# Patient Record
Sex: Female | Born: 1950
Health system: Southern US, Community
[De-identification: ages and names within clinical notes are randomized; demographics above are authoritative.]

## PROBLEM LIST (undated history)

## (undated) DIAGNOSIS — R7989 Other specified abnormal findings of blood chemistry: Secondary | ICD-10-CM

## (undated) DIAGNOSIS — J45909 Unspecified asthma, uncomplicated: Secondary | ICD-10-CM

## (undated) DIAGNOSIS — M545 Low back pain, unspecified: Secondary | ICD-10-CM

## (undated) DIAGNOSIS — E785 Hyperlipidemia, unspecified: Secondary | ICD-10-CM

## (undated) DIAGNOSIS — R945 Abnormal results of liver function studies: Secondary | ICD-10-CM

## (undated) DIAGNOSIS — M25561 Pain in right knee: Secondary | ICD-10-CM

## (undated) DIAGNOSIS — K219 Gastro-esophageal reflux disease without esophagitis: Secondary | ICD-10-CM

## (undated) DIAGNOSIS — N924 Excessive bleeding in the premenopausal period: Secondary | ICD-10-CM

## (undated) DIAGNOSIS — D509 Iron deficiency anemia, unspecified: Secondary | ICD-10-CM

## (undated) DIAGNOSIS — L83 Acanthosis nigricans: Secondary | ICD-10-CM

## (undated) DIAGNOSIS — Z8742 Personal history of other diseases of the female genital tract: Secondary | ICD-10-CM

## (undated) DIAGNOSIS — R7303 Prediabetes: Secondary | ICD-10-CM

## (undated) DIAGNOSIS — K259 Gastric ulcer, unspecified as acute or chronic, without hemorrhage or perforation: Secondary | ICD-10-CM

## (undated) DIAGNOSIS — I10 Essential (primary) hypertension: Secondary | ICD-10-CM

## (undated) DIAGNOSIS — K254 Chronic or unspecified gastric ulcer with hemorrhage: Secondary | ICD-10-CM

## (undated) DIAGNOSIS — E669 Obesity, unspecified: Secondary | ICD-10-CM

## (undated) DIAGNOSIS — N840 Polyp of corpus uteri: Secondary | ICD-10-CM

## (undated) DIAGNOSIS — K635 Polyp of colon: Secondary | ICD-10-CM

## (undated) HISTORY — DX: Unspecified asthma, uncomplicated: J45.909

## (undated) HISTORY — DX: Hyperlipidemia, unspecified: E78.5

## (undated) HISTORY — DX: Excessive bleeding in the premenopausal period: N92.4

## (undated) HISTORY — DX: Acanthosis nigricans: L83

## (undated) HISTORY — DX: Iron deficiency anemia, unspecified: D50.9

## (undated) HISTORY — DX: Low back pain, unspecified: M54.50

## (undated) HISTORY — DX: Personal history of other diseases of the female genital tract: Z87.42

## (undated) HISTORY — DX: Polyp of colon: K63.5

## (undated) HISTORY — DX: Essential (primary) hypertension: I10

## (undated) HISTORY — DX: Low back pain: M54.5

## (undated) HISTORY — DX: Pain in right knee: M25.561

## (undated) HISTORY — DX: Chronic or unspecified gastric ulcer with hemorrhage: K25.4

## (undated) HISTORY — DX: Other specified abnormal findings of blood chemistry: R79.89

## (undated) HISTORY — DX: Polyp of corpus uteri: N84.0

## (undated) HISTORY — DX: Obesity, unspecified: E66.9

## (undated) HISTORY — DX: Gastric ulcer, unspecified as acute or chronic, without hemorrhage or perforation: K25.9

## (undated) HISTORY — DX: Abnormal results of liver function studies: R94.5

## (undated) HISTORY — PX: COLONOSCOPY: SHX174

---

## 1973-06-03 HISTORY — PX: THERAPEUTIC ABORTION: SHX798

## 1999-02-06 ENCOUNTER — Inpatient Hospital Stay (HOSPITAL_COMMUNITY): Admission: AD | Admit: 1999-02-06 | Discharge: 1999-02-06 | Payer: Self-pay | Admitting: *Deleted

## 1999-03-01 ENCOUNTER — Other Ambulatory Visit: Admission: RE | Admit: 1999-03-01 | Discharge: 1999-03-01 | Payer: Self-pay | Admitting: *Deleted

## 1999-03-01 ENCOUNTER — Encounter: Admission: RE | Admit: 1999-03-01 | Discharge: 1999-03-01 | Payer: Self-pay | Admitting: Obstetrics

## 1999-03-13 ENCOUNTER — Encounter: Admission: RE | Admit: 1999-03-13 | Discharge: 1999-03-13 | Payer: Self-pay | Admitting: Obstetrics & Gynecology

## 2000-12-12 ENCOUNTER — Encounter: Payer: Self-pay | Admitting: Emergency Medicine

## 2000-12-12 ENCOUNTER — Emergency Department (HOSPITAL_COMMUNITY): Admission: EM | Admit: 2000-12-12 | Discharge: 2000-12-12 | Payer: Self-pay | Admitting: Emergency Medicine

## 2001-05-28 ENCOUNTER — Encounter: Admission: RE | Admit: 2001-05-28 | Discharge: 2001-05-28 | Payer: Self-pay

## 2001-06-23 ENCOUNTER — Encounter: Admission: RE | Admit: 2001-06-23 | Discharge: 2001-06-23 | Payer: Self-pay | Admitting: Obstetrics & Gynecology

## 2001-06-24 ENCOUNTER — Other Ambulatory Visit: Admission: RE | Admit: 2001-06-24 | Discharge: 2001-06-24 | Payer: Self-pay | Admitting: Obstetrics

## 2001-06-25 ENCOUNTER — Encounter: Admission: RE | Admit: 2001-06-25 | Discharge: 2001-06-25 | Payer: Self-pay | Admitting: Internal Medicine

## 2002-05-10 ENCOUNTER — Encounter: Admission: RE | Admit: 2002-05-10 | Discharge: 2002-05-10 | Payer: Self-pay | Admitting: Internal Medicine

## 2003-01-26 ENCOUNTER — Encounter: Admission: RE | Admit: 2003-01-26 | Discharge: 2003-01-26 | Payer: Self-pay | Admitting: Internal Medicine

## 2003-02-01 ENCOUNTER — Encounter: Admission: RE | Admit: 2003-02-01 | Discharge: 2003-02-01 | Payer: Self-pay | Admitting: Internal Medicine

## 2004-03-29 ENCOUNTER — Ambulatory Visit: Payer: Self-pay | Admitting: Internal Medicine

## 2004-04-16 ENCOUNTER — Ambulatory Visit: Payer: Self-pay | Admitting: Internal Medicine

## 2004-04-23 ENCOUNTER — Ambulatory Visit: Payer: Self-pay | Admitting: Internal Medicine

## 2004-05-15 ENCOUNTER — Ambulatory Visit (HOSPITAL_COMMUNITY): Admission: RE | Admit: 2004-05-15 | Discharge: 2004-05-15 | Payer: Self-pay | Admitting: Internal Medicine

## 2004-06-18 ENCOUNTER — Ambulatory Visit: Payer: Self-pay | Admitting: Internal Medicine

## 2004-11-05 ENCOUNTER — Ambulatory Visit: Payer: Self-pay | Admitting: Internal Medicine

## 2005-04-05 ENCOUNTER — Encounter (INDEPENDENT_AMBULATORY_CARE_PROVIDER_SITE_OTHER): Payer: Self-pay | Admitting: Ophthalmology

## 2005-04-05 ENCOUNTER — Ambulatory Visit: Payer: Self-pay | Admitting: Internal Medicine

## 2005-04-05 LAB — CONVERTED CEMR LAB: Pap Smear: NORMAL

## 2005-04-09 ENCOUNTER — Ambulatory Visit: Payer: Self-pay | Admitting: Internal Medicine

## 2005-04-11 ENCOUNTER — Ambulatory Visit: Payer: Self-pay | Admitting: Internal Medicine

## 2005-05-07 ENCOUNTER — Ambulatory Visit: Payer: Self-pay | Admitting: *Deleted

## 2005-05-07 ENCOUNTER — Other Ambulatory Visit: Admission: RE | Admit: 2005-05-07 | Discharge: 2005-05-07 | Payer: Self-pay | Admitting: Obstetrics and Gynecology

## 2005-05-07 ENCOUNTER — Encounter (INDEPENDENT_AMBULATORY_CARE_PROVIDER_SITE_OTHER): Payer: Self-pay | Admitting: Specialist

## 2005-05-10 ENCOUNTER — Encounter (INDEPENDENT_AMBULATORY_CARE_PROVIDER_SITE_OTHER): Payer: Self-pay | Admitting: *Deleted

## 2005-05-10 ENCOUNTER — Ambulatory Visit: Payer: Self-pay | Admitting: Internal Medicine

## 2005-05-10 LAB — HM COLONOSCOPY: HM Colonoscopy: ABNORMAL

## 2005-05-20 ENCOUNTER — Ambulatory Visit: Payer: Self-pay | Admitting: Internal Medicine

## 2005-07-16 ENCOUNTER — Ambulatory Visit (HOSPITAL_COMMUNITY): Admission: RE | Admit: 2005-07-16 | Discharge: 2005-07-16 | Payer: Self-pay | Admitting: Internal Medicine

## 2005-10-22 ENCOUNTER — Ambulatory Visit: Payer: Self-pay | Admitting: Internal Medicine

## 2006-03-11 ENCOUNTER — Ambulatory Visit: Payer: Self-pay | Admitting: Internal Medicine

## 2006-03-25 ENCOUNTER — Ambulatory Visit: Payer: Self-pay | Admitting: Internal Medicine

## 2006-03-25 ENCOUNTER — Encounter (INDEPENDENT_AMBULATORY_CARE_PROVIDER_SITE_OTHER): Payer: Self-pay | Admitting: Ophthalmology

## 2006-03-25 LAB — CONVERTED CEMR LAB
BUN: 13 mg/dL (ref 6–23)
CO2: 29 meq/L (ref 19–32)
Calcium: 9.3 mg/dL (ref 8.4–10.5)
Chloride: 104 meq/L (ref 96–112)
Cholesterol: 197 mg/dL (ref 0–200)
Creatinine, Ser: 0.96 mg/dL (ref 0.40–1.20)
Ferritin: 43 ng/mL (ref 10–291)
Glucose, Bld: 93 mg/dL (ref 70–99)
HCT: 38.9 % (ref 36.0–46.0)
HDL: 55 mg/dL (ref >39)
Hemoglobin: 12.5 g/dL (ref 12.0–15.0)
LDL Cholesterol: 115 mg/dL — ABNORMAL HIGH (ref 0–99)
Leukocyte count, blood: 5.8 10*9/L (ref 4.0–10.5)
MCHC: 32.1 g/dL (ref 30.0–36.0)
MCV: 88.8 fL (ref 78.0–100.0)
Platelets: 306 10*3/uL (ref 150–400)
Potassium: 4.2 meq/L (ref 3.5–5.3)
RBC: 4.38 M/uL (ref 3.87–5.11)
RDW: 14.4 % — ABNORMAL HIGH (ref 11.5–14.0)
Sodium: 141 meq/L (ref 135–145)
Total CHOL/HDL Ratio: 3.6
Triglycerides: 135 mg/dL (ref <150)
VLDL: 27 mg/dL (ref 0–40)

## 2006-05-22 ENCOUNTER — Encounter (INDEPENDENT_AMBULATORY_CARE_PROVIDER_SITE_OTHER): Payer: Self-pay | Admitting: Ophthalmology

## 2006-05-22 DIAGNOSIS — E785 Hyperlipidemia, unspecified: Secondary | ICD-10-CM | POA: Insufficient documentation

## 2006-05-22 DIAGNOSIS — Z862 Personal history of diseases of the blood and blood-forming organs and certain disorders involving the immune mechanism: Secondary | ICD-10-CM

## 2006-05-22 DIAGNOSIS — I10 Essential (primary) hypertension: Secondary | ICD-10-CM

## 2006-05-22 DIAGNOSIS — Z6841 Body Mass Index (BMI) 40.0 and over, adult: Secondary | ICD-10-CM

## 2006-05-22 DIAGNOSIS — N949 Unspecified condition associated with female genital organs and menstrual cycle: Secondary | ICD-10-CM

## 2007-04-09 ENCOUNTER — Ambulatory Visit: Payer: Self-pay | Admitting: Internal Medicine

## 2007-04-09 LAB — CONVERTED CEMR LAB: Blood Glucose, Fingerstick: 110

## 2007-05-06 ENCOUNTER — Ambulatory Visit (HOSPITAL_COMMUNITY): Admission: RE | Admit: 2007-05-06 | Discharge: 2007-05-06 | Payer: Self-pay | Admitting: Internal Medicine

## 2007-06-10 ENCOUNTER — Ambulatory Visit: Payer: Self-pay | Admitting: Obstetrics and Gynecology

## 2007-06-16 ENCOUNTER — Ambulatory Visit (HOSPITAL_COMMUNITY): Admission: RE | Admit: 2007-06-16 | Discharge: 2007-06-16 | Payer: Self-pay | Admitting: Obstetrics & Gynecology

## 2007-07-14 ENCOUNTER — Encounter: Payer: Self-pay | Admitting: Obstetrics and Gynecology

## 2007-07-14 ENCOUNTER — Ambulatory Visit: Payer: Self-pay | Admitting: Obstetrics and Gynecology

## 2007-07-14 ENCOUNTER — Ambulatory Visit (HOSPITAL_COMMUNITY): Admission: RE | Admit: 2007-07-14 | Discharge: 2007-07-14 | Payer: Self-pay | Admitting: Obstetrics and Gynecology

## 2007-07-30 ENCOUNTER — Ambulatory Visit: Payer: Self-pay | Admitting: Obstetrics and Gynecology

## 2008-03-10 ENCOUNTER — Telehealth (INDEPENDENT_AMBULATORY_CARE_PROVIDER_SITE_OTHER): Payer: Self-pay | Admitting: Internal Medicine

## 2008-03-15 ENCOUNTER — Telehealth (INDEPENDENT_AMBULATORY_CARE_PROVIDER_SITE_OTHER): Payer: Self-pay | Admitting: Internal Medicine

## 2008-04-04 ENCOUNTER — Encounter: Payer: Self-pay | Admitting: Internal Medicine

## 2008-04-04 ENCOUNTER — Ambulatory Visit: Payer: Self-pay | Admitting: *Deleted

## 2008-04-04 ENCOUNTER — Encounter (INDEPENDENT_AMBULATORY_CARE_PROVIDER_SITE_OTHER): Payer: Self-pay | Admitting: Internal Medicine

## 2008-04-04 DIAGNOSIS — M5416 Radiculopathy, lumbar region: Secondary | ICD-10-CM | POA: Insufficient documentation

## 2008-04-06 LAB — CONVERTED CEMR LAB
ALT: 30 units/L (ref 0–35)
AST: 28 units/L (ref 0–37)
Albumin: 4 g/dL (ref 3.5–5.2)
Alkaline Phosphatase: 79 units/L (ref 39–117)
BUN: 13 mg/dL (ref 6–23)
Basophils Absolute: 0 10*3/uL (ref 0.0–0.1)
Basophils Relative: 0 % (ref 0–1)
CO2: 26 meq/L (ref 19–32)
Calcium: 9.4 mg/dL (ref 8.4–10.5)
Chloride: 102 meq/L (ref 96–112)
Cholesterol: 212 mg/dL — ABNORMAL HIGH (ref 0–200)
Creatinine, Ser: 0.89 mg/dL (ref 0.40–1.20)
Eosinophils Absolute: 0.3 10*3/uL (ref 0.0–0.7)
Eosinophils Relative: 4 % (ref 0–5)
Glucose, Bld: 104 mg/dL — ABNORMAL HIGH (ref 70–99)
HCT: 37.2 % (ref 36.0–46.0)
HDL: 52 mg/dL (ref >39)
Hemoglobin: 11.5 g/dL — ABNORMAL LOW (ref 12.0–15.0)
LDL Cholesterol: 133 mg/dL — ABNORMAL HIGH (ref 0–99)
Lymphocytes Relative: 34 % (ref 12–46)
Lymphs Abs: 2.4 10*3/uL (ref 0.7–4.0)
MCHC: 30.9 g/dL (ref 30.0–36.0)
MCV: 89.6 fL (ref 78.0–100.0)
Monocytes Absolute: 0.6 10*3/uL (ref 0.1–1.0)
Monocytes Relative: 9 % (ref 3–12)
Neutro Abs: 3.7 10*3/uL (ref 1.7–7.7)
Neutrophils Relative %: 52 % (ref 43–77)
Platelets: 279 10*3/uL (ref 150–400)
Potassium: 3.8 meq/L (ref 3.5–5.3)
RBC: 4.15 M/uL (ref 3.87–5.11)
RDW: 14.6 % (ref 11.5–15.5)
Sodium: 142 meq/L (ref 135–145)
TSH: 1.839 microintl units/mL (ref 0.350–4.50)
Total Bilirubin: 0.6 mg/dL (ref 0.3–1.2)
Total CHOL/HDL Ratio: 4.1
Total Protein: 6.9 g/dL (ref 6.0–8.3)
Triglycerides: 134 mg/dL (ref <150)
VLDL: 27 mg/dL (ref 0–40)
WBC: 7 10*3/uL (ref 4.0–10.5)

## 2008-05-13 ENCOUNTER — Ambulatory Visit (HOSPITAL_COMMUNITY): Admission: RE | Admit: 2008-05-13 | Discharge: 2008-05-13 | Payer: Self-pay | Admitting: Internal Medicine

## 2008-09-01 ENCOUNTER — Encounter (INDEPENDENT_AMBULATORY_CARE_PROVIDER_SITE_OTHER): Payer: Self-pay | Admitting: Internal Medicine

## 2008-09-01 ENCOUNTER — Ambulatory Visit: Payer: Self-pay | Admitting: Internal Medicine

## 2008-09-01 LAB — HM PAP SMEAR: HM Pap smear: NEGATIVE

## 2008-09-01 LAB — CONVERTED CEMR LAB: Pap Smear: ABSENT

## 2008-09-05 LAB — CONVERTED CEMR LAB
Cholesterol: 219 mg/dL — ABNORMAL HIGH (ref 0–200)
HDL: 56 mg/dL (ref >39)
LDL Cholesterol: 143 mg/dL — ABNORMAL HIGH (ref 0–99)
Total CHOL/HDL Ratio: 3.9
Triglycerides: 98 mg/dL (ref <150)
VLDL: 20 mg/dL (ref 0–40)

## 2008-09-06 ENCOUNTER — Telehealth (INDEPENDENT_AMBULATORY_CARE_PROVIDER_SITE_OTHER): Payer: Self-pay | Admitting: Internal Medicine

## 2008-12-12 ENCOUNTER — Encounter (INDEPENDENT_AMBULATORY_CARE_PROVIDER_SITE_OTHER): Payer: Self-pay | Admitting: Internal Medicine

## 2008-12-12 ENCOUNTER — Ambulatory Visit: Payer: Self-pay | Admitting: Internal Medicine

## 2008-12-12 LAB — CONVERTED CEMR LAB
BUN: 13 mg/dL (ref 6–23)
CO2: 26 meq/L (ref 19–32)
Calcium: 9.3 mg/dL (ref 8.4–10.5)
Chloride: 103 meq/L (ref 96–112)
Cholesterol: 188 mg/dL (ref 0–200)
Creatinine, Ser: 0.89 mg/dL (ref 0.40–1.20)
Ferritin: 67 ng/mL (ref 10–291)
Glucose, Bld: 91 mg/dL (ref 70–99)
HCT: 39 % (ref 36.0–46.0)
HDL: 57 mg/dL (ref >39)
Hemoglobin: 12.6 g/dL (ref 12.0–15.0)
Iron: 62 ug/dL (ref 42–145)
LDL Cholesterol: 112 mg/dL — ABNORMAL HIGH (ref 0–99)
MCHC: 32.3 g/dL (ref 30.0–36.0)
MCV: 88.4 fL (ref 78.0–100.0)
Platelets: 287 10*3/uL (ref 150–400)
Potassium: 3.8 meq/L (ref 3.5–5.3)
RBC: 4.41 M/uL (ref 3.87–5.11)
RDW: 14.5 % (ref 11.5–15.5)
Sodium: 141 meq/L (ref 135–145)
TSH: 1.971 microintl units/mL (ref 0.350–4.500)
Total CHOL/HDL Ratio: 3.3
Triglycerides: 97 mg/dL (ref <150)
VLDL: 19 mg/dL (ref 0–40)
WBC: 8 10*3/uL (ref 4.0–10.5)

## 2009-04-10 ENCOUNTER — Telehealth (INDEPENDENT_AMBULATORY_CARE_PROVIDER_SITE_OTHER): Payer: Self-pay | Admitting: Internal Medicine

## 2009-05-01 ENCOUNTER — Telehealth (INDEPENDENT_AMBULATORY_CARE_PROVIDER_SITE_OTHER): Payer: Self-pay | Admitting: Internal Medicine

## 2009-06-12 ENCOUNTER — Ambulatory Visit: Payer: Self-pay | Admitting: Internal Medicine

## 2009-06-12 LAB — CONVERTED CEMR LAB
ALT: 27 units/L (ref 0–35)
AST: 29 units/L (ref 0–37)
Albumin: 4 g/dL (ref 3.5–5.2)
Alkaline Phosphatase: 79 units/L (ref 39–117)
BUN: 11 mg/dL (ref 6–23)
CO2: 24 meq/L (ref 19–32)
Calcium: 9.2 mg/dL (ref 8.4–10.5)
Chloride: 106 meq/L (ref 96–112)
Cholesterol: 195 mg/dL (ref 0–200)
Creatinine, Ser: 0.95 mg/dL (ref 0.40–1.20)
Ferritin: 50 ng/mL (ref 10–291)
Glucose, Bld: 94 mg/dL (ref 70–99)
HCT: 39.4 % (ref 36.0–46.0)
HDL: 57 mg/dL (ref >39)
Hemoglobin: 13.1 g/dL (ref 12.0–15.0)
Hgb A1c MFr Bld: 6.2 %
Iron: 54 ug/dL (ref 42–145)
LDL Cholesterol: 119 mg/dL — ABNORMAL HIGH (ref 0–99)
MCHC: 33.2 g/dL (ref 30.0–36.0)
MCV: 88.1 fL (ref >=78.0)
Platelets: 286 10*3/uL (ref 150–400)
Potassium: 4.1 meq/L (ref 3.5–5.3)
RBC: 4.47 M/uL (ref 3.87–5.11)
RDW: 14.3 % (ref 11.5–15.5)
Sodium: 144 meq/L (ref 135–145)
Total Bilirubin: 0.5 mg/dL (ref 0.3–1.2)
Total CHOL/HDL Ratio: 3.4
Total Protein: 7.1 g/dL (ref 6.0–8.3)
Triglycerides: 93 mg/dL (ref <150)
VLDL: 19 mg/dL (ref 0–40)
WBC: 7.3 10*3/uL (ref 4.0–10.5)

## 2009-07-25 ENCOUNTER — Ambulatory Visit (HOSPITAL_COMMUNITY): Admission: RE | Admit: 2009-07-25 | Discharge: 2009-07-25 | Payer: Self-pay | Admitting: Internal Medicine

## 2009-07-25 LAB — HM MAMMOGRAPHY: HM Mammogram: NEGATIVE

## 2010-03-14 ENCOUNTER — Ambulatory Visit: Payer: Self-pay | Admitting: Internal Medicine

## 2010-03-14 DIAGNOSIS — R1031 Right lower quadrant pain: Secondary | ICD-10-CM

## 2010-03-14 DIAGNOSIS — M542 Cervicalgia: Secondary | ICD-10-CM

## 2010-03-14 HISTORY — DX: Right lower quadrant pain: R10.31

## 2010-06-05 ENCOUNTER — Encounter: Payer: Self-pay | Admitting: Internal Medicine

## 2010-07-03 NOTE — Assessment & Plan Note (Signed)
Summary: EST-CK/FU/MED/CFB   Vital Signs:  Patient profile:   60 year old female Height:      66 inches (167.64 cm) Weight:      313.5 pounds (142.50 kg) BMI:     50.78 Temp:     97.4 degrees F (36.33 degrees C) oral Pulse rate:   80 / minute BP sitting:   136 / 85  (left arm) Cuff size:   WRIST/LG  Vitals Entered By: Theotis Barrio NT II (March 14, 2010 3:02 PM) CC: MEDICATION REFILL   /    RIGHT KNEE ACHE  / BACK OF NECK FOR ABOUT 2 WEEKS Is Patient Diabetic? No Pain Assessment Patient in pain? no      Nutritional Status BMI of > 30 = obese  Have you ever been in a relationship where you felt threatened, hurt or afraid?No   Does patient need assistance? Functional Status Self care Ambulation Normal   CC:  MEDICATION REFILL   /    RIGHT KNEE ACHE  / BACK OF NECK FOR ABOUT 2 WEEKS.  History of Present Illness: This is a 60 year old with a hx of htn, hyperlipidemia, and obesity who presents for routine follow up.   Pt last seen on 1/11 and had lost 12 ibs at that time with diet and exercise (weight at that time - 304 ibs now 313).   Pt is concerned about pain across her neck radiating over her shoulder blades.  Pt gets some relief when she props pillows behind her neck while sleeping.  The pain is mild and dull and has been occuring for the last two weeks.  Pt denies any numbness tingling, or loss of strength in her upper extremities.  Over the last 2.5 months, the patient has also experienced worsening right knee pain. The patient denies any history of trauma or knee swelling.  Pain is improved with rest and is worsened with weight bearing.  Pain improves with increased movement. The patient's pain is discribed as sharp 3-4 out of 10 in intensity and radiates down to the foot.  Pt states that she has gained weight over the last several months because she is unable to walk secondary to the pain.     Preventive Screening-Counseling & Management  Alcohol-Tobacco     Alcohol  drinks/day: 0     Smoking Status: quit     Year Quit: quit when 60yrs old  Caffeine-Diet-Exercise     Does Patient Exercise: no     Type of exercise: walking sometimes  Allergies: No Known Drug Allergies  Past History:  Past Medical History: Last updated: 05/22/2006 Anemia-iron deficiency    - post-menopausal bleeding; followed by Dr. Okey Dupre; hx of benign endometrial polyp Hyperlipidemia    - no pharmacotherapy    - w/ lifestyle changes LDL 147 --> 115 Hypertension Obesity Allergic rhinitis Hx of benign endometrial polyp 05/2005 Hx elevated LFT's, resolved    - Etiology unclear    - Hepatitis B, C serologies negative, 2005 Hx of colonic polyps, 05/2005    - Hyperplastic, NO adenomatous or malignant change found Hx acanthosis nigricans, posterior neck  Hx UTI Hx BV ([place]) Hx abnormal pap (1980's)    - close follow up    - Paps now normal (2006)  Family History: Last updated: 04/09/2007 Family History Hypertension Family History of Alcoholism/Addiction  Social History: Last updated: 04/09/2007 Occupation: IT consultant Divorced Former Smoker:10 yrs Alcohol use-no Drug use-no Regular exercise-no  Review of Systems  No fever, chills, no change in BM's, no changes in vision.   Physical Exam  General:  alert and well-developed.   Head:  normocephalic and atraumatic.   Eyes:  vision grossly intact, pupils equal, pupils round, and pupils reactive to light.   Ears:  R ear normal and L ear normal.   Nose:  no external deformity, nose piercing noted, and no external erythema.   Mouth:  pharynx pink and moist.   Neck:  supple and full ROM.   Chest Wall:  no tenderness.   Lungs:  normal respiratory effort, no accessory muscle use, normal breath sounds, no crackles, and no wheezes.   Heart:  normal rate, regular rhythm, no murmur, and no gallop.   Abdomen:  soft, non-tender, and normal bowel sounds.   Msk:  There is no tenderness to palpatoin along the joint  space of either patella. There is no pain with either active or passive motion of either knee.  5/5 muscle strength in all extremities.  Pulses:  2+ dp/pt pulses  Extremities:  No edema Skin:  No rashes   Impression & Recommendations:  Problem # 1:  OBESITY (ICD-278.00) Pt has gained 9 pounds since her last visit.  The patient has been counseled extensively regarding the importance of weight loss and the health effects of obesity.  The patient has agreed start working out at [employer] again with plans to start with water aerobics given her current kneed pain.  The patient has been trying to eat a healthy diet but plans to continue to work on this.    Problem # 2:  KNEE PAIN, RIGHT (ICD-719.46) Pt has not experienced any warning signs such as fever or chills and a x ray is not felt to be needed at this time.  The pts pain is most likely secondary to osteoarthritis given her history and body habitus.  At this point, the patient was started on 650 mg tylenol twice daily to help improve her pain, but the patient was informed that activity and weight loss would likely provide the greatest relief.  Her updated medication list for this problem includes:    Acetaminophen 650 Mg Cr-tabs (Acetaminophen) .Marland Kitchen... Take 1 tablet by mouth two times a day  Problem # 3:  Hx of NECK AND BACK PAIN (ICD-723.1) This has been ongoing for about two weeks and pt feels that it is stable with some improvement with warm compresses.  Pts neurologic examination was normal with normal sensation and no loss of strength.  At this time we will continue to monitor and pt will continue to use warm compresses as needed as this appears to be musculoskeletal pain.  Her updated medication list for this problem includes:    Acephen 650 Mg Supp (Acetaminophen) .Marland Kitchen... Take 1 tablet by mouth two times a day  Problem # 4:  HYPERTENSION (ICD-401.9) Pts blood pressure was under good control today so we will contiue her current dose of HCTZ.     Her updated medication list for this problem includes:    Hydrochlorothiazide 25 Mg Tabs (Hydrochlorothiazide) .Marland Kitchen... Take 1 tablet by mouth once a day  Problem # 5:  PREVENTIVE HEALTH CARE (ICD-V70.0) Pt recieved a flu vaccine today.  [name] is also due for a colonoscopy but is unwilling to have one at this point in time and wishes to readress the issue next year. Orders: Flu Vaccine 57yrs + ([address])  Complete Medication List: 1)  Hydrochlorothiazide 25 Mg Tabs (Hydrochlorothiazide) .... Take 1  tablet by mouth once a day 2)  Aspirin 81 Mg Tbec (Aspirin) .... Take 1 tablet by mouth once a day 3)  Multivitamins Tabs (Multiple vitamin) .... Take 1 tablet by mouth once a day 4)  Pravachol 40 Mg Tabs (Pravastatin sodium) .... Take one tablet daily to lower cholesterol. 5)  Acetaminophen 650 Mg Cr-tabs (Acetaminophen) .... Take 1 tablet by mouth two times a day  Other Orders: Admin 1st Vaccine ([address])  Patient Instructions: 1)  Please take your tylenol as prescribed for your knee pain.  You can use a warm compress on your back.  For the greatest improvment in your knee pain you should continue to attmept to lose weight.  This can best be done by remaining active (water arobics if knee pain) and watching your diet.  Prescriptions: ASPIRIN 81 MG TBEC (ASPIRIN) Take 1 tablet by mouth once a day  #32 x 11   Entered and Authorized by:   Sinda Du MD   Signed by:   Sinda Du MD on 03/14/2010   Method used:   Electronically to        [facility] [address] [phone]* (retail)       [address]       [place], [place]  [address]       Ph: [phone] or [phone]       Fax: [phone]   RxID:   [id number] PRAVACHOL 40 MG TABS (PRAVASTATIN SODIUM) Take one tablet daily to lower cholesterol.  #32 x 11   Entered and Authorized by:   Sinda Du MD   Signed by:   Sinda Du MD on 03/14/2010   Method used:   [facility]        [facility] [address] [phone]* (retail)       [address]       [place], [place]  [address]       Ph: [phone] or [phone]       Fax: [phone]   RxID:   [id number] HYDROCHLOROTHIAZIDE 25 MG TABS (HYDROCHLOROTHIAZIDE) Take 1 tablet by mouth once a day  #32 x 11   Entered and Authorized by:   Sinda Du MD   Signed by:   Sinda Du MD on 03/14/2010   Method used:   [facility]        [facility] [address] [phone]* (retail)       [address]       [place], [place]  [address]       Ph: [phone] or [phone]       Fax: [phone]   RxID:   [id number] ACEPHEN 650 MG SUPP (ACETAMINOPHEN) Take 1 tablet by mouth two times a day  #90 x 2   Entered and Authorized by:   Sinda Du MD   Signed by:   Sinda Du MD on 03/14/2010   Method used:   [facility]ron[facility] to        [facility] [address] [phone]* (retail)       [address]       [place], [place]  [address]       Ph: [phone] or [phone]       Fax: [phone]   RxID:   [id number]   Prevention & Chronic Care Immunizations   Influenza vaccine:  Fluvax 3+  (03/14/2010)    Tetanus booster: Not documented    Pneumococcal vaccine: Not documented  Colorectal Screening   Hemoccult: Not documented    Colonoscopy: Abnormal  (05/10/2005)  Other Screening   Pap smear: transformation zone absent.  repeat in one year.  (09/01/2008)   Pap smear due: 09/18/2011    Mammogram: ASSESSMENT: Negative - BI-RADS 1^MS DIGITAL SCREENING  (07/25/2009)   Mammogram action/deferral: Ordered  (06/12/2009)   Smoking status: quit  (03/14/2010)  Lipids   Total Cholesterol: 195  (06/12/2009)   Lipid panel action/deferral: Lipid Panel ordered   LDL: 119  (06/12/2009)   LDL Direct: Not documented   HDL: 57  (06/12/2009)   Triglycerides: 93  (06/12/2009)    SGOT (AST): 29  (06/12/2009)   BMP action: Ordered   SGPT (ALT): 27   (06/12/2009)   Alkaline phosphatase: 79  (06/12/2009)   Total bilirubin: 0.5  (06/12/2009)  Hypertension   Last Blood Pressure: 136 / 85  (03/14/2010)   Serum creatinine: 0.95  (06/12/2009)   BMP action: Ordered   Serum potassium 4.1  (06/12/2009)  Self-Management Support :   Personal Goals (by the next clinic visit) :      Personal blood pressure goal: 140/90  (03/14/2010)     Personal LDL goal: 100  (03/14/2010)    Patient will work on the following items until the next clinic visit to reach self-care goals:     Medications and monitoring: take my medicines every day, bring all of my medications to every visit  (03/14/2010)     Eating: drink diet soda or water instead of juice or soda, eat more vegetables, use fresh or frozen vegetables, eat foods that are low in salt, eat baked foods instead of fried foods, eat fruit for snacks and desserts, limit or avoid alcohol  (03/14/2010)     Activity: take a 30 minute walk every day  (03/14/2010)    Hypertension self-management support: Resources for patients handout  (03/14/2010)    Lipid self-management support: Resources for patients handout  (03/14/2010)        Resource handout printed. Flu Vaccine Consent Questions     Do you have a history of severe allergic reactions to this vaccine? no    Any prior history of allergic reactions to egg and/or gelatin? no    Do you have a sensitivity to the preservative Thimersol? no    Do you have a past history of Guillan-Barre Syndrome? no    Do you currently have an acute febrile illness? no    Have you ever had a severe reaction to latex? no    Vaccine information given and explained to patient? yes    Are you currently pregnant? no    Lot Number:[id number]   Exp Date:12/01/2010   Manufacturer: [employer]    Site Given right Deltoid IM.Cynda Familia [facility])  March 14, 2010 4:58 PM    Resource handout printed.   Marland Kitchenopcflu

## 2010-07-03 NOTE — Assessment & Plan Note (Signed)
Summary: EST-ROUTINE CHECKUP/CH   Vital Signs:  Patient profile:   60 year old female Height:      66 inches (167.64 cm) Weight:      304.3 pounds (138.32 kg) BMI:     49.29 Temp:     97.5 degrees F (36.39 degrees C) oral Pulse rate:   80 / minute BP sitting:   140 / 96  (right arm)  Vitals Entered By: Stanton Kidney Ditzler RN (June 12, 2009 10:55 AM) Is Patient Diabetic? No Pain Assessment Patient in pain? no      Nutritional Status BMI of > 30 = obese Nutritional Status Detail appetite good  Have you ever been in a relationship where you felt threatened, hurt or afraid?denies   Does patient need assistance? Functional Status Self care Ambulation Normal Comments Ck-up and refills on meds.   History of Present Illness: This is a  year old woman with past medical history listed below who is here for check up and for refills on medications.  1) she has lost 12 pounds over the last 6 months!!!  Has been walking 3x weekly and watching her diet tightly.  2) no acute complaints     Depression History:      The patient denies a depressed mood most of the day and a diminished interest in her usual daily activities.         Preventive Screening-Counseling & Management  Alcohol-Tobacco     Alcohol drinks/day: 0     Smoking Status: quit     Year Quit: quit when 60yrs old  Caffeine-Diet-Exercise     Does Patient Exercise: no     Type of exercise: walking sometimes  Current Medications (verified): 1)  Hydrochlorothiazide 25 Mg Tabs (Hydrochlorothiazide) .... Take 1 Tablet By Mouth Once A Day 2)  Aspirin 81 Mg Tbec (Aspirin) .... Take 1 Tablet By Mouth Once A Day 3)  Multivitamins  Tabs (Multiple Vitamin) .... Take 1 Tablet By Mouth Once A Day 4)  Pravachol 40 Mg Tabs (Pravastatin Sodium) .... Take One Tablet Daily To Lower Cholesterol.  Allergies (verified): No Known Drug Allergies  Review of Systems       per hpi  Physical Exam  General:  alert and  overweight-appearing.   Lungs:  normal respiratory effort and normal breath sounds.   Heart:  normal rate, regular rhythm, and no murmur.   Pulses:  +2 Extremities:  no edema Neurologic:  alert & oriented X3, cranial nerves II-XII intact, and strength normal in all extremities.   Psych:  Oriented X3, memory intact for recent and remote, normally interactive, and good eye contact.     Impression & Recommendations:  Problem # 1:  HYPERTENSION (ICD-401.9) BP is borderline today, no changes right now.  Her updated medication list for this problem includes:    Hydrochlorothiazide 25 Mg Tabs (Hydrochlorothiazide) .Marland Kitchen... Take 1 tablet by mouth once a day  BP today: 140/96 Prior BP: 130/86 (12/12/2008)  Labs Reviewed: K+: 3.8 (12/12/2008) Creat: : 0.89 (12/12/2008)   Chol: 188 (12/12/2008)   HDL: 57 (12/12/2008)   LDL: 112 (12/12/2008)   TG: 97 (12/12/2008)  Problem # 2:  HYPERLIPIDEMIA (ICD-272.4) CMET and lipids today.  Her updated medication list for this problem includes:    Pravachol 40 Mg Tabs (Pravastatin sodium) .Marland Kitchen... Take one tablet daily to lower cholesterol.  Orders: T-Lipid Profile ([id number])  Problem # 3:  OBESITY (ICD-278.00) congratulated on weight loss and lifestyle changes. encouraged her to continue with new routine.  Orders: T-Hgb A1C (in-house) ([id number])  Problem # 4:  ANEMIA-IRON DEFICIENCY (ICD-280.9) Will check CBC and iron studies today.  Orders: T-CBC No Diff ([id number]) T-Ferritin ([id number]) T-Iron ([id number])  Complete Medication List: 1)  Hydrochlorothiazide 25 Mg Tabs (Hydrochlorothiazide) .... Take 1 tablet by mouth once a day 2)  Aspirin 81 Mg Tbec (Aspirin) .... Take 1 tablet by mouth once a day 3)  Multivitamins Tabs (Multiple vitamin) .... Take 1 tablet by mouth once a day 4)  Pravachol 40 Mg Tabs (Pravastatin sodium) .... Take one tablet daily to lower cholesterol.  Other Orders: T-Comprehensive Metabolic Panel  ([id number]) Mammogram (Screening) (Mammo)  Patient Instructions: 1)  You are taking GREAT care of your health!  Continue to exercise and watch your diet! 2)  Please schedule a follow-up appointment in 6 months. 3)  You had labwork done today, we will call you if there is anything that needs to be addressed. Process Orders Check Orders Results:     [facility]: ABN not required for this insurance Tests Sent for requisitioning (June 12, 2009 1:06 PM):     06/12/2009: [facility] -- T-Comprehensive Metabolic Panel [[id number]] (signed)     06/12/2009: [facility] -- T-Lipid Profile [phone] (signed)     06/12/2009: [facility] -- T-CBC No Diff [[id number]] (signed)     06/12/2009: [facility] -- T-Ferritin [[id number]] (signed)     06/12/2009: [facility] -- Augusto Gamble [[id number]] (signed)    Prevention & Chronic Care Immunizations   Influenza vaccine: Fluvax Non-MCR  (04/09/2007)    Tetanus booster: Not documented    Pneumococcal vaccine: Not documented  Colorectal Screening   Hemoccult: Not documented    Colonoscopy: Abnormal  (05/10/2005)  Other Screening   Pap smear: transformation zone absent.  repeat in one year.  (09/01/2008)   Pap smear due: 09/18/2011    Mammogram: ASSESSMENT: Negative - BI-RADS 1^MS DIGITAL SCREENING  (05/13/2008)   Mammogram action/deferral: Ordered  (06/12/2009)  Reports requested:   Last colonoscopy report requested.  Smoking status: quit  (06/12/2009)  Lipids   Total Cholesterol: 188  (12/12/2008)   Lipid panel action/deferral: Lipid Panel ordered   LDL: 112  (12/12/2008)   LDL Direct: Not documented   HDL: 57  (12/12/2008)   Triglycerides: 97  (12/12/2008)    SGOT (AST): 28  (04/04/2008)   BMP action: Ordered   SGPT (ALT): 30  (04/04/2008) CMP ordered    Alkaline phosphatase: 79  (04/04/2008)   Total bilirubin: 0.6   (04/04/2008)    Lipid flowsheet reviewed?: Yes   Progress toward LDL goal: Unchanged  Hypertension   Last Blood Pressure: 140 / 96  (06/12/2009)   Serum creatinine: 0.89  (12/12/2008)   BMP action: Ordered   Serum potassium 3.8  (12/12/2008) CMP ordered     Hypertension flowsheet reviewed?: Yes   Progress toward BP goal: Deteriorated  Self-Management Support :    Hypertension self-management support: Not documented    Lipid self-management support: Not documented    Nursing Instructions: Schedule screening mammogram (see order) Request report of last colonoscopy    Laboratory Results   Blood Tests   Date/Time Received: June 12, 2009 12:22 PM Date/Time Reported: Alric Quan  June 12, 2009 12:22 PM  HGBA1C: 6.2%   (Normal Range: Non-Diabetic - 3-6%   Control Diabetic - 6-8%)

## 2010-07-05 NOTE — Letter (Signed)
Summary: Colonoscopy Date Change Letter  [facility]  [address]   [place], [place] [address]   Phone: [phone]  Fax: [phone]      June 05, 2010 MRN: [id number]   [name] [address] [place], [place]  [address]   Dear Ms. [name],   Previously you were recommended to have a repeat colonoscopy around this time. Your chart was recently reviewed by Dr.Elnita Surprenant of [facility]. Follow up colonoscopy is now recommended in December 2013. This revised recommendation is based on current, nationally recognized guidelines for colorectal cancer screening and polyp surveillance. These guidelines are endorsed by the American Cancer Society, [employer] on Colorectal Cancer as well as numerous other major medical organizations.  Please understand that our recommendation assumes that you do not have any new symptoms such as bleeding, a change in bowel habits, anemia, or significant abdominal discomfort. If you do have any concerning GI symptoms or want to discuss the guideline recommendations, please call to arrange an office visit at your earliest convenience. Otherwise we will keep you in our reminder system and contact you 1-2 months prior to the date listed above to schedule your next colonoscopy.  Thank you,   Stan Head, M.D.  [facility] [phone]

## 2010-07-18 ENCOUNTER — Ambulatory Visit (INDEPENDENT_AMBULATORY_CARE_PROVIDER_SITE_OTHER): Payer: Self-pay | Admitting: Ophthalmology

## 2010-07-18 ENCOUNTER — Other Ambulatory Visit: Payer: Self-pay | Admitting: Ophthalmology

## 2010-07-18 ENCOUNTER — Encounter: Payer: Self-pay | Admitting: Ophthalmology

## 2010-07-18 DIAGNOSIS — Z23 Encounter for immunization: Secondary | ICD-10-CM

## 2010-07-18 DIAGNOSIS — E785 Hyperlipidemia, unspecified: Secondary | ICD-10-CM

## 2010-07-18 DIAGNOSIS — M25569 Pain in unspecified knee: Secondary | ICD-10-CM

## 2010-07-18 DIAGNOSIS — I10 Essential (primary) hypertension: Secondary | ICD-10-CM

## 2010-07-18 DIAGNOSIS — E669 Obesity, unspecified: Secondary | ICD-10-CM

## 2010-07-18 DIAGNOSIS — Z0000 Encounter for general adult medical examination without abnormal findings: Secondary | ICD-10-CM | POA: Insufficient documentation

## 2010-07-18 MED ORDER — DICLOFENAC SODIUM 1 % TD GEL: 1.0000 cm | Freq: Four times a day (QID) | TRANSDERMAL | Status: DC

## 2010-07-18 NOTE — Assessment & Plan Note (Addendum)
The patient has continued to gain weight since her last visit here. Currently, the patient's BMI is 52. I did discuss the idea of bariatric surgery with the patient today  but she was not amenable to this.  The patient states that she would like to see how the voltaren gel works on her knees, she plans to try to work out more frequently, and try to lose weight herself.

## 2010-07-18 NOTE — Patient Instructions (Addendum)
Please continue to try to exercise and diet. I would like you to return in the next few weeks fasting for a blood check. You may use Voltaren gel for your knee pain to please try to avoid NSAIDs such as Advil.

## 2010-07-18 NOTE — Assessment & Plan Note (Signed)
The patients blood pressure was within reasonable control today (BP: 131/84 mmHg ) and I will not make any adjustments to the patients anti-hypertensive regimen. I will continue to monitor and titrate the patients medications as needed at future visits.

## 2010-07-18 NOTE — Assessment & Plan Note (Signed)
The patient continues to have mild right knee pain exacerbated by walking. The patient did not get any relief from Tylenol, since she has been taking liquid Advil 3 times daily. I recommended that the patient quit this given the risks of GI bleed and peptic ulcer disease. I will prescribe her with Voltaren today to try to relieve her symptoms. The patient continues to have no red flag symptoms, and denies any fevers chills or bone pain.

## 2010-07-18 NOTE — Progress Notes (Signed)
  Subjective:    Patient ID: [name], female    DOB: 04-25-1951, 60 y.o.   MRN: [id number]  HPI   this is a 60 year old female with a past medical history significant for hypertension, obesity, hyperlipidemia,  Who presents for routine followup. I last saw the patient in October of last year. At that time, the patient was complaining of worsening right knee pain as well as pain that radiates across her shoulder blades.   At that time, the patient was started on Tylenol for her knee pain comment was informed that she should use warm compresses for her neck pain. The patient's primary issue at that time was her obesity  And she had gained 9 pounds since her prior visit. Reduced or working out at [employer]. Since that time, Tylenol did not help her pain so she has been taking liquid advil tid since then.  With regards to her obesity, she has been watching her diet but has not be able to get [facility]  The patient is currently not getting any regular exercise. Other than her knee pain, the patient denies any further complaints.   Review of Systems  Constitutional: Negative for fever and chills.  Respiratory: Negative for cough and shortness of breath.   Cardiovascular: Negative for chest pain and palpitations.  Gastrointestinal: Negative for vomiting, diarrhea and constipation.       Objective:   Physical Exam  Constitutional: She appears well-developed and well-nourished.  HENT:  Head: Normocephalic and atraumatic.  Eyes: Pupils are equal, round, and reactive to light.  Cardiovascular: Normal rate, regular rhythm and intact distal pulses.  Exam reveals no gallop and no friction rub.   No murmur heard. Pulmonary/Chest: Effort normal and breath sounds normal. She has no wheezes. She has no rales.  Abdominal: Soft. Bowel sounds are normal. She exhibits no distension. There is no tenderness.  Musculoskeletal: Normal range of motion.  Neurological: She is alert. No cranial nerve deficit.  Skin:  No rash noted.        Current Outpatient Prescriptions on File Prior to Visit  Medication Sig Dispense Refill  . acetaminophen (TYLENOL) 650 MG CR tablet Take 650 mg by mouth 2 (two) times daily.        Marland Kitchen aspirin 81 MG EC tablet Take 81 mg by mouth daily.        . hydrochlorothiazide 25 MG tablet Take 25 mg by mouth daily.        . Multiple Vitamin (MULTIVITAMIN) tablet Take 1 tablet by mouth daily.        . pravastatin (PRAVACHOL) 40 MG tablet Take 40 mg by mouth daily. To lower cholesterol         Past Medical History  Diagnosis Date  . Right knee pain   . Lumbar back pain   . Hypertension   . Hyperlipidemia   . Obesity   . Premenopausal menorrhagia   . Iron deficiency anemia      Assessment & Plan:

## 2010-07-18 NOTE — Assessment & Plan Note (Signed)
The patient the patient is up to date on her flu shot and had one of her last visit, date on her colonoscopy. The patient will need a repeat Pap smear at the next visit  If time is available. I will  Check a repeat fasting lipid panel as well as a basic metabolic panel sometime in the next few weeks.

## 2010-07-18 NOTE — Assessment & Plan Note (Signed)
The patient's last lipid profile was done in January of last year,  And was normal except for an LDL of 119. I will recheck the patient's fasting lipid profile in the next few weeks to

## 2010-07-24 ENCOUNTER — Encounter: Payer: Self-pay | Admitting: Ophthalmology

## 2010-08-08 ENCOUNTER — Other Ambulatory Visit: Payer: Self-pay

## 2010-08-08 DIAGNOSIS — I10 Essential (primary) hypertension: Secondary | ICD-10-CM

## 2010-08-08 DIAGNOSIS — E785 Hyperlipidemia, unspecified: Secondary | ICD-10-CM

## 2010-08-08 LAB — BASIC METABOLIC PANEL
BUN: 10 mg/dL (ref 6–23)
CO2: 28 mEq/L (ref 19–32)
Calcium: 8.9 mg/dL (ref 8.4–10.5)
Chloride: 101 mEq/L (ref 96–112)
Creat: 0.94 mg/dL (ref 0.40–1.20)
Glucose, Bld: 102 mg/dL — ABNORMAL HIGH (ref 70–99)
Potassium: 4 mEq/L (ref 3.5–5.3)
Sodium: 140 mEq/L (ref 135–145)

## 2010-08-08 LAB — LIPID PANEL
Cholesterol: 174 mg/dL (ref 0–200)
Total CHOL/HDL Ratio: 3.1 Ratio
Triglycerides: 86 mg/dL (ref <150)
VLDL: 17 mg/dL (ref 0–40)

## 2010-08-10 ENCOUNTER — Other Ambulatory Visit: Payer: Self-pay | Admitting: Ophthalmology

## 2010-08-10 DIAGNOSIS — Z1231 Encounter for screening mammogram for malignant neoplasm of breast: Secondary | ICD-10-CM

## 2010-08-16 ENCOUNTER — Ambulatory Visit (HOSPITAL_COMMUNITY)
Admission: RE | Admit: 2010-08-16 | Discharge: 2010-08-16 | Disposition: A | Discharge status: Home / Self Care | Payer: Self-pay | Source: Ambulatory Visit | Attending: Internal Medicine | Admitting: Internal Medicine

## 2010-08-16 DIAGNOSIS — Z1231 Encounter for screening mammogram for malignant neoplasm of breast: Secondary | ICD-10-CM

## 2010-10-16 NOTE — Op Note (Signed)
NAMEANSHU, WEHNER             ACCOUNT NO.:  [id number]   MEDICAL RECORD NO.:  [id number]          PATIENT TYPE:  AMB   LOCATION:  [facility]                           FACILITY:  [facility]   PHYSICIAN:  Phil D. Okey Dupre, M.D.     DATE OF BIRTH:  1950-09-21   DATE OF PROCEDURE:  07/14/2007  DATE OF DISCHARGE:                               OPERATIVE REPORT   PROCEDURE:  Examination under anesthesia, dilatation and curettage and  diagnostic hysteroscopy.   PREOPERATIVE DIAGNOSIS:  Postmenopausal bleeding.   POSTOPERATIVE DIAGNOSIS:  Postmenopausal bleeding plus multiple uterine  polyps pending pathology report.   SURGEON:  Javier Glazier. Okey Dupre, M.D.   ESTIMATED BLOOD LOSS:  Minimal.   POSTOPERATIVE CONDITION:  Satisfactory.   SPECIMENS TO PATHOLOGY:  Endometrial curettings.   ANESTHESIA:  General and local.   OPERATIVE FINDINGS:  On entering with the hysteroscope 3 polyps were  immediately noticed, 2 coming from the left lateral wall of the  endometrium situated approximately halfway between the external os and  the fundus.  There was also one about one-third the way up the uterine  cavity on the right side.  The rest of the endometrium looked fairly  atrophic with some small calcific white plaques that were noted.  Other  than that there was nothing suspicious that gave me an indication of  anything serious.  The procedure went as follows.   PROCEDURE IN DETAIL:  Under satisfactory general anesthesia with the  patient in the dorsal lithotomy position the perineum and vagina were  prepped and draped in the usual sterile manner.  Bimanual pelvic  examination revealed a uterus that was in first degree prolapse with a  short vagina and a cervix in the midportion with the patient in an  extended position.  The cervix was parous.  The vagina was clean.  The  introitus was marital.  The uterine could not be well outlined nor the  ovaries because of the habitus of the patient.  Weighted speculum was  placed in posterior fourchette of the vagina.  Anterior lip of cervix  grasped with single tooth tenaculum.  Ten mL of 1% Xylocaine was  injected in each of the lateral paracervical areas at 4 and 8 o'clock  for further patient comfort.  The uterine cavity was then sounded to a  depth of 11 cm, cervical os dilated to #10 Hagar dilator.  The 30 degree  hysteroscope was inserted using normal saline as a dilating medium.  The  findings were as above.  This was removed.  The endometrial cavity was  curetted vigorously with a small serrated sharp followed by a medium  sharp curette and explored with polyp forceps.  This was followed by  reentering and examining the patient with a  hysteroscope, removing it and recuretting the patient.  Specimens were  sent for pathologic diagnosis.  Tenaculum and speculum removed from  vagina.  The patient transferred to the recovery room in satisfactory  condition having tolerated the procedure well.      Phil D. Okey Dupre, M.D.  Electronically Signed     PDR/MEDQ  D:  07/14/2007  T:  07/15/2007  Job:  [id number]

## 2010-10-16 NOTE — Group Therapy Note (Signed)
[name] NO.:  [id number]   MEDICAL RECORD NO.:  [id number]          PATIENT TYPE:  WOC   LOCATION:  [facility]                   FACILITY:  [facility]   PHYSICIAN:  Argentina Donovan, MD        DATE OF BIRTH:  May 26, 1951   DATE OF SERVICE:  06/10/2007                                  CLINIC NOTE   CHIEF COMPLAINT:  Postmenopausal bleeding.   HISTORY OF PRESENT ILLNESS:  This is a 60 year old female referred from  the [facility] outpatient clinic for postmenopausal bleeding for a  possible endometrial biopsy.  She says that she thought she went through  menopause about eight years ago, upon which she ceased to have normal  periods although would have occasional cycles here and there; however,  she went 2-3 years most recently without having any spotting or bleeding  at all; however, this October, she began to have intermittent spotting  and since then has continued to have the spotting.   In October, the spotting was heavier, and she used numerous pads at that  time; however, since then, it has been most just spotting and using  panty liners only.   She does not have a family history of cancer.  She denies any other  complaints at this time.   FAMILY HISTORY:  Noncontributory.   PAST MEDICAL HISTORY:  Hypertension.   MEDICATIONS:  HCTZ, multivitamin, aspirin are the only ones she states.   ALLERGIES:  No known drug allergies.   PHYSICAL EXAMINATION:  Weight 315 pounds.  Height 65 inches.  Blood  pressure 130/73, temperature 98.6, heart rate 77.  GU:  It is very difficult to do this exam, as the patient is morbidly  obese.  Her cervix is located very posteriorly and only the hip and  anterior portion of the cervix can be palpated.  When the speculum was  inserted, the cervix was not visualized, as the vaginal area is very  tight in nature and unable to pass the speculum completely.  Endometrial  biopsy was not obtained for these reasons.   ASSESSMENT/PLAN:  This is a 60 year old female with likely  postmenopausal bleeding.  Postmenopausal bleeding:  Very concerning for possible endometrial  cancer or at least endometrial hyperplasia, which in turn could lead to  cancer in the future.  Her risk factors include obesity as her main one.  We were unable to perform an endometrial biopsy due to body habitus.  Therefore, we will have her return to the hospital for an  ultrasound, transvaginal, and then later a D&C and diagnostic  hysteroscopy.  She will be notified of these dates.  Otherwise, no  action will be taken at this time.     ______________________________  Argentina Donovan, MD    ______________________________  Argentina Donovan, MD    PR/MEDQ  D:  06/10/2007  T:  06/10/2007  Job:  [id number]

## 2010-10-19 NOTE — Group Therapy Note (Signed)
[name], [name] NO.:  [id number]   MEDICAL RECORD NO.:  [id number]          PATIENT TYPE:  WOC   LOCATION:  [facility]                   FACILITY:  [facility]   PHYSICIAN:  Carolanne Grumbling, M.D.   DATE OF BIRTH:  08/15/1950   DATE OF SERVICE:                                    CLINIC NOTE   HISTORY OF PRESENT ILLNESS:  A 60 year old G3, P2-0-1-2 who presents for  evaluation of postmenopausal bleeding.  Patient thinks she has went through  menopause over the last 2 years.  Her periods were getting irregular even a  few years prior to that.  She had no vaginal bleeding for those 2 years  until October when she described a cycle and then she has had intermittent  spotting since then.  She endorses night sweats, hot flashes.  She was seen  by a gynecologist previously for the vaginal bleeding, and was told to go on  hormone replacement therapy, but because she had trouble with birth control  pills in the past which she describes as causing her to gain weight, and she  does not ever do that.   OBSTETRICAL/GYNECOLOGICAL HISTORY:  Menarche at age 29; had regular cycles  until the last approximately 4 years, and they stopped 2 years ago.  She has  been pregnant 3 times with 2 full-term babies.  Last Pap smear was in  November, and within normal limits.  She did have a history of some abnormal  ones approximately 10 years ago, but followup was always fine.   PAST MEDICAL HISTORY:  Hypertension, obesity, hyperlipidemia, asthma.   MEDICATIONS:  Hydrochlorothiazide 25 mg daily.   ALLERGIES:  No known drug allergies.   FAMILY HISTORY:  Father died at age 29 of myocardial infarction, and also  had hypertension.  Mother has hypertension.   PAST SURGICAL HISTORY:  Negative.   SOCIAL HISTORY:  Patient is divorced.  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   PHYSICAL EXAMINATION:  VITAL SIGNS:  Temperature 97.8, blood pressure  118/68, pulse  84, weight 298.5, height 5 feet 5 inches.  GENERAL:  Well-developed, well-nourished female in no apparent distress.  GU:  Normal external genitalia.  Vagina has pink rugae.  Bimanual exam is  limited by her obese body habitus.  However, her uterus did feel anteverted.   PROCEDURE:  Patient was consented for endometrial biopsy.  Speculum was  placed.  Cervix was Betadined.  Anterior lip of the cervix was grasped with  the tenaculum, and uterus sounded to 9.5 cm.  Endometrial biopsy was  introduced 2 times, and an adequate sample was obtained.   IMPRESSION:  Postmenopausal bleeding.   PLAN:  Endometrial biopsy done today, and we explained that we need to rule  out endometrial cancer.  We will have her follow up in 2 weeks.  The patient  was discussed with Dr. Okey Dupre.           ______________________________  Carolanne Grumbling, M.D.     TW/MEDQ  D:  05/07/2005  T:  05/07/2005  Job:  [id number]

## 2010-10-24 ENCOUNTER — Encounter: Payer: Self-pay | Admitting: Ophthalmology

## 2010-10-31 ENCOUNTER — Encounter: Payer: Self-pay | Admitting: Ophthalmology

## 2010-10-31 ENCOUNTER — Ambulatory Visit (INDEPENDENT_AMBULATORY_CARE_PROVIDER_SITE_OTHER): Payer: Self-pay | Admitting: Ophthalmology

## 2010-10-31 DIAGNOSIS — E669 Obesity, unspecified: Secondary | ICD-10-CM

## 2010-10-31 DIAGNOSIS — N898 Other specified noninflammatory disorders of vagina: Secondary | ICD-10-CM

## 2010-10-31 DIAGNOSIS — N920 Excessive and frequent menstruation with regular cycle: Secondary | ICD-10-CM

## 2010-10-31 DIAGNOSIS — Z0000 Encounter for general adult medical examination without abnormal findings: Secondary | ICD-10-CM

## 2010-10-31 DIAGNOSIS — M25569 Pain in unspecified knee: Secondary | ICD-10-CM

## 2010-10-31 DIAGNOSIS — I10 Essential (primary) hypertension: Secondary | ICD-10-CM

## 2010-10-31 NOTE — Assessment & Plan Note (Signed)
The patients blood pressure was within reasonable control today (BP: 128/84 mmHg ) and I will not make any adjustments to the patients anti-hypertensive regimen. I will continue to monitor and titrate the patients medications as needed at future visits.

## 2010-10-31 NOTE — Progress Notes (Signed)
Subjective:    Patient ID: [name], female    DOB: 1951/03/17, 60 y.o.   MRN: [id number]  HPI  This is a 60 year old female with a past medical history significant for obesity, right knee osteoarthritis, and hypertension who presents for routine followup. At the patient's last visit, her primary concern continued to be her obesity. The patient had gained weight at her last visit but stated that she was not interested in bariatric surgery 1 to continue to try to lose weight on her own. The patient's weight has been stable and she's down 1 pound from her last visit. The patient has been attempting to increase her exercise, but was initially inhibited by her knee pain. The patient tried using Valtaren however this did not help. The patient then started using a mixture of Vaseline and red pepper which seemed to relieve her pain. The patient states that her pain is now primarily gone but she continues to have occasional pressure in the knee. The patient has no other concerns today, but did wish to come in to have a Pap smear performed. The patient denies any chest pain shortness of breath changes in her bowel movements or other concerning symptoms. At the end of our visit, the patient also endorsed a mild degree of spotting that started on March 15 of this year, it returned 2 days later and then occurred a few times last month. This was not associated with intercourse nor any changes in her vaginal discharge.  Review of Systems  Constitutional: Negative for fever and chills.  Respiratory: Negative for cough and shortness of breath.   Cardiovascular: Negative for chest pain and palpitations.  Gastrointestinal: Negative for vomiting, diarrhea and constipation.       Objective:   Physical Exam  Constitutional: She appears well-developed and well-nourished.  HENT:  Head: Normocephalic and atraumatic.  Eyes: Pupils are equal, round, and reactive to light.  Cardiovascular: Normal rate, regular  rhythm and intact distal pulses.  Exam reveals no gallop and no friction rub.   No murmur heard. Pulmonary/Chest: Effort normal and breath sounds normal. She has no wheezes. She has no rales.  Abdominal: Soft. Bowel sounds are normal. She exhibits no distension. There is no tenderness.  Genitourinary: Vagina normal.  Musculoskeletal: Normal range of motion.  Neurological: She is alert. No cranial nerve deficit.  Skin: No rash noted.        Current Outpatient Prescriptions on File Prior to Visit  Medication Sig Dispense Refill  . acetaminophen (TYLENOL) 650 MG CR tablet Take 650 mg by mouth 2 (two) times daily.        Marland Kitchen aspirin 81 MG EC tablet Take 81 mg by mouth daily.        . diclofenac sodium (VOLTAREN) 1 % GEL Apply 1 cm topically 4 (four) times daily.  1 Tube  6  . hydrochlorothiazide 25 MG tablet Take 25 mg by mouth daily.        . Multiple Vitamin (MULTIVITAMIN) tablet Take 1 tablet by mouth daily.        . pravastatin (PRAVACHOL) 40 MG tablet Take 40 mg by mouth daily. To lower cholesterol        Past Medical History  Diagnosis Date  . Right knee pain   . Lumbar back pain   . Hypertension   . Hyperlipidemia   . Obesity   . Premenopausal menorrhagia   . Iron deficiency anemia     postmenopausal bleeding followed by Dr. Okey Dupre.  Hx  of benign endometrial polyp.   . Allergic rhinitis   . Endometrial polyp     bemogm 12/06  . Elevated LFTs     Resolved- etiology unclear hep B, Hep C serology neg 2005  . Colonic polyp     Hyperplastic, no adenomatous or malignant changes.   . Acanthosis nigricans   . Hx of abnormal cervical Pap smear     1980 normal in 2006.      Assessment & Plan:

## 2010-10-31 NOTE — Progress Notes (Signed)
Addended by: Sinda Du on: 10/31/2010 03:02 PM   Modules accepted: Orders

## 2010-10-31 NOTE — Assessment & Plan Note (Addendum)
I have encouraged the patient to continue to attempt weight loss with diet and exercise. I also informed the patient that she could set up an appointment with Dr. Victory Dakin for dietary advice if she wishes. At this point at least, the patient's weight is stable. I do think that her best alternative bariatric surgery, but she's not interested.

## 2010-10-31 NOTE — Patient Instructions (Addendum)
Please return in 2-3 months to meet your new physician. Call sooner if needed. You will need a followup with the gynecologist soon about your bleeding.

## 2010-10-31 NOTE — Assessment & Plan Note (Signed)
This is not associated with any other symptoms such as cramping, and given the patient's significant obesity I suspect endometrial hyperplasia. The patient will likely need endometrial biopsy for which I will refer her to OB/GYN. Pap smear was performed today.

## 2010-10-31 NOTE — Assessment & Plan Note (Signed)
This is greatly improved, I informed patient that she could continue to use red pepper and Vaseline if needed as this is simply a way to administer capsaicin however I did inform her that we could write her a prescription if she wishes. Given her knee pain I recommended low-impact exercises such as swimming.

## 2010-10-31 NOTE — Assessment & Plan Note (Signed)
The patient presents today with severe she can have her Pap smear. Exam was normal, will await results.

## 2011-01-11 ENCOUNTER — Other Ambulatory Visit: Payer: Self-pay | Admitting: Obstetrics & Gynecology

## 2011-01-11 ENCOUNTER — Ambulatory Visit (INDEPENDENT_AMBULATORY_CARE_PROVIDER_SITE_OTHER): Payer: Self-pay | Admitting: Obstetrics & Gynecology

## 2011-01-11 ENCOUNTER — Encounter: Payer: Self-pay | Admitting: Obstetrics and Gynecology

## 2011-01-11 ENCOUNTER — Other Ambulatory Visit (HOSPITAL_COMMUNITY)
Admission: RE | Admit: 2011-01-11 | Discharge: 2011-01-11 | Disposition: A | Discharge status: Home / Self Care | Payer: Self-pay | Source: Ambulatory Visit | Attending: Obstetrics & Gynecology | Admitting: Obstetrics & Gynecology

## 2011-01-11 VITALS — BP 121/69 | HR 75 | Temp 98.3°F | Ht 65.0 in | Wt 321.2 lb

## 2011-01-11 DIAGNOSIS — N950 Postmenopausal bleeding: Secondary | ICD-10-CM | POA: Insufficient documentation

## 2011-01-11 DIAGNOSIS — N840 Polyp of corpus uteri: Secondary | ICD-10-CM | POA: Insufficient documentation

## 2011-01-11 MED ORDER — IBUPROFEN 200 MG PO TABS
800.0000 mg | ORAL_TABLET | Freq: Once | ORAL | Status: AC
  Administered 2011-01-11: 800 mg via ORAL

## 2011-01-11 NOTE — Progress Notes (Signed)
  Patient here today reporting postmenopausal spotting.  Reported menopause at age 60, no bleeding until March 2012. Had two more episodes of spotting in April and May, none since.  No other GYN symptoms.  Of note, patient had menorrhagia in 2006, had removal of benign endometrial polyp by Dr. Okey Dupre.  I reviewed problem list, PMH, PSH, medications and allergies.  Patient had normal pap on 10/31/10. Counseled regarding need for endometrial biopsy.  Endometrial Biopsy Procedure Note  Pre-operative Diagnosis: Postmenopausal spotting  Post-operative Diagnosis: same  Indications: postmenopausal bleeding  Procedure Details   Urine pregnancy test was not done.  The risks (including infection, bleeding, pain, and uterine perforation) and benefits of the procedure were explained to the patient and Written informed consent was obtained.  The patient was placed in the dorsal lithotomy position.  Bimanual exam showed the uterus to be in the neutral position.  A Graves' speculum inserted in the vagina, and the cervix prepped with povidone iodine.   A sharp tenaculum was applied to the anterior lip of the cervix for stabilization.  A sterile uterine sound was used to sound the uterus to a depth of 10 cm.  A Pipelle endometrial aspirator was used to sample the endometrium.  Sample was sent for pathologic examination.  Condition: Stable  Complications: None  Plan: The patient was advised to call for any fever or for prolonged or severe pain or bleeding. She was advised to use NSAID as needed for mild to moderate pain. She was advised to avoid vaginal intercourse for 48 hours or until the bleeding has completely stopped.  A/P:  Will follow up biopsy results Pelvic ultrasound ordered. Given Ibuprofen 800mg  po x 1 dose in clinic.

## 2011-01-11 NOTE — Progress Notes (Signed)
Addended by: Darrel Hoover on: 01/11/2011 12:09 PM   Modules accepted: Orders

## 2011-01-15 ENCOUNTER — Ambulatory Visit (HOSPITAL_COMMUNITY)
Admission: RE | Admit: 2011-01-15 | Discharge: 2011-01-15 | Disposition: A | Discharge status: Home / Self Care | Payer: Self-pay | Source: Ambulatory Visit | Attending: Obstetrics & Gynecology | Admitting: Obstetrics & Gynecology

## 2011-01-15 DIAGNOSIS — N950 Postmenopausal bleeding: Secondary | ICD-10-CM | POA: Insufficient documentation

## 2011-02-08 ENCOUNTER — Ambulatory Visit (INDEPENDENT_AMBULATORY_CARE_PROVIDER_SITE_OTHER): Payer: Self-pay | Admitting: Obstetrics & Gynecology

## 2011-02-08 VITALS — BP 137/80 | HR 76 | Temp 97.2°F | Wt 321.4 lb

## 2011-02-08 DIAGNOSIS — N950 Postmenopausal bleeding: Secondary | ICD-10-CM

## 2011-02-08 NOTE — Progress Notes (Signed)
  Subjective:    Patient ID: [name], female    DOB: [birth year], 60 y.o.   MRN: [id number]  HPIPt comes for result of biopsy    Review of Systems     Objective:   Physical Exam        Assessment & Plan:

## 2011-02-08 NOTE — Progress Notes (Incomplete Revision)
Subjective:Results    Patient ID: [name], female    DOB: 23-Apr-1951, 60 y.o.   MRN: [id number]  HPIPt comes for result of biopsy of endometrium 01/11/11. No postmenopausal bleeding now, no pain. Past Medical History  Diagnosis Date  . Right knee pain   . Lumbar back pain   . Hypertension   . Hyperlipidemia   . Obesity   . Premenopausal menorrhagia   . Iron deficiency anemia     postmenopausal bleeding followed by Dr. Okey Dupre.  Hx of benign endometrial polyp.   . Endometrial polyp     bemogm 12/06  . Elevated LFTs     Resolved- etiology unclear hep B, Hep C serology neg 2005  . Colonic polyp     Hyperplastic, no adenomatous or malignant changes.   . Acanthosis nigricans   . Hx of abnormal cervical Pap smear     1980 normal in 2006.   Marland Kitchen Allergic rhinitis    Current Outpatient Prescriptions on File Prior to Visit  Medication Sig Dispense Refill  . acetaminophen (TYLENOL) 650 MG CR tablet Take 650 mg by mouth 2 (two) times daily.        Marland Kitchen aspirin 81 MG EC tablet Take 81 mg by mouth daily.        . hydrochlorothiazide 25 MG tablet Take 25 mg by mouth daily.        . Multiple Vitamin (MULTIVITAMIN) tablet Take 1 tablet by mouth daily.        . pravastatin (PRAVACHOL) 40 MG tablet Take 40 mg by mouth daily. To lower cholesterol       . diclofenac sodium (VOLTAREN) 1 % GEL Apply 1 cm topically 4 (four) times daily.  1 Tube  6   No Known Allergies Past Surgical History  Procedure Date  . Therapeutic abortion 1975   Hyisteroscopy, D and C benign polyp  *RADIOLOGY REPORT*  Clinical Data: Postmenopausal vaginal bleeding.  TRANSABDOMINAL AND TRANSVAGINAL ULTRASOUND OF PELVIS  Technique: Both transabdominal and transvaginal ultrasound  examinations of the pelvis were performed. Transabdominal technique  was performed for global imaging of the pelvis including uterus,  ovaries, adnexal regions, and pelvic cul-de-sac.  Comparison: 06/16/2007  It was necessary to proceed with  endovaginal exam following the  transabdominal exam to visualize the uterus and ovaries. Poor  visualization secondary to body habitus.  Findings:  Uterus: 10.2 x 5.9 x 5.4 cm. Anteverted, anteflexed. No focal  abnormality.  Endometrium: 8 mm. Uniformly echogenic.  Right ovary: Not visualized. No adnexal mass.  Left ovary: Not visualized. No adnexal mass.  Other findings: No free fluid  IMPRESSION:  No focal abnormality. Endometrial thickness is mildly prominent  given the patient's post menopausal state. Consider  sonohysterography for evaluation for possible underlying  endometrial abnormality.  Original Report Authenticated By: Harrel Lemon, M.D.      Imaging     US Transvaginal Non-OB (Order #[id number]) on 01/15/2011 - Imaging Information         Imaging     Imaging Information            Signed by       Signed  Date/Time    Phone  Pager    Harrel Lemon  01/15/2011  4:37 PM EDT  [phone]  [phone]        [place] 8mm endometrium, no lesions  Review of Systems     Objective:   Physical Exam  Biopsy-fragments of polyp, no hyperplasia or atypia  Assessment & Plan:  Benign polyps on bx, no bleeding now Report further episodes, f/u with PCP

## 2011-02-22 LAB — CBC
MCHC: 34
MCV: 87
Platelets: 316
RDW: 15.2
WBC: 6.9

## 2011-02-22 LAB — BASIC METABOLIC PANEL
BUN: 8
Chloride: 100
Creatinine, Ser: 0.86
Glucose, Bld: 113 — ABNORMAL HIGH

## 2011-03-24 ENCOUNTER — Other Ambulatory Visit: Payer: Self-pay | Admitting: Ophthalmology

## 2011-04-11 ENCOUNTER — Other Ambulatory Visit: Payer: Self-pay | Admitting: Ophthalmology

## 2011-04-18 ENCOUNTER — Ambulatory Visit (INDEPENDENT_AMBULATORY_CARE_PROVIDER_SITE_OTHER): Payer: Self-pay | Admitting: Ophthalmology

## 2011-04-18 ENCOUNTER — Encounter: Payer: Self-pay | Admitting: Ophthalmology

## 2011-04-18 DIAGNOSIS — M25569 Pain in unspecified knee: Secondary | ICD-10-CM

## 2011-04-18 DIAGNOSIS — N898 Other specified noninflammatory disorders of vagina: Secondary | ICD-10-CM

## 2011-04-18 DIAGNOSIS — Z23 Encounter for immunization: Secondary | ICD-10-CM

## 2011-04-18 DIAGNOSIS — E785 Hyperlipidemia, unspecified: Secondary | ICD-10-CM

## 2011-04-18 DIAGNOSIS — N920 Excessive and frequent menstruation with regular cycle: Secondary | ICD-10-CM

## 2011-04-18 DIAGNOSIS — E669 Obesity, unspecified: Secondary | ICD-10-CM

## 2011-04-18 DIAGNOSIS — I10 Essential (primary) hypertension: Secondary | ICD-10-CM

## 2011-04-18 DIAGNOSIS — Z0000 Encounter for general adult medical examination without abnormal findings: Secondary | ICD-10-CM

## 2011-04-18 MED ORDER — HYDROCHLOROTHIAZIDE 25 MG PO TABS: 25.0000 mg | ORAL_TABLET | Freq: Every day | ORAL | Status: DC

## 2011-04-18 NOTE — Assessment & Plan Note (Addendum)
Patient is due for 5-year follow up colonoscopy. Will re-refer her today. Flu shot given.

## 2011-04-18 NOTE — Patient Instructions (Signed)
Please fill your blood pressure medicine today and go see our nutritionist, Norm Parcel.

## 2011-04-18 NOTE — Assessment & Plan Note (Signed)
Mildly elevated today but patient has ran out of HCTZ yesterday. Will likely be well controlled on current dose.

## 2011-04-18 NOTE — Assessment & Plan Note (Signed)
Well controlled as of 3/12. Continue current treatment.

## 2011-04-18 NOTE — Assessment & Plan Note (Signed)
Patient is still having ocassional spotting however her endometrial biopsy was normal. Ob/gyn office wanted her to follow up in 6 months- around March of 2013.

## 2011-04-18 NOTE — Progress Notes (Signed)
Subjective:   Patient ID: [name] female   DOB: 06/02/1951 60 y.o.   MRN: [id number]  HPI: [name] is a 60 y.o. woman who is here for routine follow up of HTN, obesity and hyperlipidemia.  Spotting: still having spotting sporadically, last 2 days ago. Just a few drops, occurs about 2 times a month. No pain.   Obesity: not interested in bariatric surgery. Worried that she ay gain the weight back after going through such a major surgery. Trying to lose weight. Don't do a lot of walking, don't have much 'get up and go'. Have [employer] Stopped walking because of the knee pain for the past year. Used to walk 3X a week when she was at the [facility]. Also walks dog occasionally. Trying to avoid fried food, eats vegetables and fish, baked chicken. Yogurt, jell-o. Has never seen our nutritionist. Would be interested. Eats apples, bananas, almonds for a snack. Doesn't eat a lot of baked goods.  Knee pain: flares up once in a while, not predictable when flares occur. Hot showers help. Uses red pepper and vaseline which helps when she has a flare.  HTN: blood pressure is higher today, didn't take BP medicine today or yesterday. Was frustrated that she couldn't find a parking space.   Colonoscopy- due for 5 year follow up colonoscopy, Needs flu vaccine  Past Medical History  Diagnosis Date  . Right knee pain   . Lumbar back pain   . Hypertension   . Hyperlipidemia   . Obesity   . Premenopausal menorrhagia   . Iron deficiency anemia     postmenopausal bleeding followed by Dr. Okey Dupre.  Hx of benign endometrial polyp.   . Endometrial polyp     bemogm 12/06  . Elevated LFTs     Resolved- etiology unclear hep B, Hep C serology neg 2005  . Colonic polyp     Hyperplastic, no adenomatous or malignant changes.   . Acanthosis nigricans   . Hx of abnormal cervical Pap smear     1980 normal in 2006.   Marland Kitchen Allergic rhinitis    Current Outpatient Prescriptions  Medication Sig Dispense  Refill  . acetaminophen (TYLENOL) 650 MG CR tablet Take 650 mg by mouth 2 (two) times daily.        Marland Kitchen aspirin 81 MG EC tablet Take 81 mg by mouth daily.        . diclofenac sodium (VOLTAREN) 1 % GEL Apply 1 cm topically 4 (four) times daily.  1 Tube  6  . hydrochlorothiazide (HYDRODIURIL) 25 MG tablet TAKE 1 TABLET BY MOUTH ONCE A DAY  32 tablet  2  . Multiple Vitamin (MULTIVITAMIN) tablet Take 1 tablet by mouth daily.        . pravastatin (PRAVACHOL) 40 MG tablet TAKE ONE TABLET DAILY TO LOWER CHOLESTEROL.  32 tablet  8   Family History  Problem Relation Age of Onset  . Hypertension Mother   . Alcohol abuse Father   . Hypertension Father   . Heart disease Father   . Hypertension Sister   . Hypertension Sister    History   Social History  . Marital Status: Single    Spouse Name: N/A    Number of Children: N/A  . Years of Education: N/A   Social History Main Topics  . Smoking status: Former Smoker -- 10 years    Types: Cigarettes    Quit date: 07/18/1981  . Smokeless tobacco: None  . Alcohol Use: No  .  Drug Use: No  . Sexually Active: No   Other Topics Concern  . 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    Objective:  Physical Exam: Filed Vitals:   04/18/11 1555  BP: 148/95  Pulse: 76  Temp: 98.3 F (36.8 C)  TempSrc: Oral  Height: 5' 5.5" (1.664 m)  Weight: 317 lb 9.6 oz (144.062 kg)  SpO2: 96%   Constitutional: Vital signs reviewed.  Patient is a well-developed and well-nourished woman in no acute distress and cooperative with exam. Eyes: PERRL, EOMI, conjunctivae normal, No scleral icterus.  Cardiovascular: RRR, S1 normal, S2 normal, no MRG, pulses symmetric and intact bilaterally Pulmonary/Chest: CTAB, no wheezes, rales, or rhonchi Abdominal: Soft. Non-tender, non-distended, bowel sounds are normal Extremities: trace pedal edema on the left side Neurological: A&Ox 3, cranial nerve  II-XII are grossly intact.  Skin: Warm, dry and intact. No rash, cyanosis, or clubbing.  Psychiatric: Normal mood and affect. speech and behavior is normal. Judgment and thought content normal. Cognition and memory are normal.   Assessment & Plan:

## 2011-04-18 NOTE — Assessment & Plan Note (Signed)
Patient is doing fine with her current treatment of hot pepper and vaseline as needed. Advised her that losing weight would help her knees feel better.

## 2011-08-15 ENCOUNTER — Other Ambulatory Visit: Payer: Self-pay | Admitting: Internal Medicine

## 2011-08-15 DIAGNOSIS — Z1231 Encounter for screening mammogram for malignant neoplasm of breast: Secondary | ICD-10-CM

## 2011-09-05 ENCOUNTER — Other Ambulatory Visit: Payer: Self-pay | Admitting: *Deleted

## 2011-09-05 MED ORDER — HYDROCHLOROTHIAZIDE 25 MG PO TABS: 25.0000 mg | ORAL_TABLET | Freq: Every day | ORAL | Status: DC

## 2011-09-05 NOTE — Telephone Encounter (Signed)
[facility] is requesting  Authorization to dispense 90 days supply.

## 2011-09-17 ENCOUNTER — Ambulatory Visit (HOSPITAL_COMMUNITY)
Admission: RE | Admit: 2011-09-17 | Discharge: 2011-09-17 | Disposition: A | Discharge status: Home / Self Care | Payer: Self-pay | Source: Ambulatory Visit | Attending: Internal Medicine | Admitting: Internal Medicine

## 2011-09-17 DIAGNOSIS — Z1231 Encounter for screening mammogram for malignant neoplasm of breast: Secondary | ICD-10-CM

## 2011-09-24 ENCOUNTER — Telehealth: Payer: Self-pay | Admitting: *Deleted

## 2011-09-24 NOTE — Telephone Encounter (Signed)
Talked with pt - problems affording Rx - no insurance. Discuss seeing Rudell Cobb for [employer] and  MAP program. Info mailed to pt. Stanton Kidney Constancia Geeting RN 09/24/11 10:45AM

## 2011-10-30 ENCOUNTER — Ambulatory Visit (INDEPENDENT_AMBULATORY_CARE_PROVIDER_SITE_OTHER): Payer: Self-pay | Admitting: Internal Medicine

## 2011-10-30 ENCOUNTER — Encounter: Payer: Self-pay | Admitting: Internal Medicine

## 2011-10-30 VITALS — BP 128/86 | HR 78 | Temp 97.8°F | Resp 20 | Ht 65.5 in | Wt 315.2 lb

## 2011-10-30 DIAGNOSIS — E785 Hyperlipidemia, unspecified: Secondary | ICD-10-CM

## 2011-10-30 DIAGNOSIS — I10 Essential (primary) hypertension: Secondary | ICD-10-CM

## 2011-10-30 DIAGNOSIS — Z0000 Encounter for general adult medical examination without abnormal findings: Secondary | ICD-10-CM

## 2011-10-30 DIAGNOSIS — N949 Unspecified condition associated with female genital organs and menstrual cycle: Secondary | ICD-10-CM

## 2011-10-30 DIAGNOSIS — N926 Irregular menstruation, unspecified: Secondary | ICD-10-CM

## 2011-10-30 NOTE — Assessment & Plan Note (Signed)
Patient's LDL has been at goal when checked yearly for the last 3 years.  Will defer re-checking for now, as patient is well-controlled and compliant with her medications.  Consider rechecking in 1-2 years. -continue pravastatin

## 2011-10-30 NOTE — Patient Instructions (Addendum)
We are referring you to Rudell Cobb, to obtain an [employer], which is a discount card on clinic appointments and medications.  See Rudell Cobb in our clinic on a walk-in basis to complete an application.  We will follow-up on your last colonoscopy results, and may call you with a referral for a colonoscopy, once we have reviewed the results and once you have had a chance to obtain the orange card.  Please return for a follow-up visit in 6 months.

## 2011-10-30 NOTE — Assessment & Plan Note (Signed)
-  Will obtain records from prior colonoscopy, and call with referral if repeat colonoscopy needed -patient given information on [facility], as she has no insurance.  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

## 2011-10-30 NOTE — Assessment & Plan Note (Signed)
The patient has a history of postmenopausal/?perimenopausal bleeding, followed by [facility], with unremarkable [place] and endometrial biopsy results.  Symptoms are currently at baseline.  Patient informed of "warning signs", such as increased bleeding, unexplained weight loss, or night sweats.  Patient expressed understanding.  Will continue to monitor, patient will continue to follow at [facility]. -check CBC at next visit

## 2011-10-30 NOTE — Progress Notes (Signed)
HPI The patient is a 61 yo woman, history of HTN, HL, and post-menopausal bleeding (followed by [facility]), presenting for a routine follow-up visit, with no acute complaints.  The patient has a history of post-menopausal bleeding, followed by [facility], including prior ultrasound and endometrial biopsy, both of which were benign.  Her spotting is at its baseline, with 1-2 days of very light spotting, every 1-2 months.  She is not currently symptomatic.  The patient reports that she is due for a colonoscopy, saying that her last colonoscopy was 7-8 years ago at [facility], and that she was supposed to have a repeat colonoscopy in 5 years.  The patient notes no BRBPR or melena, and regular bowel movements.  She currently has no insurance, but I've given her information on applying for the orange card at this visit.  ROS: General: no fevers, chills, changes in weight, changes in appetite Skin: no rash HEENT: no blurry vision, hearing changes, sore throat Pulm: no dyspnea, coughing, wheezing CV: no chest pain, palpitations, shortness of breath Abd: no abdominal pain, nausea/vomiting, diarrhea/constipation GU: no dysuria, hematuria, polyuria Ext: no arthralgias, myalgias Neuro: no weakness, numbness, or tingling   Filed Vitals:   10/30/11 1341  BP: 128/86  Pulse: 78  Temp: 97.8 F (36.6 C)  Resp: 20    PEX General: alert, cooperative, and in no apparent distress HEENT: pupils equal round and reactive to light, vision grossly intact, oropharynx clear and non-erythematous  Neck: supple, no lymphadenopathy, JVD, or carotid bruits Lungs: clear to ascultation bilaterally, normal work of respiration, no wheezes, rales, ronchi Heart: regular rate and rhythm, no murmurs, gallops, or rubs Abdomen: soft, non-tender, non-distended, normal bowel sounds Msk: no joint edema, warmth, or erythema Extremities: no cyanosis, clubbing, or edema Neurologic: alert & oriented X3, cranial nerves  II-XII intact, strength grossly intact, sensation intact to light touch  Assessment/Plan

## 2011-10-30 NOTE — Assessment & Plan Note (Signed)
Well-controlled, continue current regimen 

## 2011-10-31 LAB — GLUCOSE, CAPILLARY: Glucose-Capillary: 92 mg/dL (ref 70–99)

## 2012-02-05 ENCOUNTER — Other Ambulatory Visit: Payer: Self-pay | Admitting: *Deleted

## 2012-02-05 MED ORDER — PRAVASTATIN SODIUM 40 MG PO TABS: 40.0000 mg | ORAL_TABLET | Freq: Every day | ORAL | Status: DC

## 2012-02-05 NOTE — Telephone Encounter (Signed)
Pt is out of this med 

## 2012-03-27 ENCOUNTER — Encounter: Payer: Self-pay | Admitting: Internal Medicine

## 2012-04-01 ENCOUNTER — Encounter: Payer: Self-pay | Admitting: Internal Medicine

## 2012-04-01 ENCOUNTER — Ambulatory Visit (INDEPENDENT_AMBULATORY_CARE_PROVIDER_SITE_OTHER): Payer: Self-pay | Admitting: Internal Medicine

## 2012-04-01 VITALS — BP 113/81 | HR 78 | Temp 98.1°F | Ht 65.0 in | Wt 320.4 lb

## 2012-04-01 DIAGNOSIS — I10 Essential (primary) hypertension: Secondary | ICD-10-CM

## 2012-04-01 DIAGNOSIS — M542 Cervicalgia: Secondary | ICD-10-CM

## 2012-04-01 DIAGNOSIS — Z23 Encounter for immunization: Secondary | ICD-10-CM

## 2012-04-01 DIAGNOSIS — D509 Iron deficiency anemia, unspecified: Secondary | ICD-10-CM

## 2012-04-01 DIAGNOSIS — Z0000 Encounter for general adult medical examination without abnormal findings: Secondary | ICD-10-CM

## 2012-04-01 DIAGNOSIS — M549 Dorsalgia, unspecified: Secondary | ICD-10-CM

## 2012-04-01 LAB — CBC WITH DIFFERENTIAL/PLATELET
Eosinophils Absolute: 0.3 10*3/uL (ref 0.0–0.7)
Hemoglobin: 12.2 g/dL (ref 12.0–15.0)
MCHC: 33.4 g/dL (ref 30.0–36.0)
Neutro Abs: 4.3 10*3/uL (ref 1.7–7.7)
Neutrophils Relative %: 56 % (ref 43–77)
Platelets: 295 10*3/uL (ref 150–400)
RDW: 15 % (ref 11.5–15.5)

## 2012-04-01 MED ORDER — HYDROCHLOROTHIAZIDE 25 MG PO TABS: 25.0000 mg | ORAL_TABLET | Freq: Every day | ORAL | Status: DC

## 2012-04-01 MED ORDER — MELOXICAM 7.5 MG PO TABS: 7.5000 mg | ORAL_TABLET | Freq: Every day | ORAL | Status: DC

## 2012-04-01 NOTE — Assessment & Plan Note (Signed)
BP currently well-controlled -continue current regimen

## 2012-04-01 NOTE — Patient Instructions (Signed)
Your neck discomfort is likely due to a muscle strain.  Take Meloxicam, 1 tablet daily for 2 weeks.  If you still feel the pain/discomfort, you can take 2 tablets per day.  We are giving you a flu shot today.  You may have some soreness in your arm at the site of the shot.  We are checking your blood levels today.  We will call you if the results are abnormal.  Please return for a follow-up visit in 6 months.

## 2012-04-01 NOTE — Progress Notes (Signed)
HPI The patient is a 61 y.o. yo female with a history of HTN, HL, anemia, presenting for a follow-up visit.  The patient notes a 3-week history of left neck "discomfort", which she notes is not painful, described as a pulling sensation, aggravated by moving neck.  The patient sleeps on her left side.  No change in symptoms in the last 3 weeks.  No trauma or heavy lifting.  No numbness, tingling, or weakness.  The patient has a history of HTN, but BP is well-controlled today.  Patient has no insurance.  She is given information on obtaining the orange card.  ROS: General: no fevers, chills, changes in weight, changes in appetite Skin: no rash HEENT: no blurry vision, hearing changes, sore throat Pulm: no dyspnea, coughing, wheezing CV: no chest pain, palpitations, shortness of breath Abd: no abdominal pain, nausea/vomiting, diarrhea/constipation GU: no dysuria, hematuria, polyuria Ext: see HPI Neuro: no weakness, numbness, or tingling  Filed Vitals:   04/01/12 1357  BP: 113/81  Pulse: 78  Temp: 98.1 F (36.7 C)    PEX General: alert, cooperative, and in no apparent distress HEENT: PERRL, EOMI, oropharynx clear and non-erythematous.  Neck: supple, no lymphadenopathy.  Left neck musculature non-tender to palpation, but "pulling" sensation is reproduced with lateral rotation of neck to the left Lungs: clear to ascultation bilaterally, normal work of respiration, no wheezes, rales, ronchi Heart: regular rate and rhythm, no murmurs, gallops, or rubs Abdomen: soft, non-tender, non-distended, normal bowel sounds Extremities: no cyanosis, clubbing, or edema Neurologic: alert & oriented X3, cranial nerves II-XII intact, strength grossly intact, sensation intact to light touch  Current Outpatient Prescriptions on File Prior to Visit  Medication Sig Dispense Refill  . aspirin 81 MG EC tablet Take 81 mg by mouth daily.        . hydrochlorothiazide (HYDRODIURIL) 25 MG tablet Take 1 tablet  (25 mg total) by mouth daily.  90 tablet  4  . Multiple Vitamin (MULTIVITAMIN) tablet Take 1 tablet by mouth daily.        . pravastatin (PRAVACHOL) 40 MG tablet Take 1 tablet (40 mg total) by mouth daily.  32 tablet  11    Assessment/Plan

## 2012-04-01 NOTE — Assessment & Plan Note (Signed)
-  rechecking CBC today

## 2012-04-01 NOTE — Assessment & Plan Note (Signed)
The patient's neck "pulling" sensation is likely due to an acute muscle strain, possibly from sleeping in an unusual position. -try meloxicam 7.5-15 mg/day x2 weeks

## 2012-04-01 NOTE — Assessment & Plan Note (Signed)
-   flu shot given today 

## 2012-05-03 ENCOUNTER — Encounter: Payer: Self-pay | Admitting: Internal Medicine

## 2012-05-05 ENCOUNTER — Encounter: Payer: Self-pay | Admitting: Internal Medicine

## 2012-07-29 ENCOUNTER — Encounter: Payer: Self-pay | Admitting: Internal Medicine

## 2012-07-29 ENCOUNTER — Ambulatory Visit (INDEPENDENT_AMBULATORY_CARE_PROVIDER_SITE_OTHER): Payer: Self-pay | Admitting: Internal Medicine

## 2012-07-29 VITALS — BP 127/87 | HR 70 | Temp 97.8°F | Ht 65.0 in | Wt 323.3 lb

## 2012-07-29 DIAGNOSIS — M5416 Radiculopathy, lumbar region: Secondary | ICD-10-CM

## 2012-07-29 LAB — COMPLETE METABOLIC PANEL WITH GFR
ALT: 21 U/L (ref 0–35)
BUN: 9 mg/dL (ref 6–23)
CO2: 30 mEq/L (ref 19–32)
Calcium: 9.6 mg/dL (ref 8.4–10.5)
Chloride: 99 mEq/L (ref 96–112)
Creat: 0.85 mg/dL (ref 0.50–1.10)
GFR, Est African American: 87 mL/min
GFR, Est Non African American: 75 mL/min
Glucose, Bld: 93 mg/dL (ref 70–99)

## 2012-07-29 LAB — POCT GLYCOSYLATED HEMOGLOBIN (HGB A1C): Hemoglobin A1C: 6

## 2012-07-29 MED ORDER — HYDROCHLOROTHIAZIDE 25 MG PO TABS: 25.0000 mg | ORAL_TABLET | Freq: Every day | ORAL | Status: DC

## 2012-07-29 NOTE — Assessment & Plan Note (Addendum)
The patient notes a 44-month history of bilateral feet tingling, which is positional when seated in a chair, and resolves with walking.  This likely represents a lumbar radiculopathy. -patient encouraged to try non-pharmacologic measures, such as using a chair with better lumbar support -unlikely DM-related, but given history of prediabetes years ago, will check A1C -if symptoms persist, we can try gabapentin in the future  Addendum: A1C = 6.0, still consistent with prediabetes.

## 2012-07-29 NOTE — Progress Notes (Signed)
HPI The patient is a 62 y.o. female with a history of HTN, HL, anemia, presenting for a routine follow-up visit.  The patient notes a 32-month history of bilateral LE "tingling" sensation, to the level of her ankles, which tends to occur after sitting at her desk for prolonged periods of time, and resolves after she gets up and walks around.  She notes no back pain, bowel/bladder incontinence, or LE numbness.  The patient has no history of DM, but notes a history of prediabetes years ago.  The patient notes sadness over several recent deaths in her family, noting that 6 people close to her have died in the last 1-2 years.  She notes that she is starting to feel better now, and her sleep is becoming more regular.  BP is well-controlled today.  ROS: General: no fevers, chills, changes in weight, changes in appetite Skin: no rash HEENT: no blurry vision, hearing changes, sore throat Pulm: no dyspnea, coughing, wheezing CV: no chest pain, palpitations, shortness of breath Abd: no abdominal pain, nausea/vomiting, diarrhea/constipation GU: no dysuria, hematuria, polyuria Ext: no arthralgias, myalgias Neuro: no weakness, numbness  Filed Vitals:   07/29/12 1531  BP: 127/87  Pulse: 70  Temp: 97.8 F (36.6 C)    PEX General: alert, cooperative, and in no apparent distress HEENT: pupils equal round and reactive to light, vision grossly intact, oropharynx clear and non-erythematous  Neck: supple, no lymphadenopathy Lungs: clear to ascultation bilaterally, normal work of respiration, no wheezes, rales, ronchi Heart: regular rate and rhythm, no murmurs, gallops, or rubs Abdomen: soft, non-tender, non-distended, normal bowel sounds Back: no ttp Extremities: no cyanosis, clubbing, or edema. Neurologic: alert & oriented X3, cranial nerves II-XII intact, strength 5/5 throughout, sensation intact to light touch throughout  Current Outpatient Prescriptions on File Prior to Visit  Medication Sig  Dispense Refill  . aspirin 81 MG EC tablet Take 81 mg by mouth daily.        . hydrochlorothiazide (HYDRODIURIL) 25 MG tablet Take 1 tablet (25 mg total) by mouth daily.  90 tablet  4  . meloxicam (MOBIC) 7.5 MG tablet Take 1 tablet (7.5 mg total) by mouth daily.  30 tablet  0  . Multiple Vitamin (MULTIVITAMIN) tablet Take 1 tablet by mouth daily.        . pravastatin (PRAVACHOL) 40 MG tablet Take 1 tablet (40 mg total) by mouth daily.  32 tablet  11   No current facility-administered medications on file prior to visit.    Assessment/Plan

## 2012-07-29 NOTE — Assessment & Plan Note (Signed)
BP Readings from Last 3 Encounters:  07/29/12 127/87  04/01/12 113/81  10/30/11 128/86    Lab Results  Component Value Date   NA 140 08/08/2010   K 4.0 08/08/2010   CREATININE 0.94 08/08/2010    Assessment:  Blood pressure control: controlled  Progress toward BP goal:  at goal  Comments: Controlled  Plan:  Medications:  continue current medications  Educational resources provided: brochure  Self management tools provided: home blood pressure logbook  Other plans: None

## 2012-07-29 NOTE — Patient Instructions (Signed)
General Instructions: Your blood pressure is well controlled today.  Continue to take HCTZ, 1 tablet once per day.  We are checking some routine labs today, including labs for diabetes.  If any of the labs are abnormal, I will call you.  Please return for a follow-up visit in 6 months.  Treatment Goals:  Goals (1 Years of Data) as of 07/29/12   None      Progress Toward Treatment Goals:  Treatment Goal 07/29/2012  Blood pressure at goal    Self Care Goals & Plans:  Self Care Goal 07/29/2012  Manage my medications take my medicines as prescribed; bring my medications to every visit; refill my medications on time  Monitor my health keep track of my blood pressure  Eat healthy foods eat foods that are low in salt; eat more vegetables  Be physically active find time in my schedule; take a walk every day       Care Management & Community Referrals:  Referral 07/29/2012  Referrals made for care management support none needed

## 2012-08-24 ENCOUNTER — Other Ambulatory Visit: Payer: Self-pay | Admitting: Internal Medicine

## 2012-12-16 ENCOUNTER — Encounter: Payer: Self-pay | Admitting: Internal Medicine

## 2013-02-09 ENCOUNTER — Other Ambulatory Visit: Payer: Self-pay | Admitting: *Deleted

## 2013-02-10 MED ORDER — PRAVASTATIN SODIUM 40 MG PO TABS: 40.0000 mg | ORAL_TABLET | Freq: Every day | ORAL | Status: DC

## 2013-02-19 ENCOUNTER — Encounter: Payer: Self-pay | Admitting: Internal Medicine

## 2013-02-19 ENCOUNTER — Observation Stay (HOSPITAL_COMMUNITY)
Admission: AD | Admit: 2013-02-19 | Discharge: 2013-02-21 | Disposition: A | Discharge status: Home / Self Care | Payer: BC Managed Care – PPO | Source: Ambulatory Visit | Attending: Internal Medicine | Admitting: Internal Medicine

## 2013-02-19 ENCOUNTER — Ambulatory Visit (INDEPENDENT_AMBULATORY_CARE_PROVIDER_SITE_OTHER): Payer: BC Managed Care – PPO | Admitting: Internal Medicine

## 2013-02-19 ENCOUNTER — Ambulatory Visit (HOSPITAL_COMMUNITY)
Admission: RE | Admit: 2013-02-19 | Discharge: 2013-02-19 | Disposition: A | Discharge status: Home / Self Care | Payer: BC Managed Care – PPO | Source: Ambulatory Visit | Attending: Internal Medicine | Admitting: Internal Medicine

## 2013-02-19 ENCOUNTER — Encounter (HOSPITAL_COMMUNITY): Payer: Self-pay | Admitting: *Deleted

## 2013-02-19 VITALS — BP 135/95 | HR 116 | Temp 98.7°F | Ht 64.0 in | Wt 322.4 lb

## 2013-02-19 DIAGNOSIS — D649 Anemia, unspecified: Secondary | ICD-10-CM | POA: Insufficient documentation

## 2013-02-19 DIAGNOSIS — K922 Gastrointestinal hemorrhage, unspecified: Secondary | ICD-10-CM

## 2013-02-19 DIAGNOSIS — K921 Melena: Secondary | ICD-10-CM | POA: Diagnosis present

## 2013-02-19 DIAGNOSIS — I10 Essential (primary) hypertension: Secondary | ICD-10-CM | POA: Diagnosis present

## 2013-02-19 DIAGNOSIS — K259 Gastric ulcer, unspecified as acute or chronic, without hemorrhage or perforation: Principal | ICD-10-CM | POA: Insufficient documentation

## 2013-02-19 DIAGNOSIS — Z6841 Body Mass Index (BMI) 40.0 and over, adult: Secondary | ICD-10-CM | POA: Diagnosis present

## 2013-02-19 DIAGNOSIS — E785 Hyperlipidemia, unspecified: Secondary | ICD-10-CM | POA: Insufficient documentation

## 2013-02-19 DIAGNOSIS — D509 Iron deficiency anemia, unspecified: Secondary | ICD-10-CM

## 2013-02-19 DIAGNOSIS — R55 Syncope and collapse: Secondary | ICD-10-CM

## 2013-02-19 DIAGNOSIS — Z8719 Personal history of other diseases of the digestive system: Secondary | ICD-10-CM

## 2013-02-19 DIAGNOSIS — R110 Nausea: Secondary | ICD-10-CM | POA: Insufficient documentation

## 2013-02-19 DIAGNOSIS — K2970 Gastritis, unspecified, without bleeding: Secondary | ICD-10-CM

## 2013-02-19 DIAGNOSIS — E669 Obesity, unspecified: Secondary | ICD-10-CM

## 2013-02-19 DIAGNOSIS — K625 Hemorrhage of anus and rectum: Secondary | ICD-10-CM | POA: Diagnosis present

## 2013-02-19 DIAGNOSIS — K2980 Duodenitis without bleeding: Secondary | ICD-10-CM | POA: Insufficient documentation

## 2013-02-19 DIAGNOSIS — Z8711 Personal history of peptic ulcer disease: Secondary | ICD-10-CM

## 2013-02-19 LAB — CBC
HCT: 29 % — ABNORMAL LOW (ref 36.0–46.0)
Hemoglobin: 9.6 g/dL — ABNORMAL LOW (ref 12.0–15.0)
MCV: 85.8 fL (ref 78.0–100.0)
MCV: 86.1 fL (ref 78.0–100.0)
Platelets: 328 10*3/uL (ref 150–400)
RDW: 15.4 % (ref 11.5–15.5)
RDW: 15.4 % (ref 11.5–15.5)
WBC: 11.8 10*3/uL — ABNORMAL HIGH (ref 4.0–10.5)
WBC: 7.9 10*3/uL (ref 4.0–10.5)

## 2013-02-19 LAB — BASIC METABOLIC PANEL
BUN: 24 mg/dL — ABNORMAL HIGH (ref 6–23)
CO2: 25 mEq/L (ref 19–32)
Calcium: 8.9 mg/dL (ref 8.4–10.5)
Chloride: 100 mEq/L (ref 96–112)
Creatinine, Ser: 0.89 mg/dL (ref 0.50–1.10)
Potassium: 3.5 mEq/L (ref 3.5–5.1)
Sodium: 136 mEq/L (ref 135–145)

## 2013-02-19 LAB — TROPONIN I: Troponin I: <0.3 ng/mL (ref <0.30)

## 2013-02-19 MED ORDER — PANTOPRAZOLE SODIUM 40 MG IV SOLR
40.0000 mg | Freq: Two times a day (BID) | INTRAVENOUS | Status: DC
  Administered 2013-02-19 (×2): 40 mg via INTRAVENOUS
  Filled 2013-02-19 (×4): qty 40

## 2013-02-19 MED ORDER — INFLUENZA VAC SPLIT QUAD 0.5 ML IM SUSP
0.5000 mL | INTRAMUSCULAR | Status: AC
  Administered 2013-02-20: 0.5 mL via INTRAMUSCULAR
  Filled 2013-02-19: qty 0.5

## 2013-02-19 MED ORDER — SODIUM CHLORIDE 0.9 % IV SOLN
INTRAVENOUS | Status: DC
  Administered 2013-02-19 – 2013-02-21 (×4): via INTRAVENOUS

## 2013-02-19 MED ORDER — MORPHINE SULFATE 2 MG/ML IJ SOLN: 1.0000 mg | INTRAMUSCULAR | Status: DC | PRN

## 2013-02-19 MED ORDER — ONDANSETRON HCL 4 MG PO TABS: 4.0000 mg | ORAL_TABLET | Freq: Four times a day (QID) | ORAL | Status: DC | PRN

## 2013-02-19 MED ORDER — ONDANSETRON HCL 4 MG/2ML IJ SOLN: 4.0000 mg | Freq: Four times a day (QID) | INTRAMUSCULAR | Status: DC | PRN

## 2013-02-19 MED ORDER — PANTOPRAZOLE SODIUM 40 MG IV SOLR
40.0000 mg | Freq: Every day | INTRAVENOUS | Status: DC
  Filled 2013-02-19: qty 40

## 2013-02-19 NOTE — H&P (Signed)
Date: 02/19/2013               Patient Name:  [name] MRN: [id number]  DOB: [birth year] Age / Sex: 62 y.o., female   PCP: Linward Headland, MD              Medical Service: Internal Medicine Teaching Service              Attending Physician: Dr. Burns Spain, MD    First Contact: Roland Rack Pager: [phone]  Second Contact: Dr. Mariea Clonts  Pager: [phone]  Third Contact Dr. Sherrine Maples Pager: [phone]       After Hours (After 5p/  First Contact Pager: [phone]  weekends / holidays): Second Contact Pager: [phone]   Chief Complaint:Dark stools and Syncope.Marland Kitchen   History of Present Illness:. H&P:[name] 62 year old female with PMH of HTN, hyperlipidemia came to clinic  And reported that yesterday(02/18/2013 )evening suddenly she had nausea, felt like throwing up and felt very hot had one large bowel movement and after that she feel on ground for 10 min,(but pt could not able to tell weather she lost consciousness/not and since then she feeling light headed from sitting to standing postion. later her daughter noticed that stool was large and black color.she has had one dark stool this morning before she comes to clinic and admitted in ward for further evaluation of malena and to monitor her Hemoglobin and vitals.  She c/o discomfort in belly since yesterday morning and it is all over belly but pt locating more to epigastrium and rt upper quadrant.  Denies epigastric reflux, vomiting fever, previous h/o melena, hematemesis.had colonoscopy in 2006 significant for Hyperplastic (no adenomatous /malignant changes), she takes aspirin 81 mg and last week she took advil couple of times. ROS: WNL.  Meds: Current Facility-Administered Medications  Medication Dose Route Frequency Provider Last Rate Last Dose  . 0.9 %  sodium chloride infusion   Intravenous Continuous Genelle Gather, MD      . Melene Muller ON 02/20/2013] influenza vac split quadrivalent PF (FLUARIX) injection 0.5 mL  0.5 mL  Intramuscular Tomorrow-1000 Burns Spain, MD      . morphine 2 MG/ML injection 1 mg  1 mg Intravenous Q4H PRN Genelle Gather, MD      . ondansetron [facility]) tablet 4 mg  4 mg Oral Q6H PRN Genelle Gather, MD       Or  . ondansetron [facility]) injection 4 mg  4 mg Intravenous Q6H PRN Genelle Gather, MD      . pantoprazole (PROTONIX) injection 40 mg  40 mg Intravenous Q12H Genelle Gather, MD        Allergies: Allergies as of 02/19/2013  . (No Known Allergies)   Past Medical History  Diagnosis Date  . Right knee pain   . Lumbar back pain   . Hypertension   . Hyperlipidemia   . Obesity     morbid obesity  . Premenopausal menorrhagia   . Iron deficiency anemia     postmenopausal bleeding followed by Dr. Okey Dupre.  Hx of benign endometrial polyp.   . Endometrial polyp     bemogm 12/06  . Elevated LFTs     Resolved- etiology unclear hep B, Hep C serology neg 2005  . Colonic polyp     Hyperplastic, no adenomatous or malignant changes.   . Acanthosis nigricans   . Hx of abnormal cervical Pap smear     1980 normal in 2006.   Marland Kitchen  Allergic rhinitis    Past Surgical History  Procedure Laterality Date  . Therapeutic abortion  1975  . Colonoscopy     Family History  Problem Relation Age of Onset  . Hypertension Mother   . Alcohol abuse Father   . Hypertension Father   . Heart disease Father   . Hypertension Sister   . Hypertension Sister    History   Social History  . Marital Status: Divorced    Spouse Name: N/A    Number of Children: N/A  . Years of Education: N/A   Occupational History  . paralegal    Social History Main Topics  . Smoking status: Former Smoker -- 10 years    Types: Cigarettes    Quit date: 07/18/1981  . Smokeless tobacco: Not on file  . Alcohol Use: No  . Drug Use: No  . Sexual Activity: No   Other Topics Concern  . 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     Review of Systems: Pertinent items are noted in HPI.  Physical Exam: Blood pressure 114/84, pulse 89, temperature 98.6 F (37 C), temperature source Oral, height 5\' 4"  (1.626 m), weight 144.516 kg (318 lb 9.6 oz), SpO2 96.00%. BP 114/84  Pulse 89  Temp(Src) 98.6 F (37 C) (Oral)  Resp 18  Ht 5\' 4"  (1.626 m)  Wt 144.516 kg (318 lb 9.6 oz)  BMI 54.66 kg/m2  SpO2 96%  General Appearance:    Alert, cooperative, no distress, appears stated age  Head:    Normocephalic, without obvious abnormality, atraumatic  Eyes:    PERRL, conjunctiva/corneas clear, EOM's intact, fundi    benign, both eyes  Ears:    Normal TM's and external ear canals, both ears  Nose:   Nares normal, septum midline, mucosa normal, no drainage    or sinus tenderness  Throat:   Lips, mucosa, and tongue normal; teeth and gums normal  Neck:   Supple, symmetrical, trachea midline, no adenopathy;    thyroid:  no enlargement/tenderness/nodules; no carotid   bruit or JVD  Back:     Symmetric, no curvature, ROM normal, no CVA tenderness  Lungs:     Clear to auscultation bilaterally, respirations unlabored  Chest Wall:    No tenderness or deformity   Heart:    Regular rate and rhythm, S1 and S2 normal, no murmur, rub   or gallop  Breast Exam:    No tenderness, masses, or nipple abnormality  Abdomen:     Soft, non-tender, bowel sounds active all four quadrants,    no masses, no organomegaly  Genitalia:    Normal female without lesion, discharge or tenderness  Rectal:    Normal tone, normal prostate, no masses or tenderness;   guaiac negative stool  Extremities:   Extremities normal, atraumatic, no cyanosis or edema  Pulses:   2+ and symmetric all extremities  Skin:   Skin color, texture, turgor normal, no rashes or lesions  Lymph nodes:   Cervical, supraclavicular, and axillary nodes normal  Neurologic:   CNII-XII intact, normal strength, sensation and reflexes    throughout   General:Pt alert, cooperative and no  acute distress and well nourished. HEENT: head normocephalic, atraumatic. Neck:supple, no lymphadenopathy and no thyromegaly. Eyes:pupils equal  Reacting to light. No conjunctival pallor. Mouth and Pharynx: moist mucous membranes,lips not cracked and not dry.pharynx WNL. Heart: regular rate and rhythm, s1s2  heard  No murmurs. Lungs: clear breath sounds bilaterally and no wheezing. Abdomen:obese, soft non tender, no guarding and rigidity and no rebound tenderness.BS+  Lab results: @LABTEST @  Imaging results:  No results found.  Other results: EKG: normal EKG, normal sinus rhythm, unchanged from previous tracings.  Assessment & Plan by Problem: Active Problems:   * No active hospital problems. * Assessment and plan: Billy Rocco 62 year old female with pmh of HTN, hyperlipidemia admitted in ward with 2 episodes of melena and 1 episode of syncope. Active problems: Melena: she had 2 dark stools since yesterday. And her Hb today is 11 and her wbc 11.8 so repeat CBC for every 8 hours. To rule out peptic ulcer disease  NPO from night and planned EGD tomorrow@ 9 am  She is On pantoprazole (PROTONIX) injection 40 mg  Injection iv q 24 hourly and ondansetron 4 mg injection. Q6h PRN for nausea  And BMP tomorrow morning Syncope: monitor vitals.  Other problems: Hypertension:takes HCTZ 25mg  1 pill every day and started on 2/26/14and aspirin 81 mg po every day.  Hyperlipidemia: takes parvastatin 40mg  po every day started on 02/09/2013     Plan /day is monitor CBC every 8 hours, BMP, Troponin.NPO from mid night and EGD tomorrow morniing.          This is a Psychologist, occupational Note.  The care of the patient was discussed with Dr. Dorna Leitz. and the assessment and plan was formulated with their assistance.  Please see their note for official documentation of the patient encounter.   Signed: Roland Rack, Med Student 02/19/2013, 3:25 PM

## 2013-02-19 NOTE — Progress Notes (Signed)
Patient ID: [name], female   DOB: 20-Jan-1951, 62 y.o.   MRN: [id number] Subjective:   Patient ID: [name] female   DOB: 10-21-1950 62 y.o.   MRN: [id number]  CC:   Acute visit due to syncope HPI:  [name] is a 62 y.o. lady with past medical history as outlined below, who present for an acute visit today.  Patient reports that she had abdominal uncomfortableness in the morning on 9/18. She took over-the-counter "Epson" (laxative) for possible constipation and several cups of apple juice in the Am. She felt a little better and went to work. After she came home from working, she still had mild abdominal uncomfortableness. She cooked food, but did not eat anything. At about 8:30 PM, she went to the bathroom. While she was siting on the toilet, she suddently felt nauseated and hot in her whole body. She was about to vomit, but did not vomit fanally. The, she fell on the ground. She is not very sure whether he lost consciousness. She said her dog's barking alerted her daughter who finally have the patient get up. The whole event last for approximately in 10 minutes. She did not lose control of  bowel moment. She did not have urinary incontinence. She did not did bite her tonug,. After she woke up, she felt dizzy, but not confused. She is very sure that she was mentally very clear after she woke up from the event. she did not have history of seizure. No history of diabetes. She is currently not taking any diabetic medication. She did not have palpitations before the event. She did not have shortness of breath or chest pain. Of note, she reports that she had a very large volume of black stool in the toilet.  Today, the patient reports that she had a bowel movement with a little black stool in the AM. Patient still has mild abdominal unconformableness. No chest pain or shortness of breath. She has little lightheadedness today. She does not have diarrhea, nausea or vomiting today.  ROS:   Denies fever, chills, headaches, cough, chest pain, SOB, diarrhea, constipation, dysuria, urgency, frequency, hematuria.   Past Medical History  Diagnosis Date  . Right knee pain   . Lumbar back pain   . Hypertension   . Hyperlipidemia   . Obesity   . Premenopausal menorrhagia   . Iron deficiency anemia     postmenopausal bleeding followed by Dr. Okey Dupre.  Hx of benign endometrial polyp.   . Endometrial polyp     bemogm 12/06  . Elevated LFTs     Resolved- etiology unclear hep B, Hep C serology neg 2005  . Colonic polyp     Hyperplastic, no adenomatous or malignant changes.   . Acanthosis nigricans   . Hx of abnormal cervical Pap smear     1980 normal in 2006.   Marland Kitchen Allergic rhinitis    Current Outpatient Prescriptions  Medication Sig Dispense Refill  . aspirin 81 MG EC tablet Take 81 mg by mouth daily.        . hydrochlorothiazide (HYDRODIURIL) 25 MG tablet Take 1 tablet (25 mg total) by mouth daily.  90 tablet  4  . Multiple Vitamin (MULTIVITAMIN) tablet Take 1 tablet by mouth daily.        . pravastatin (PRAVACHOL) 40 MG tablet Take 1 tablet (40 mg total) by mouth daily.  93 tablet  4   No current facility-administered medications for this visit.   Family History  Problem Relation  Age of Onset  . Hypertension Mother   . Alcohol abuse Father   . Hypertension Father   . Heart disease Father   . Hypertension Sister   . Hypertension Sister    History   Social History  . Marital Status: Divorced    Spouse Name: N/A    Number of Children: N/A  . Years of Education: N/A   Occupational History  . paralegal    Social History Main Topics  . Smoking status: Former Smoker -- 10 years    Types: Cigarettes    Quit date: 07/18/1981  . Smokeless tobacco: None  . Alcohol Use: No  . Drug Use: No  . Sexual Activity: No   Other Topics Concern  . 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    Review of  Systems: Full 14-point review of systems otherwise negative. See HPI.   Objective:  Physical Exam: Filed Vitals:   02/19/13 1045 02/19/13 1130 02/19/13 1132 02/19/13 1134  BP: 124/89 120/87 141/96 135/95  Pulse: 101 104 96 116  Temp: 98.7 F (37.1 C)     TempSrc: Oral     Height: 5\' 4"  (1.626 m)     Weight: 322 lb 6.4 oz (146.24 kg)     SpO2: 98%      Constitutional: Vital signs reviewed.  Obses HEENT:  Head: Normocephalic and atraumatic Mouth: no erythema or exudates, MMM Eyes: PERRL, EOMI, conjunctivae normal, No scleral icterus.  Neck: Supple, Trachea midline normal ROM, No JVD  Cardiovascular: RRR, S1 normal, S2 normal, no MRG, pulses symmetric and intact bilaterally Pulmonary/Chest: CTAB, no wheezes, rales, or rhonchi Abdominal: Soft. Non-tender, non-distended, bowel sounds are normal, no masses, organomegaly, or guarding present.  GU: no CVA tenderness Musculoskeletal: No joint deformities, erythema, or stiffness, ROM full and non-tender Extremities: No leg edema Hematology: no cervical, inginal, or axillary adenopathy.  Neurological: A&O x3, Strength is normal and symmetric bilaterally, cranial nerve II-XII are grossly intact, no focal motor deficit, sensory intact to light touch bilaterally. Skin: Warm, dry and intact. No rash, cyanosis, or clubbing.  Psychiatric: Normal mood and affect. No suicidal or homicidal ideation. Rectal exam:  No hemorrhoid. FOBT is postitive  Assessment & Plan:

## 2013-02-19 NOTE — Consult Note (Signed)
[facility] Consult: 3:07 PM 02/19/2013   Referring Provider: Dr Butcher  Primary Care Physician:  BROWN, RYAN, MD Primary Gastroenterologist:  Dr. Carl Gessner:  2006 screening colonoscopy  Reason for Consultation:  Melenic stool  HPI: [name] is a 62 y.o. female.  Admitted from outpt clinic today. Felt slightly unsettled stomach and previous day's BM was brown but small.  Took epsom salt and had good-sixed brown stool.  Stomach still felt unsettled throughout day.  About 8 15, after returning from church, went to commode, had a BM and was very dizzy, diaphoretic, felt chills.  Had syncopal spell.  When dtr found her she had been down less than 10 minutes and was still dizzy but very alert.  Stool in commode was black.  She stayed home, declined dtr's suggestion to call EMS or be brought to hospital by family.  This AM still dizzy, small black stool.  Went to PMD clinic and had rectal showing FOB+ stool.  Admitted to hospital.    hgb down 1.2 grams, at 11.0, c/w one year ago.  Awaiting chemistries.  Took aleve once 4 days ago, takes daily baby ASA.  Normally uses aleve rarely.   No hx EGD or ulcers.        Past Medical History  Diagnosis Date  . Right knee pain   . Lumbar back pain   . Hypertension   . Hyperlipidemia   . Obesity   . Premenopausal menorrhagia   . Iron deficiency anemia     postmenopausal bleeding followed by Dr. Rose.  Hx of benign endometrial polyp.   . Endometrial polyp     bemogm 12/06  . Elevated LFTs     Resolved- etiology unclear hep B, Hep C serology neg 2005  . Colonic polyp     Hyperplastic, no adenomatous or malignant changes.   . Acanthosis nigricans   . Hx of abnormal cervical Pap smear     1980 normal in 2006.   . Allergic rhinitis     Past Surgical History  Procedure Laterality Date  . Therapeutic abortion  1975  . Colonoscopy      Prior to Admission medications   Medication Sig  Start Date End Date Taking? Authorizing Provider  aspirin 81 MG EC tablet Take 81 mg by mouth daily.      Historical Provider, MD  hydrochlorothiazide (HYDRODIURIL) 25 MG tablet Take 1 tablet (25 mg total) by mouth daily. 07/29/12   Ryan K Brown, MD  Multiple Vitamin (MULTIVITAMIN) tablet Take 1 tablet by mouth daily.      Historical Provider, MD  pravastatin (PRAVACHOL) 40 MG tablet Take 1 tablet (40 mg total) by mouth daily. 02/09/13   Ryan K Brown, MD    Scheduled Meds: . [START ON 02/20/2013] influenza vac split quadrivalent PF  0.5 mL Intramuscular Tomorrow-1000  . pantoprazole (PROTONIX) IV  40 mg Intravenous Q12H   Infusions: . sodium chloride     PRN Meds: morphine injection, ondansetron (ZOFRAN) IV, ondansetron   Allergies as of 02/19/2013  . (No Known Allergies)    Family History  Problem Relation Age of Onset  . Hypertension Mother   . Alcohol abuse Father   . Hypertension Father   . Heart disease Father   . Hypertension Sister   . Hypertension Sister     History   Social History  . Marital Status: Divorced    Spouse Name: N/A    Number of Children: N/A  . Years of Education: N/A     Occupational History  . paralegal    Social History Main Topics  . Smoking status: Former Smoker -- 10 years    Types: Cigarettes    Quit date: 07/18/1981  . Smokeless tobacco: Not on file  . Alcohol Use: No  . Drug Use: No  . Sexual Activity: No   Other Topics Concern  . 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    REVIEW OF SYSTEMS: Weight increase in last year No nose bleeds No heartburn No dysphagia No cough or dyspnea No ETOH No hx transfusions.  No pedal edema No hx disbetes.    PHYSICAL EXAM: Vital signs in last 24 hours: Temp:  [98.6 F (37 C)-98.7 F (37.1 C)] 98.6 F (37 C) (09/19 1300) Pulse Rate:  [89-116] 89 (09/19 1300) BP: (114-141)/(84-96) 114/84 mmHg (09/19  1300) SpO2:  [96 %-98 %] 96 % (09/19 1300) Weight:  [144.516 kg (318 lb 9.6 oz)-146.24 kg (322 lb 6.4 oz)] 144.516 kg (318 lb 9.6 oz) (09/19 1300)  General: pleasant, obese AAF.  comfortable Head:  No asymmetry or swelling  Eyes:  No icterus or pallor Ears:  Not HOH  Nose:  No discharge no congestion Mouth:  Clear and moist oral mm.  Good teeth Neck:  No mass or JVD.  No TMG Lungs:  Clear.  Good BS bil. Heart: RRR.  No MRG Abdomen:  Soft, not tender.  Obese, not distended.  BS active.   Rectal: did not reppeat   Musc/Skeltl: no joint contractures Extremities:  No pedal edema  Neurologic:  Pleasant.  Oriented x 3. No gross deficits.  Good historian. Skin:  No rash, sores. Tattoos:  none Nodes:  No cervical adenopathy   Psych:  Pleasant, not depressed or agitated.   Intake/Output from previous day:   Intake/Output this shift:    LAB RESULTS:  Recent Labs  02/19/13 1108  WBC 11.8*  HGB 11.0*  HCT 32.5*  PLT 328  MCV    85  BMET Lab Results  Component Value Date   NA 138 07/29/2012   NA 140 08/08/2010   NA 144 06/12/2009   K 3.6 07/29/2012   K 4.0 08/08/2010   K 4.1 06/12/2009   CL 99 07/29/2012   CL 101 08/08/2010   CL 106 06/12/2009   CO2 30 07/29/2012   CO2 28 08/08/2010   CO2 24 06/12/2009   GLUCOSE 93 07/29/2012   GLUCOSE 102* 08/08/2010   GLUCOSE 94 06/12/2009   BUN 9 07/29/2012   BUN 10 08/08/2010   BUN 11 06/12/2009   CREATININE 0.85 07/29/2012   CREATININE 0.94 08/08/2010   CREATININE 0.95 06/12/2009   CALCIUM 9.6 07/29/2012   CALCIUM 8.9 08/08/2010   CALCIUM 9.2 06/12/2009   LFT No results found for this basename: PROT, ALBUMIN, AST, ALT, ALKPHOS, BILITOT, BILIDIR, IBILI,  in the last 72 hours PT/INR No results found for this basename: INR,  PROTIME    RADIOLOGY STUDIES: No results found.  ENDOSCOPIC STUDIES: 05/2005  Colonoscopy  Dr Gessner Unable to find report. Scanned path report with 2 sigmoid hyperplastic colon polyps.    IMPRESSION: *  Nausea, melenic  stool. Rule out PUD.  *  Syncope.  *  Normocytic anemia.  Hgb was 12.2 one year ago, now 11.0 *  Hx hyperplastic colon polyps.   PLAN: *  Added Protonix 40 mg IV q 24 hours *    EGD 9 AM tomorrow.  Clears if  Tolerated tonight.    LOS: 0 days   Sarah Gribbin  02/19/2013, 3:07 PM Pager: [phone]     ________________________________________________________________________  [facility] MD note:  I personally examined the patient, reviewed the data and agree with the assessment and plan described above.  Melenic stool last night and also today once.  Hb a bit lower than previously.  Takes very few NSAIDs.  Will plan on EGD tomorrow (PUD, gastritis?)   Daniel Jacobs, MD  Gastroenterology Pager [phone]  

## 2013-02-19 NOTE — Progress Notes (Signed)
Admission note: Late Entry  Arrival Method: Direct admit from internal medicine clinic at 1300. Mental Status: A&Ox4 Telemetry: N/A  Skin: Intact.  Tubes: N/A IV: Placed by IV team - LAFA 9/919/14 NS@125ml /hr Pain: 0/10  Family: At bedside. Living Situation: Lives with children. Safety Measures: Call bell within reach. 5W Orientation: Oriented to unit and surroundings.  [name], [name]

## 2013-02-19 NOTE — H&P (Signed)
Date: 02/19/2013               Patient Name:  [name] MRN: [id number]  DOB: 07-22-1950 Age / Sex: 62 y.o., female   PCP: Linward Headland, MD         Medical Service: Internal Medicine Teaching Service         Attending Physician: Dr. Burns Spain, MD    First Contact: Dr. Mariea Clonts Pager: [phone]  Second Contact: Dr. Sherrine Maples Pager: [phone]       After Hours (After 5p/  First Contact Pager: [phone]  weekends / holidays): Second Contact Pager: [phone]   Chief Complaint: Melena  History of Present Illness: 70 y o F with PMH- Hyperlipidemia, HTN, Menorrhagia. Presented to the clinic today with c/o black stools yesterday evening. Patient said she was feeling nauseous with some mild abd discomfort, and suddenly had a warm feeling all over her body, had a bowel movemnet and passed out, for a few mins. Thyere was no tongue biting, urinary or fecal incontinence, and pt said she was fine after the episode.Patient said she did not check the colour of her stool, but her daughter saw it, and reported that pts stool was very dark in colour. Pt notes occasiona l dizziness whne she gets up from the bed. This morning, patinet had a nother bowel movement, very small quantiotyy which was also dark. No previous episodes of dark stools, no vomiting, no signif use of NSAIDs- took advil- 2 tabs 3times a day about a week ago, has had a colonoscopy in the past- a polyp was seen, thinks she was told to come back in 10years. patinet stilll has some abdominal discomfort.  In clinic FOBT done was Positive, CBC- showed- HB of 11.8, baseline- 12.5.   Meds: Current Facility-Administered Medications  Medication Dose Route Frequency Provider Last Rate Last Dose  . 0.9 %  sodium chloride infusion   Intravenous Continuous Genelle Gather, MD      . Melene Muller ON 02/20/2013] influenza vac split quadrivalent PF (FLUARIX) injection 0.5 mL  0.5 mL Intramuscular Tomorrow-1000 Burns Spain, MD      . morphine 2 MG/ML  injection 1 mg  1 mg Intravenous Q4H PRN Genelle Gather, MD      . ondansetron [facility]) tablet 4 mg  4 mg Oral Q6H PRN Genelle Gather, MD       Or  . ondansetron [facility]) injection 4 mg  4 mg Intravenous Q6H PRN Genelle Gather, MD      . pantoprazole (PROTONIX) injection 40 mg  40 mg Intravenous Q12H Genelle Gather, MD        Allergies: Allergies as of 02/19/2013  . (No Known Allergies)   Past Medical History  Diagnosis Date  . Right knee pain   . Lumbar back pain   . Hypertension   . Hyperlipidemia   . Obesity     morbid obesity  . Premenopausal menorrhagia   . Iron deficiency anemia     postmenopausal bleeding followed by Dr. Okey Dupre.  Hx of benign endometrial polyp.   . Endometrial polyp     bemogm 12/06  . Elevated LFTs     Resolved- etiology unclear hep B, Hep C serology neg 2005  . Colonic polyp     Hyperplastic, no adenomatous or malignant changes.   . Acanthosis nigricans   . Hx of abnormal cervical Pap smear     1980 normal in 2006.   Marland Kitchen Allergic  rhinitis    Past Surgical History  Procedure Laterality Date  . Therapeutic abortion  1975  . Colonoscopy     Family History  Problem Relation Age of Onset  . Hypertension Mother   . Alcohol abuse Father   . Hypertension Father   . Heart disease Father   . Hypertension Sister   . Hypertension Sister    History   Social History  . Marital Status: Divorced    Spouse Name: N/A    Number of Children: N/A  . Years of Education: N/A   Occupational History  . paralegal    Social History Main Topics  . Smoking status: Former Smoker -- 10 years    Types: Cigarettes    Quit date: 07/18/1981  . Smokeless tobacco: Not on file  . Alcohol Use: No  . Drug Use: No  . Sexual Activity: No   Other Topics Concern  . 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    Review of Systems: Review of system No significant finding on  ROS.  Physical Exam: Blood pressure 114/84, pulse 89, temperature 98.6 F (37 C), temperature source Oral, resp. rate 18, height 5\' 4"  (1.626 m), weight 318 lb 9.6 oz (144.516 kg), SpO2 96.00%. . GENERAL- alert, co-operative, appears as stated age, not in any distress. HEENT- Atraumatic, normocephalic, PERRL, EOMI, oral mucosa appears moist, good and intact dentition. No cervical LN enlargement, thyroid does not appear enlarged. CARDIAC- RRR, no murmurs, rubs or gallops. RESP- Moving equal volumes of air, and clear to auscultation bilaterally. ABDOMEN- Soft,obese non tender, no palpable masses or organomegaly, bowel sounds present. BACK- Normal curvature of the spine, No tenderness along the vertebrae, no CVA tenderness. NEURO- Cr N 2-12 intact, strenght equal and present in all extremities EXTREMITIES- pulse 2+, symmetric. SKIN- Warm, dry, No rash or lesion. PSYCH- Normal mood and affect, appropriate thought content and speech.  Lab results: Basic Metabolic Panel:  Recent Labs  16/10/96 1637  NA 136  K 3.5  CL 100  CO2 25  GLUCOSE 108*  BUN 24*  CREATININE 0.89  CALCIUM 8.9   CBC:  Recent Labs  02/19/13 1108  WBC 11.8*  HGB 11.0*  HCT 32.5*  MCV 85.8  PLT 328   Cardiac Enzymes:  Recent Labs  02/19/13 1634  TROPONINI <0.30   Other results: EKG: ekg findings:normal EKG, normal sinus rhythm,rate- 92bmp, regular sinus rhythm, QTc - 442, no ST or T wave changes suggestive of ischemia. No previous tracing,   Assessment & Plan by Problem:  Melena- Likely due to upper GI bleed- PUD or duod ulcer, gastritis, but no previous hx of GI bleed, or upper abd pain and no hx of signif  NSAID use or steriod use. No hx of liver dx and last LFTs- 07/29/2012- no abnormality, so unlikely patient has lower eophageal varices. Consideration for Gastric malig, but no hx of weightloss, will be r/o by EGD findings.  - GI consult- Recs appreciated. - Protonix 40mg  iv q24. - EGD  tomorrow. - BMP tomorrow morning. - CBC q8h. - Odansetron 4mg  Q6h. - Diet clear for now, NPO from Midnight. - iv morphine- 1mg  Q4h.  # HTN- Home meds- HCT- 25mg  daily. Bp currently- Sys- 100s, diastolic in the 80s. - Will hole BP meds for now, also considering episodes of dizziness.    # Obesity- BMI calc at 54.6 - On discharge advice  pt about healthier eating habits, exercise and weight loss.  # Code- Full.  # DVT ppx- SCDs.  Dispo: Disposition is deferred at this time, awaiting improvement of current medical problems. Anticipated discharge in approximately 1-2 day(s).   The patient does have a current PCP Linward Headland, MD) and does need an [facility] hospital follow-up appointment after discharge.  The patient does not know have transportation limitations that hinder transportation to clinic appointments.  Signed: Kennis Carina, MD 02/19/2013, 5:37 PM

## 2013-02-19 NOTE — Assessment & Plan Note (Signed)
Patient's symptoms are most likely due to massive GI bleeding. Her FOBT is positive. She had large volume of black stool and had an another bowel movement with black stool today. Although hemoglobin is 11.0, patient is tachycardia with heart rate of 101/min. It is concerning that the patient may have active upper bleeding. It may take several hours to have the hemoglobin be balanced to be seen on CBC test. Currently patient is hemodynamically stable. Stat EKG has no ischemic change. I discussed with Dr. Dalphine Handing. We decide to admit patient and consult to GI for possible endoscope.   -will keep NPO from now. -will hold ASA -consult to GI -I spoke with admission team, patient will be admitted for observation.

## 2013-02-20 ENCOUNTER — Encounter (HOSPITAL_COMMUNITY)
Admission: AD | Disposition: A | Discharge status: Home / Self Care | Payer: Self-pay | Source: Ambulatory Visit | Attending: Internal Medicine

## 2013-02-20 ENCOUNTER — Encounter (HOSPITAL_COMMUNITY): Payer: Self-pay

## 2013-02-20 DIAGNOSIS — K2970 Gastritis, unspecified, without bleeding: Secondary | ICD-10-CM

## 2013-02-20 DIAGNOSIS — K922 Gastrointestinal hemorrhage, unspecified: Secondary | ICD-10-CM

## 2013-02-20 DIAGNOSIS — K259 Gastric ulcer, unspecified as acute or chronic, without hemorrhage or perforation: Secondary | ICD-10-CM

## 2013-02-20 DIAGNOSIS — K921 Melena: Secondary | ICD-10-CM

## 2013-02-20 DIAGNOSIS — K254 Chronic or unspecified gastric ulcer with hemorrhage: Secondary | ICD-10-CM

## 2013-02-20 DIAGNOSIS — Z8719 Personal history of other diseases of the digestive system: Secondary | ICD-10-CM

## 2013-02-20 HISTORY — DX: Chronic or unspecified gastric ulcer with hemorrhage: K25.4

## 2013-02-20 HISTORY — DX: Gastric ulcer, unspecified as acute or chronic, without hemorrhage or perforation: K25.9

## 2013-02-20 HISTORY — PX: ESOPHAGOGASTRODUODENOSCOPY: SHX5428

## 2013-02-20 LAB — BASIC METABOLIC PANEL
BUN: 16 mg/dL (ref 6–23)
CO2: 25 mEq/L (ref 19–32)
Chloride: 104 mEq/L (ref 96–112)
Creatinine, Ser: 0.88 mg/dL (ref 0.50–1.10)
GFR calc Af Amer: 81 mL/min — ABNORMAL LOW (ref >90)
Glucose, Bld: 110 mg/dL — ABNORMAL HIGH (ref 70–99)
Potassium: 4.1 mEq/L (ref 3.5–5.1)
Sodium: 138 mEq/L (ref 135–145)

## 2013-02-20 LAB — CBC
HCT: 28.9 % — ABNORMAL LOW (ref 36.0–46.0)
Hemoglobin: 9.5 g/dL — ABNORMAL LOW (ref 12.0–15.0)
MCH: 28.4 pg (ref 26.0–34.0)
MCHC: 32.9 g/dL (ref 30.0–36.0)
MCV: 86.5 fL (ref 78.0–100.0)
RDW: 15.5 % (ref 11.5–15.5)

## 2013-02-20 LAB — URINALYSIS, ROUTINE W REFLEX MICROSCOPIC
Bilirubin Urine: NEGATIVE
Glucose, UA: NEGATIVE mg/dL
Ketones, ur: NEGATIVE mg/dL
Nitrite: NEGATIVE
Protein, ur: NEGATIVE mg/dL
Specific Gravity, Urine: 1.013 (ref 1.005–1.030)
Urobilinogen, UA: 0.2 mg/dL (ref 0.0–1.0)

## 2013-02-20 LAB — URINE MICROSCOPIC-ADD ON

## 2013-02-20 SURGERY — EGD (ESOPHAGOGASTRODUODENOSCOPY)
Anesthesia: Moderate Sedation

## 2013-02-20 MED ORDER — FENTANYL CITRATE 0.05 MG/ML IJ SOLN
INTRAMUSCULAR | Status: DC | PRN
  Administered 2013-02-20: 25 ug via INTRAVENOUS

## 2013-02-20 MED ORDER — DIPHENHYDRAMINE HCL 50 MG/ML IJ SOLN
INTRAMUSCULAR | Status: AC
  Filled 2013-02-20: qty 1

## 2013-02-20 MED ORDER — MIDAZOLAM HCL 5 MG/ML IJ SOLN
INTRAMUSCULAR | Status: AC
  Filled 2013-02-20: qty 2

## 2013-02-20 MED ORDER — FENTANYL CITRATE 0.05 MG/ML IJ SOLN
INTRAMUSCULAR | Status: AC
  Filled 2013-02-20: qty 2

## 2013-02-20 MED ORDER — BUTAMBEN-TETRACAINE-BENZOCAINE 2-2-14 % EX AERO
INHALATION_SPRAY | CUTANEOUS | Status: DC | PRN
  Administered 2013-02-20: 09:00:00 2 via TOPICAL

## 2013-02-20 MED ORDER — PANTOPRAZOLE SODIUM 40 MG PO TBEC
40.0000 mg | DELAYED_RELEASE_TABLET | Freq: Two times a day (BID) | ORAL | Status: DC
  Administered 2013-02-20 – 2013-02-21 (×4): 40 mg via ORAL
  Filled 2013-02-20 (×4): qty 1

## 2013-02-20 MED ORDER — MIDAZOLAM HCL 10 MG/2ML IJ SOLN
INTRAMUSCULAR | Status: DC | PRN
  Administered 2013-02-20: 2 mg via INTRAVENOUS

## 2013-02-20 MED ORDER — SODIUM CHLORIDE 0.9 % IV SOLN: INTRAVENOUS | Status: DC

## 2013-02-20 NOTE — Progress Notes (Signed)
Subjective: Over night no acute events, no dark stools, no Stoll passed overnight.NPO over night and morning she had EGD, she c/o mild throat pain and no abdominal pain.  Objective:  Vitals:stable BP:113/67,pulse:16, temp:98,spo2:100 Total intake:1785 ml/kg and passed urine 2 times. PE: General: patient sleeping and in no acute distress. HEENT: Head normo cephalic, a traumatic. Eyes: no pallor, no icterus, pupils equal and reacting to light. Mouth and pharynx:moist mucous membranes,no oral ulcers noted, no erythema. Neck:supple and no lymphadenopathy. Abdomen:obese, BS+, soft, non tender, no guarding/rigidty noted. Heart: regular rate and rhythm,S1s2 heard, no murmurs noted.     Vital signs in last 24 hours: Filed Vitals:   02/20/13 0910 02/20/13 0915 02/20/13 0917 02/20/13 0920  BP: 174/82 114/72  113/67  Pulse:      Temp:   98 F (36.7 C)   TempSrc:   Oral   Resp: 24 17  16   Height:      Weight:      SpO2: 100% 100%  100%   Weight change:   Intake/Output Summary (Last 24 hours) at 02/20/13 1206 Last data filed at 02/20/13 0830  Gross per 24 hour  Intake 2025.83 ml  Output      0 ml  Net 2025.83 ml   BP 113/67  Pulse 74  Temp(Src) 98 F (36.7 C) (Oral)  Resp 16  Ht 5\' 4"  (1.626 m)  Wt 144.516 kg (318 lb 9.6 oz)  BMI 54.66 kg/m2  SpO2 100%  General Appearance:    Alert, cooperative, no distress, appears stated age  Head:    Normocephalic, without obvious abnormality, atraumatic  Eyes:    PERRL, conjunctiva/corneas clear, EOM's intact, fundi    benign, both eyes  Ears:    Normal TM's and external ear canals, both ears  Nose:   Nares normal, septum midline, mucosa normal, no drainage    or sinus tenderness  Throat:   Lips, mucosa, and tongue normal; teeth and gums normal  Neck:   Supple, symmetrical, trachea midline, no adenopathy;    thyroid:  no enlargement/tenderness/nodules; no carotid   bruit or JVD  Back:     Symmetric, no curvature, ROM normal, no CVA  tenderness  Lungs:     Clear to auscultation bilaterally, respirations unlabored  Chest Wall:    No tenderness or deformity   Heart:    Regular rate and rhythm, S1 and S2 normal, no murmur, rub   or gallop  Breast Exam:    No tenderness, masses, or nipple abnormality  Abdomen:     Soft, non-tender, bowel sounds active all four quadrants,    no masses, no organomegaly  Genitalia:    Normal female without lesion, discharge or tenderness  Rectal:    Normal tone, normal prostate, no masses or tenderness;   guaiac negative stool  Extremities:   Extremities normal, atraumatic, no cyanosis or edema  Pulses:   2+ and symmetric all extremities  Skin:   Skin color, texture, turgor normal, no rashes or lesions  Lymph nodes:   Cervical, supraclavicular, and axillary nodes normal  Neurologic:   CNII-XII intact, normal strength, sensation and reflexes    throughout   Lab Results: @LABTEST2 @ Micro Results: No results found for this or any previous visit (from the past 240 hour(s)). Studies/Results: No results found. Medications: I have reviewed the patient's current medications. Scheduled Meds: . influenza vac split quadrivalent PF  0.5 mL Intramuscular Tomorrow-1000  . pantoprazole  40 mg Oral BID AC   Continuous Infusions: .  sodium chloride 75 mL/hr at 02/20/13 0955  . sodium chloride     PRN Meds:.morphine injection, ondansetron (ZOFRAN) IV, ondansetron Assessment/Plan: Principal Problem:   Melena Active Problems:   OBESITY   HYPERTENSION   GIB (gastrointestinal bleeding)   Unspecified gastritis and gastroduodenitis without mention of hemorrhage   Gastric ulcer Assessment and plan: [name] 62 year old female with past medical H/O HTN and hyperlipidemia came to clinic yesterday(02/19/2013) with 2 episodes of dark stools one on 02/18/2013 evening( which patient daughter described as large dark stool) and associated with syncope and fall on ground for 10 min but patient was not sure  weather she lost conscious/not. And later on next morning pt had one more episode of dark stool and she came to clinic and later admitted for further evaluation of melena. After admission she took IV morphine and Ondansetron 4mg . Problem list: 1. Melena: due to PUD.monitaring CBC every 8 hourly x admission, And HB at time of admission was 11 and( her baseline was 12.2  One year ago) next HB after 8 hours was 9.6 and another 8 hours later its 9.5. Had EGD today showed: Multiple clean based ulcers in distal half of stomach and 7-8 are less that 5mm, located in antrum. Moderate antral gastritis , moderate inflammation throughout the stomach. Largest ulcer at angularis and mucosa edematous and friable but not neoplastic appearance. Mild duodenitis. Biopsies taken from distal stomach. And plan /day is monitor CBC every 8 hourly. Started on oral pantoprazole 40mg  bid until next GI fallow up about 4 weeks. IV fluids NS 0.9% @75  ml/hr. PRN meds morphine and ondansetron. Monitor HB and vitals and discharge tomorrow if patient HB and vitals stable.  Syncope:vitals stable and no dizziness.  Active problems: Hypertension:taking HCTZ 25 mg daily one pill po but holding this meds because of dizziness.  Hyperlipidemia : Parvastatin 40 mg daily one pill my mouth.  Taking aspirin 81 mg by mouth daily.          This is a Psychologist, occupational Note.  The care of the patient was discussed with Dr.Ejiroghene Emokpae and the assessment and plan formulated with their assistance.  Please see their attached note for official documentation of the daily encounter.   LOS: 1 day   Roland Rack, Med Student 02/20/2013, 12:06 PM

## 2013-02-20 NOTE — Progress Notes (Signed)
Subjective: Mild complaints of some throat pain. Had EGD this morning. Feels better. Was drowsy. Lying on bed. Daughter says mothers urine has a bad odour, though patient has no dysuric symptoms. No epiosdes of dark stools overnight.  Objective: Vital signs in last 24 hours: Filed Vitals:   02/20/13 0910 02/20/13 0915 02/20/13 0917 02/20/13 0920  BP: 174/82 114/72  113/67  Pulse:      Temp:   98 F (36.7 C)   TempSrc:   Oral   Resp: 24 17  16   Height:      Weight:      SpO2: 100% 100%  100%   Weight change:   Intake/Output Summary (Last 24 hours) at 02/20/13 1505 Last data filed at 02/20/13 1400  Gross per 24 hour  Intake 2225.83 ml  Output      0 ml  Net 2225.83 ml   Exam, Complete:17964 GENERAL- alert, co-operative, appears as stated age, not in any distress.  HEENT- Atraumatic, normocephalic, PERRL, EOMI, oral mucosa appears moist,No cervical LN enlargement, thyroid does not appear enlarged.  CARDIAC- RRR, no murmurs, rubs or gallops.  RESP- Moving equal volumes of air, and clear to auscultation bilaterally.  ABDOMEN- Soft,obese non tender, no palpable masses or organomegaly, bowel sounds present.  BACK- Normal curvature of the spine, No tenderness along the vertebrae, no CVA tenderness.  NEURO- Cr N 2-12 intact, strenght equal and present in all extremities  EXTREMITIES- pulse 2+, symmetric.  SKIN- Warm, dry, No rash or lesion.  PSYCH- Normal mood and affect, appropriate thought content and  Lab Results: Basic Metabolic Panel:  Recent Labs Lab 02/19/13 1637 02/20/13 0633  NA 136 138  K 3.5 4.1  CL 100 104  CO2 25 25  GLUCOSE 108* 110*  BUN 24* 16  CREATININE 0.89 0.88  CALCIUM 8.9 8.5   CBC:  Recent Labs Lab 02/19/13 2240 02/20/13 0633  WBC 7.9 7.5  HGB 9.6* 9.5*  HCT 29.0* 28.9*  MCV 86.1 86.5  PLT 248 261   Cardiac Enzymes:  Recent Labs Lab 02/19/13 1634  TROPONINI <0.30   Urinalysis:  Recent Labs Lab 02/20/13 1424  COLORURINE  YELLOW  LABSPEC 1.013  PHURINE 5.5  GLUCOSEU NEGATIVE  HGBUR MODERATE*  BILIRUBINUR NEGATIVE  KETONESUR NEGATIVE  PROTEINUR NEGATIVE  UROBILINOGEN 0.2  NITRITE NEGATIVE  LEUKOCYTESUR NEGATIVE   Medications: I have reviewed the patient's current medications. Scheduled Meds: . influenza vac split quadrivalent PF  0.5 mL Intramuscular Tomorrow-1000  . pantoprazole  40 mg Oral BID AC   Continuous Infusions: . sodium chloride 75 mL/hr at 02/20/13 0955  . sodium chloride     PRN Meds:.morphine injection, ondansetron (ZOFRAN) IV, ondansetron Assessment/Plan: Principal Problem:   Melena Active Problems:   OBESITY   HYPERTENSION   GIB (gastrointestinal bleeding)   Unspecified gastritis and gastroduodenitis without mention of hemorrhage   Gastric ulcer  # Gastric Ulcer- Patient had EGD done today and confirmed findings of upper GI bleed due to PUD- multiple clean based ulcers, moderate inflammation of the stomach and duod, biopsies taken. No hx of signif NSAID use or steriod use. CBG showed drop in HB from 9.8 yesterday on admission to 7.5 this morning. - GI consult- Recs appreciated.  - Protonix 40mg  PO BID.  - Care management consult to assist patient in getting medication on discharge, as she has no insurance. - Odansetron 4mg  Q6h.  - Clears Liquid diet, advance as tolerated. - iv morphine- 1mg  Q4h.  - Will get urinalysis,  as pt's daughter says mothers urine has an abnormal odour.  # HTN- Home meds- HCT- 25mg  daily. Bp currently- Sys- 100s, diastolic in the 60s.  - Will hold BP meds for now, also considering episodes of dizziness.   # Obesity- BMI calc at 54.6  - On discharge advice pt about healthier eating habits, exercise and weight loss.   # Code- Full.   # DVT ppx- SCDs.   Dispo: Disposition is deferred at this time, awaiting improvement of current medical problems.  Anticipated discharge in approximately 1 day.   The patient does have a current PCP Linward Headland,  MD) and does need an [facility] hospital follow-up appointment after discharge.  The patient does not know have transportation limitations that hinder transportation to clinic appointments.  .Services Needed at time of discharge: Y = Yes, Blank = No PT:   OT:   RN:   Equipment:   Other:     LOS: 1 day   Kennis Carina, MD 02/20/2013, 3:05 PM

## 2013-02-20 NOTE — Op Note (Signed)
[facility] [address] [place], [address]   ENDOSCOPY PROCEDURE REPORT  PATIENT: Sandra, Mcdowell  MR#: [id number] BIRTHDATE: January 28, 1951 , 62  yrs. old GENDER: Female ENDOSCOPIST: Rachael Fee, MD REFERRED BY:  [facility] PROCEDURE DATE:  02/20/2013 PROCEDURE:  EGD w/ biopsy ASA CLASS:     Class III INDICATIONS:  melena, anemia. MEDICATIONS: Fentanyl 50 mcg IV and Versed 4 mg IV TOPICAL ANESTHETIC: Cetacaine Spray  DESCRIPTION OF PROCEDURE: After the risks benefits and alternatives of the procedure were thoroughly explained, informed consent was obtained.  The PENTAX GASTOROSCOPE [id number] endoscope was introduced through the mouth and advanced to the second portion of the duodenum. Without limitations.  The instrument was slowly withdrawn as the mucosa was fully examined.    There were multiple clean based ulcers in distal half of stomach. 7-8 of these were all less that 5mm, clean based, located in antrum with associated moderate antral gastritis.  There was moderate inflammation throughout the stomach as well.  The largest ulcer was at the angularis, this was 1cm across and cratered but clean based. The adjacent mucosa was clearly edematous and friable but not neoplastic appearing.  Biopsies were taken from distal stomach, gastritis.  There was mild duodenitis.  The examination was otherwise normal.  Retroflexed views revealed no abnormalities. The scope was then withdrawn from the patient and the procedure completed.  COMPLICATIONS: There were no complications. ENDOSCOPIC IMPRESSION: See above  RECOMMENDATIONS: She was not on PPI prior to admission but should stay on PPI twice daily until she is seen in follow up in GI office (Dr.  Leone Payor in about 4 weeks).  From there, he will likely schedule repeat EGD to check for ulcer resolution.  She admitted to only periodic NSAIDs. Should even refrain from that for the next several  months however. If biopsies show H.  pylori, I will start her on appropriate antibiotics.  Would observe another 24 hours, advance diet as tolerated today.    eSigned:  Rachael Fee, MD 02/20/2013 9:22 AM   CC Stan Head, MD

## 2013-02-20 NOTE — H&P (View-Only) (Signed)
[facility] Consult: 3:07 PM 02/19/2013   Referring Provider: Dr Rogelia Boga  Primary Care Physician:  Janalyn Harder, MD Primary Gastroenterologist:  Dr. Stan Head:  2006 screening colonoscopy  Reason for Consultation:  Melenic stool  HPI: [name] is a 62 y.o. female.  Admitted from outpt clinic today. Felt slightly unsettled stomach and previous day's BM was brown but small.  Took epsom salt and had good-sixed brown stool.  Stomach still felt unsettled throughout day.  About 8 15, after returning from church, went to commode, had a BM and was very dizzy, diaphoretic, felt chills.  Had syncopal spell.  When dtr found her she had been down less than 10 minutes and was still dizzy but very alert.  Stool in commode was black.  She stayed home, declined dtr's suggestion to call EMS or be brought to hospital by family.  This AM still dizzy, small black stool.  Went to PMD clinic and had rectal showing FOB+ stool.  Admitted to hospital.    hgb down 1.2 grams, at 11.0, c/w one year ago.  Awaiting chemistries.  Took aleve once 4 days ago, takes daily baby ASA.  Normally uses aleve rarely.   No hx EGD or ulcers.        Past Medical History  Diagnosis Date  . Right knee pain   . Lumbar back pain   . Hypertension   . Hyperlipidemia   . Obesity   . Premenopausal menorrhagia   . Iron deficiency anemia     postmenopausal bleeding followed by Dr. Okey Dupre.  Hx of benign endometrial polyp.   . Endometrial polyp     bemogm 12/06  . Elevated LFTs     Resolved- etiology unclear hep B, Hep C serology neg 2005  . Colonic polyp     Hyperplastic, no adenomatous or malignant changes.   . Acanthosis nigricans   . Hx of abnormal cervical Pap smear     1980 normal in 2006.   Marland Kitchen Allergic rhinitis     Past Surgical History  Procedure Laterality Date  . Therapeutic abortion  1975  . Colonoscopy      Prior to Admission medications   Medication Sig  Start Date End Date Taking? Authorizing Provider  aspirin 81 MG EC tablet Take 81 mg by mouth daily.      Historical Provider, MD  hydrochlorothiazide (HYDRODIURIL) 25 MG tablet Take 1 tablet (25 mg total) by mouth daily. 07/29/12   Linward Headland, MD  Multiple Vitamin (MULTIVITAMIN) tablet Take 1 tablet by mouth daily.      Historical Provider, MD  pravastatin (PRAVACHOL) 40 MG tablet Take 1 tablet (40 mg total) by mouth daily. 02/09/13   Linward Headland, MD    Scheduled Meds: . [START ON 02/20/2013] influenza vac split quadrivalent PF  0.5 mL Intramuscular Tomorrow-1000  . pantoprazole (PROTONIX) IV  40 mg Intravenous Q12H   Infusions: . sodium chloride     PRN Meds: morphine injection, ondansetron (ZOFRAN) IV, ondansetron   Allergies as of 02/19/2013  . (No Known Allergies)    Family History  Problem Relation Age of Onset  . Hypertension Mother   . Alcohol abuse Father   . Hypertension Father   . Heart disease Father   . Hypertension Sister   . Hypertension Sister     History   Social History  . Marital Status: Divorced    Spouse Name: N/A    Number of Children: N/A  . Years of Education: N/A  Occupational History  . paralegal    Social History Main Topics  . Smoking status: Former Smoker -- 10 years    Types: Cigarettes    Quit date: 07/18/1981  . Smokeless tobacco: Not on file  . Alcohol Use: No  . Drug Use: No  . Sexual Activity: No   Other Topics Concern  . 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    REVIEW OF SYSTEMS: Weight increase in last year No nose bleeds No heartburn No dysphagia No cough or dyspnea No ETOH No hx transfusions.  No pedal edema No hx disbetes.    PHYSICAL EXAM: Vital signs in last 24 hours: Temp:  [98.6 F (37 C)-98.7 F (37.1 C)] 98.6 F (37 C) (09/19 1300) Pulse Rate:  [89-116] 89 (09/19 1300) BP: (114-141)/(84-96) 114/84 mmHg (09/19  1300) SpO2:  [96 %-98 %] 96 % (09/19 1300) Weight:  [144.516 kg (318 lb 9.6 oz)-146.24 kg (322 lb 6.4 oz)] 144.516 kg (318 lb 9.6 oz) (09/19 1300)  General: pleasant, obese AAF.  comfortable Head:  No asymmetry or swelling  Eyes:  No icterus or pallor Ears:  Not HOH  Nose:  No discharge no congestion Mouth:  Clear and moist oral mm.  Good teeth Neck:  No mass or JVD.  No TMG Lungs:  Clear.  Good BS bil. Heart: RRR.  No MRG Abdomen:  Soft, not tender.  Obese, not distended.  BS active.   Rectal: did not reppeat   Musc/Skeltl: no joint contractures Extremities:  No pedal edema  Neurologic:  Pleasant.  Oriented x 3. No gross deficits.  Good historian. Skin:  No rash, sores. Tattoos:  none Nodes:  No cervical adenopathy   Psych:  Pleasant, not depressed or agitated.   Intake/Output from previous day:   Intake/Output this shift:    LAB RESULTS:  Recent Labs  02/19/13 1108  WBC 11.8*  HGB 11.0*  HCT 32.5*  PLT 328  MCV    85  BMET Lab Results  Component Value Date   NA 138 07/29/2012   NA 140 08/08/2010   NA 144 06/12/2009   K 3.6 07/29/2012   K 4.0 08/08/2010   K 4.1 06/12/2009   CL 99 07/29/2012   CL 101 08/08/2010   CL 106 06/12/2009   CO2 30 07/29/2012   CO2 28 08/08/2010   CO2 24 06/12/2009   GLUCOSE 93 07/29/2012   GLUCOSE 102* 08/08/2010   GLUCOSE 94 06/12/2009   BUN 9 07/29/2012   BUN 10 08/08/2010   BUN 11 06/12/2009   CREATININE 0.85 07/29/2012   CREATININE 0.94 08/08/2010   CREATININE 0.95 06/12/2009   CALCIUM 9.6 07/29/2012   CALCIUM 8.9 08/08/2010   CALCIUM 9.2 06/12/2009   LFT No results found for this basename: PROT, ALBUMIN, AST, ALT, ALKPHOS, BILITOT, BILIDIR, IBILI,  in the last 72 hours PT/INR No results found for this basename: INR,  PROTIME    RADIOLOGY STUDIES: No results found.  ENDOSCOPIC STUDIES: 05/2005  Colonoscopy  Dr Leone Payor Unable to find report. Scanned path report with 2 sigmoid hyperplastic colon polyps.    IMPRESSION: *  Nausea, melenic  stool. Rule out PUD.  *  Syncope.  *  Normocytic anemia.  Hgb was 12.2 one year ago, now 11.0 *  Hx hyperplastic colon polyps.   PLAN: *  Added Protonix 40 mg IV q 24 hours *  EGD 9 AM tomorrow.  Clears if  Tolerated tonight.    LOS: 0 days   [name]  02/19/2013, 3:07 PM Pager: [phone]     ________________________________________________________________________  [facility] MD note:  I personally examined the patient, reviewed the data and agree with the assessment and plan described above.  Melenic stool last night and also today once.  Hb a bit lower than previously.  Takes very few NSAIDs.  Will plan on EGD tomorrow (PUD, gastritis?)   Rob Bunting, MD [facility] Pager [phone]

## 2013-02-20 NOTE — H&P (Signed)
  Date: 02/20/2013  Patient name: [name]  Medical record number: [id number]  Date of birth: 02/25/1951   I have seen and evaluated Noreene Larsson and discussed their care with the Residency Team.  Ms Koren was admitted for melena. She has no known GI disease and uses NSAID's only intermittently. She is not on a PPI at home. She was heme + in the [facility] and her HgB was 11.8. She was found to have several clean based ulcers, up to 1 cm. H pylori is pending.   Assessment and Plan: I have seen and evaluated the patient as outlined above. I agree with the formulated Assessment and Plan as detailed in the residents' admission note, with the following changes:   1. UGI bleed 2/2 gastric ulcers - she will need to be D/C'd on PPI which will be an issue financially as she has no insurance and hasn't completed the paper work for the orange card. May need care mgmt support. Her HgB has dropped about 2.5 gm from baseline but is stable. Advance diet slowly. Possible D/C in AM if HgB stable and able to tolerate a diet.   Burns Spain, MD 9/20/201411:58 AM

## 2013-02-20 NOTE — Interval H&P Note (Signed)
History and Physical Interval Note:  02/20/2013 8:45 AM  [name]  has presented today for surgery, with the diagnosis of gi bleed  The various methods of treatment have been discussed with the patient and family. After consideration of risks, benefits and other options for treatment, the patient has consented to  Procedure(s): ESOPHAGOGASTRODUODENOSCOPY (EGD) (N/A) as a surgical intervention .  The patient's history has been reviewed, patient examined, no change in status, stable for surgery.  I have reviewed the patient's chart and labs.  Questions were answered to the patient's satisfaction.     Rachael Fee

## 2013-02-21 DIAGNOSIS — I10 Essential (primary) hypertension: Secondary | ICD-10-CM

## 2013-02-21 LAB — CBC
HCT: 26.2 % — ABNORMAL LOW (ref 36.0–46.0)
HCT: 27.8 % — ABNORMAL LOW (ref 36.0–46.0)
Hemoglobin: 8.6 g/dL — ABNORMAL LOW (ref 12.0–15.0)
Hemoglobin: 9.1 g/dL — ABNORMAL LOW (ref 12.0–15.0)
MCH: 28.5 pg (ref 26.0–34.0)
MCH: 28.5 pg (ref 26.0–34.0)
MCV: 87.1 fL (ref 78.0–100.0)
Platelets: 211 10*3/uL (ref 150–400)
RBC: 3.02 MIL/uL — ABNORMAL LOW (ref 3.87–5.11)
RBC: 3.19 MIL/uL — ABNORMAL LOW (ref 3.87–5.11)
RDW: 15.3 % (ref 11.5–15.5)
WBC: 6.7 10*3/uL (ref 4.0–10.5)

## 2013-02-21 MED ORDER — PANTOPRAZOLE SODIUM 40 MG PO TBEC: 40.0000 mg | DELAYED_RELEASE_TABLET | Freq: Two times a day (BID) | ORAL | Status: DC

## 2013-02-21 NOTE — Progress Notes (Signed)
Subjective: Pt states that she is feeling good this morning. She denies abdominal pain and is tolerating clears. No further dark stools. Hgb decreased this morning. GI cleared for d/c home if tolerating diet at lunch.   Objective: Vital signs in last 24 hours: Filed Vitals:   02/20/13 1813 02/20/13 2126 02/21/13 0522 02/21/13 0805  BP: 121/72 113/76 103/55 110/55  Pulse:  78 71 75  Temp: 98.1 F (36.7 C) 98 F (36.7 C) 98 F (36.7 C) 98.2 F (36.8 C)  TempSrc: Oral Oral Oral Oral  Resp: 18 18 18 18   Height:      Weight:      SpO2: 100% 98% 97% 97%   Weight change:   Intake/Output Summary (Last 24 hours) at 02/21/13 1114 Last data filed at 02/21/13 1023  Gross per 24 hour  Intake   2150 ml  Output    550 ml  Net   1600 ml   Physical Exam Vitals reviewed. General: Morbidly obese female. Resting in bed, NAD HEENT: PERRL, EOMI, no scleral icterus Cardiac: RRR, no rubs, murmurs or gallops Pulm: clear to auscultation bilaterally, no wheezes, rales, or rhonchi Abd: soft, nontender, nondistended Ext: warm and well perfused, no pedal edema Neuro: alert and oriented X3, cranial nerves II-XII grossly intact, nonfocal  Lab Results: Basic Metabolic Panel:  Recent Labs Lab 02/19/13 1637 02/20/13 0633  NA 136 138  K 3.5 4.1  CL 100 104  CO2 25 25  GLUCOSE 108* 110*  BUN 24* 16  CREATININE 0.89 0.88  CALCIUM 8.9 8.5   CBC:  Recent Labs Lab 02/20/13 0633 02/21/13 0550  WBC 7.5 6.7  HGB 9.5* 8.6*  HCT 28.9* 26.2*  MCV 86.5 86.8  PLT 261 211   Cardiac Enzymes:  Recent Labs Lab 02/19/13 1634  TROPONINI <0.30   Urinalysis:  Recent Labs Lab 02/20/13 1424  COLORURINE YELLOW  LABSPEC 1.013  PHURINE 5.5  GLUCOSEU NEGATIVE  HGBUR MODERATE*  BILIRUBINUR NEGATIVE  KETONESUR NEGATIVE  PROTEINUR NEGATIVE  UROBILINOGEN 0.2  NITRITE NEGATIVE  LEUKOCYTESUR NEGATIVE   Medications: I have reviewed the patient's current medications. Scheduled Meds: .  pantoprazole  40 mg Oral BID AC   Continuous Infusions: . sodium chloride 75 mL/hr at 02/21/13 0145  . sodium chloride     PRN Meds:.morphine injection, ondansetron (ZOFRAN) IV, ondansetron  Assessment/Plan:  # Gastric Ulcer: Pt presented with melena and anemia. GI was consulted and an EGD was done on 9/20 which showed multiple clean based ulcers, moderate inflammation of the stomach and duod, biopsies taken. No hx of signif NSAID use or steriod use. Hgb from 11 on admission to 8.6 today from 9.5 yesterday. GI advanced diet and if she tolerates lunch, per GI she is ready for discharge. With her hemoglobin drop this morning, would like to recheck a CBC and if her Hgb is >9.0, will d/c home. Otherwise we will keep her to observe and recheck her CBC in the morning.  - CBC @3pm  - F/u w/ GI, appreciate recommendations.  - Protonix 40mg  PO BID.  - Care management consult for medication assistance - Zofran 4mg  Q6h PRN - Diet as tolerated  # HTN: Home meds- HCT- 25mg  daily. BP currently- Sys- 100s, diastolic in the 60s.  - Will hold BP meds for now, also considering episodes of dizziness.   # Obesity: BMI is 54.6  - On discharge advice pt about healthier eating habits, exercise and weight loss.   # DVT PPx: SCDs.  Dispo: Anticipated discharge today or tomorrow  The patient does have a current PCP Linward Headland, MD) and does need an [facility] hospital follow-up appointment after discharge.  The patient does not know have transportation limitations that hinder transportation to clinic appointments.  .Services Needed at time of discharge: Y = Yes, Blank = No PT:   OT:   RN:   Equipment:   Other:     LOS: 2 days   Genelle Gather, MD 02/21/2013, 11:14 AM

## 2013-02-21 NOTE — Care Management Note (Signed)
    Page 1 of 1   02/22/2013     1:59:07 PM   CARE MANAGEMENT NOTE 02/22/2013  Patient:  Pondexter,Annastacia   Account Number:  [id number]  Date Initiated:  02/21/2013  Documentation initiated by:  [facility]  Subjective/Objective Assessment:   adm: blood in stool     Action/Plan:   discharge planning   Anticipated DC Date:  02/23/2013   Anticipated DC Plan:  HOME/SELF CARE      DC Planning Services  CM consult      Choice offered to / List presented to:             Status of service:  Completed, signed off Medicare Important Message given?   (If response is "NO", the following Medicare IM given date fields will be blank) Date Medicare IM given:   Date Additional Medicare IM given:    Discharge Disposition:  HOME/SELF CARE  Per UR Regulation:  Reviewed for med. necessity/level of care/duration of stay  If discussed at Long Length of Stay Meetings, dates discussed:    Comments:  02/21/13 09:00 Cm met with pt in room to discuss needs. Despite facesheet insurance of BCBS, pt insists she has no insurance.  Pt was given a [facility] for follow up care and to arrange for a PCP.  Pt states she "thinks" this is where she has already been.  She states her ex-husband helps her out financially and she states she gets Disability.  Pt states she is already in the process of getting Medicaid.  CM will continue to follow for discharge needs.  Freddy Jaksch, BSN, CM [phone].

## 2013-02-21 NOTE — Discharge Summary (Signed)
Name: [name] MRN: [id number] DOB: 1951-05-05 62 y.o. PCP: Linward Headland, MD  Date of Admission: 02/19/2013  1:22 PM Date of Discharge: 02/21/2013 Attending Physician: Burns Spain, MD  Discharge Diagnosis: Principal Problem:   Melena Active Problems:   OBESITY   HYPERTENSION   GIB (gastrointestinal bleeding)   Unspecified gastritis and gastroduodenitis without mention of hemorrhage   Gastric ulcer  Discharge Medications:   Medication List    STOP taking these medications       ibuprofen 200 MG tablet  Commonly known as:  ADVIL,MOTRIN     multivitamin tablet      TAKE these medications       ALIVE WOMENS 50+ Tabs  Take 1 tablet by mouth daily.     aspirin 81 MG EC tablet  Take 81 mg by mouth daily.     hydrochlorothiazide 25 MG tablet  Commonly known as:  HYDRODIURIL  Take 1 tablet (25 mg total) by mouth daily.     pantoprazole 40 MG tablet  Commonly known as:  PROTONIX  Take 1 tablet (40 mg total) by mouth 2 (two) times daily before a meal.     pravastatin 40 MG tablet  Commonly known as:  PRAVACHOL  Take 1 tablet (40 mg total) by mouth daily.     SYSTANE 0.4-0.3 % Soln  Generic drug:  Polyethyl Glycol-Propyl Glycol  Apply 2 drops to eye daily as needed (for dry or itchy eyes).        Disposition and follow-up:   [name] was discharged from [facility] in Good condition.  At the hospital follow up visit please address:  1.  If the melena and blood in her stool has resolved. Has her abdominal pain resolved. Did she call [facility] and schedule an appt with Dr. Leone Payor?  2.  Labs / imaging needed at time of follow-up: Check CBC  3.  Pending labs/ test needing follow-up: Gastric biopsy.  4. Tolerance of BP meds, consider reducing dose if BP is low, with c/o dizziness, as BP meds were held while on admission due to low BP on admission and c/o dizziness.  Follow-up Appointments:     Follow-up Information   Follow  up with Stan Head, MD. Schedule an appointment as soon as possible for a visit in 4 weeks.   Specialty:  Gastroenterology   Contact information:   [address] [place] [address] [phone]       Follow up with Janalyn Harder, MD. Schedule an appointment as soon as possible for a visit in 1 week.   Specialty:  Internal Medicine   Contact information:   [address] [place] [address] [phone]       Discharge Instructions: Discharge Orders   Future Orders Complete By Expires   Call MD for:  extreme fatigue  As directed    Call MD for:  persistant dizziness or light-headedness  As directed    Call MD for:  persistant nausea and vomiting  As directed    Call MD for:  severe uncontrolled pain  As directed    Diet - low sodium heart healthy  As directed    Discharge instructions  As directed    Comments:     Refrain from NSAID (ibuprofen like Advil and naproxen like Aleve) for the next several months. You are to follow up with Dr. Leone Payor in 4 weeks. You will need a repeat EGD around that time; Dr. Leone Payor  will schedule this for you. His office number is attached   Increase activity slowly  As directed       Consultations:  GI  Procedures Performed:  No results found.  Admission  History of Present Illness: 62 y o F with PMH- Hyperlipidemia, HTN, Menorrhagia. Presented to the clinic today with c/o black stools yesterday evening. Patient said she was feeling nauseous with some mild abd discomfort, and suddenly had a warm feeling all over her body, had a bowel movemnet and passed out, for a few mins. Thyere was no tongue biting, urinary or fecal incontinence, and pt said she was fine after the episode.Patient said she did not check the colour of her stool, but her daughter saw it, and reported that pts stool was very dark in colour. Pt notes occasiona l dizziness whne she gets up from the bed. This morning, patinet had a nother bowel movement, very small quantiotyy  which was also dark. No previous episodes of dark stools, no vomiting, no signif use of NSAIDs- took advil- 2 tabs 3times a day about a week ago, has had a colonoscopy in the past- a polyp was seen, thinks she was told to come back in 10years. patinet stilll has some abdominal discomfort.  In clinic FOBT done was Positive, CBC- showed- HB of 11.8, baseline- 12.5.   Review of Systems:  Review of system  No significant finding on ROS.  Physical Exam:  Blood pressure 114/84, pulse 89, temperature 98.6 F (37 C), temperature source Oral, resp. rate 18, height 5\' 4"  (1.626 m), weight 318 lb 9.6 oz (144.516 kg), SpO2 96.00%.  .  GENERAL- alert, co-operative, appears as stated age, not in any distress.  HEENT- Atraumatic, normocephalic, PERRL, EOMI, oral mucosa appears moist, good and intact dentition. No cervical LN enlargement, thyroid does not appear enlarged.  CARDIAC- RRR, no murmurs, rubs or gallops.  RESP- Moving equal volumes of air, and clear to auscultation bilaterally.  ABDOMEN- Soft,obese non tender, no palpable masses or organomegaly, bowel sounds present.  BACK- Normal curvature of the spine, No tenderness along the vertebrae, no CVA tenderness.  NEURO- Cr N 2-12 intact, strenght equal and present in all extremities  EXTREMITIES- pulse 2+, symmetric.  SKIN- Warm, dry, No rash or lesion.  PSYCH- Normal mood and affect, appropriate thought content and speech.  Hospital Course by problem list:   # Gastric Ulcer- Pt presented with c/o dark stools, with drop in Hgb from baseline of ~12 to 8.6 during admission. GI was consulted, Patient had EGD done 02/21/2103, and confirmed findings of upper GI bleed due to PUD- multiple clean based ulcers, moderate inflammation of the stomach and duod, biopsies taken, results pending and will determine need for treatment of H.pylori .No hx of signif NSAID use or steriod use. Also on admission, pt was placed on IV protonix 40mg  BID, and after the EGD, diet  was advanced as tolerated. No episodes of dark stools while on admission. Patient was discharged home on oral protonix 40mg  BID. Was strongly advised to avoid NSAIDS, but could continue aspirin 81mg  dly. Care management was consulted to assist patient in getting medication on discharge, as she has no insurance.   # HTN- Home meds- HCT- 25mg  daily. Bp on admission was on the low side, and with c/o dizziness, BP meds were held on admission. Was continued while on admission.  # Obesity- BMI calc at 54.6  - On discharge advice pt about healthier eating habits, exercise and weight loss.  Discharge Vitals:   BP 112/65  Pulse 81  Temp(Src) 97.2 F (36.2 C) (Oral)  Resp 18  Ht 5\' 4"  (1.626 m)  Wt 318 lb 9.6 oz (144.516 kg)  BMI 54.66 kg/m2  SpO2 97%  Discharge Labs:  Results for orders placed during the hospital encounter of 02/19/13 (from the past 24 hour(s))  CBC     Status: Abnormal   Collection Time    02/21/13  5:50 AM      Result Value Range   WBC 6.7  4.0 - 10.5 K/uL   RBC 3.02 (*) 3.87 - 5.11 MIL/uL   Hemoglobin 8.6 (*) 12.0 - 15.0 g/dL   HCT 16.1 (*) 09.6 - 04.5 %   MCV 86.8  78.0 - 100.0 fL   MCH 28.5  26.0 - 34.0 pg   MCHC 32.8  30.0 - 36.0 g/dL   RDW 40.9  81.1 - 91.4 %   Platelets 211  150 - 400 K/uL  CBC     Status: Abnormal   Collection Time    02/21/13  3:00 PM      Result Value Range   WBC 7.2  4.0 - 10.5 K/uL   RBC 3.19 (*) 3.87 - 5.11 MIL/uL   Hemoglobin 9.1 (*) 12.0 - 15.0 g/dL   HCT 78.2 (*) 95.6 - 21.3 %   MCV 87.1  78.0 - 100.0 fL   MCH 28.5  26.0 - 34.0 pg   MCHC 32.7  30.0 - 36.0 g/dL   RDW 08.6  57.8 - 46.9 %   Platelets 222  150 - 400 K/uL    Signed: Kennis Carina, MD 02/21/2013, 7:07 PM   Time Spent on Discharge: 35 minutes Services Ordered on Discharge: None Equipment Ordered on Discharge: None

## 2013-02-21 NOTE — Progress Notes (Signed)
[name] discharged home per MD order.  Discharge instructions reviewed and discussed with the patient, all questions and concerns answered. Copy of instructions and scripts given to patient.    Medication List    STOP taking these medications       ibuprofen 200 MG tablet  Commonly known as:  ADVIL,MOTRIN     multivitamin tablet      TAKE these medications       ALIVE WOMENS 50+ Tabs  Take 1 tablet by mouth daily.     aspirin 81 MG EC tablet  Take 81 mg by mouth daily.     hydrochlorothiazide 25 MG tablet  Commonly known as:  HYDRODIURIL  Take 1 tablet (25 mg total) by mouth daily.     pantoprazole 40 MG tablet  Commonly known as:  PROTONIX  Take 1 tablet (40 mg total) by mouth 2 (two) times daily before a meal.     pravastatin 40 MG tablet  Commonly known as:  PRAVACHOL  Take 1 tablet (40 mg total) by mouth daily.     SYSTANE 0.4-0.3 % Soln  Generic drug:  Polyethyl Glycol-Propyl Glycol  Apply 2 drops to eye daily as needed (for dry or itchy eyes).        Patients skin is clean, dry and intact, no evidence of skin break down. IV site discontinued and catheter remains intact. Site without signs and symptoms of complications. Dressing and pressure applied.  Patient escorted to car by NT in a wheelchair,  no distress noted upon discharge.  Elisha Ponder 02/21/2013 7:59 PM

## 2013-02-21 NOTE — Progress Notes (Signed)
[facility] Progress Note    Since last GI note: EGD yesterday, see full report in chart.  Tolerating liquids without any n/v or abd pains.  No overt GI bleeding.  No BM in 2-3 days.  Want to advance her diet.  Objective: Vital signs in last 24 hours: Temp:  [98 F (36.7 C)-98.1 F (36.7 C)] 98 F (36.7 C) (09/21 0522) Pulse Rate:  [71-78] 71 (09/21 0522) Resp:  [16-24] 18 (09/21 0522) BP: (103-174)/(55-83) 103/55 mmHg (09/21 0522) SpO2:  [97 %-100 %] 97 % (09/21 0522) Last BM Date: 02/19/13 General: alert and oriented times 3, massively obese Heart: regular rate and rythm Abdomen: soft, non-tender, non-distended, normal bowel sounds   Lab Results:  Recent Labs  02/19/13 2240 02/20/13 0633 02/21/13 0550  WBC 7.9 7.5 6.7  HGB 9.6* 9.5* 8.6*  PLT 248 261 211  MCV 86.1 86.5 86.8    Recent Labs  02/19/13 1637 02/20/13 0633  NA 136 138  K 3.5 4.1  CL 100 104  CO2 25 25  GLUCOSE 108* 110*  BUN 24* 16  CREATININE 0.89 0.88  CALCIUM 8.9 8.5    Medications: Scheduled Meds: . pantoprazole  40 mg Oral BID AC   Continuous Infusions: . sodium chloride 75 mL/hr at 02/21/13 0145  . sodium chloride     PRN Meds:.morphine injection, ondansetron (ZOFRAN) IV, ondansetron    Assessment/Plan: 62 y.o. female with gastric ulcers  No sign of continued or recurrent bleeding.  Will advance diet.  OK to go home if she tolerates lunch from GI perspective.  She should stay on PPI twice daily. She needs to call to set up follow up appt with Dr. Leone Payor ([facility]) for about 4-5 weeks from now. At that visit, will discuss repeat EGD to check for ulcer healing.  If gastric biopsies show H. Pylori, I will start her on appropriate antibiotics.  She can continue 25 ASA as needed for cardiovasc disease but otherwise should avoid all NSAIDs.   Rachael Fee, MD  02/21/2013, 8:47 AM [facility] Pager [phone]

## 2013-02-22 ENCOUNTER — Encounter (HOSPITAL_COMMUNITY): Payer: Self-pay | Admitting: Gastroenterology

## 2013-02-22 NOTE — Progress Notes (Signed)
Case discussed with Dr. Niu at the time of the visit.  We reviewed the resident's history and exam and pertinent patient test results.  I agree with the assessment, diagnosis, and plan of care documented in the resident's note.    

## 2013-02-24 ENCOUNTER — Encounter: Payer: Self-pay | Admitting: Internal Medicine

## 2013-03-03 ENCOUNTER — Ambulatory Visit (INDEPENDENT_AMBULATORY_CARE_PROVIDER_SITE_OTHER): Payer: BC Managed Care – PPO | Admitting: Internal Medicine

## 2013-03-03 ENCOUNTER — Encounter: Payer: Self-pay | Admitting: Internal Medicine

## 2013-03-03 VITALS — BP 139/76 | HR 86 | Temp 97.9°F | Ht 64.0 in | Wt 322.6 lb

## 2013-03-03 DIAGNOSIS — K259 Gastric ulcer, unspecified as acute or chronic, without hemorrhage or perforation: Secondary | ICD-10-CM

## 2013-03-03 DIAGNOSIS — I10 Essential (primary) hypertension: Secondary | ICD-10-CM

## 2013-03-03 DIAGNOSIS — D509 Iron deficiency anemia, unspecified: Secondary | ICD-10-CM

## 2013-03-03 LAB — CBC
HCT: 30.7 % — ABNORMAL LOW (ref 36.0–46.0)
MCHC: 32.6 g/dL (ref 30.0–36.0)
MCV: 86 fL (ref 78.0–100.0)
Platelets: 351 10*3/uL (ref 150–400)
RBC: 3.57 MIL/uL — ABNORMAL LOW (ref 3.87–5.11)
WBC: 7.7 10*3/uL (ref 4.0–10.5)

## 2013-03-03 NOTE — Assessment & Plan Note (Signed)
BP Readings from Last 3 Encounters:  03/03/13 139/76  02/21/13 112/65  02/21/13 112/65    Lab Results  Component Value Date   NA 138 02/20/2013   K 4.1 02/20/2013   CREATININE 0.88 02/20/2013    Assessment: Blood pressure control: controlled Progress toward BP goal:  at goal Comments: Controlled  Plan: Medications:  continue current medications Educational resources provided:   Self management tools provided:   Other plans: Recheck at next visit

## 2013-03-03 NOTE — Assessment & Plan Note (Signed)
The patient presents for hospital follow-up.  Mild persistent lightheadedness, but no BRBPR or melena.  Pt has been avoiding NSAIDs. -reinforced importance of avoiding NSAIDs, alcohol -tylenol is OK for occasional aches and pains -will recheck CBC today to ensure this is uptrending -pt to f/u with Dr. Leone Payor 10/28

## 2013-03-03 NOTE — Progress Notes (Signed)
HPI The patient is a 62 y.o. female with a history of iron deficiency anemia, HTN, presenting for a hospital follow-up.  The patient was hospitalized 9/19-9/21 with melena, found to have an upper GI bleed, with EGD showing gastric ulcers, with biopsies negative for H pylori.  The patient notes no BRB or melena in her stool, and no bleeding from any other sites. She notes no fatigue, though she still notes some mild occasional lightheadedness in the mornings.  The patient notes that since hospital discharge, she has been sticking to a low sodium heart healthy diet.  The patient notes some posterior right shoulder, present since her last hospitalization.  She describes the pain as a sharp, intermittent pain that only occurs when moving her arm in certain directions.  She hasn't taken any pain medications for this.  ROS: General: no fevers, chills, changes in weight, changes in appetite Skin: no rash HEENT: no blurry vision, hearing changes, sore throat Pulm: no dyspnea, coughing, wheezing CV: no chest pain, palpitations, shortness of breath Abd: no abdominal pain, nausea/vomiting, diarrhea/constipation GU: no dysuria, hematuria, polyuria Ext: no arthralgias, myalgias Neuro: no weakness, numbness, or tingling  Filed Vitals:   03/03/13 1028  BP: 139/76  Pulse: 86  Temp: 97.9 F (36.6 C)    PEX General: alert, cooperative, and in no apparent distress HEENT: pupils equal round and reactive to light, vision grossly intact, oropharynx clear and non-erythematous  Neck: supple, no lymphadenopathy Lungs: clear to ascultation bilaterally, normal work of respiration, no wheezes, rales, ronchi Heart: regular rate and rhythm, no murmurs, gallops, or rubs Abdomen: soft, non-tender, non-distended, normal bowel sounds Extremities: no cyanosis, clubbing, or edema Neurologic: alert & oriented X3, cranial nerves II-XII intact, strength grossly intact, sensation intact to light touch  Current  Outpatient Prescriptions on File Prior to Visit  Medication Sig Dispense Refill  . aspirin 81 MG EC tablet Take 81 mg by mouth daily.        . hydrochlorothiazide (HYDRODIURIL) 25 MG tablet Take 1 tablet (25 mg total) by mouth daily.  90 tablet  4  . Multiple Vitamins-Minerals (ALIVE WOMENS 50+) TABS Take 1 tablet by mouth daily.      . pantoprazole (PROTONIX) 40 MG tablet Take 1 tablet (40 mg total) by mouth 2 (two) times daily before a meal.  60 tablet  11  . Polyethyl Glycol-Propyl Glycol (SYSTANE) 0.4-0.3 % SOLN Apply 2 drops to eye daily as needed (for dry or itchy eyes).      . pravastatin (PRAVACHOL) 40 MG tablet Take 1 tablet (40 mg total) by mouth daily.  93 tablet  4   No current facility-administered medications on file prior to visit.    Assessment/Plan

## 2013-03-03 NOTE — Patient Instructions (Addendum)
General Instructions: Remember to keep your appointment with Dr. Leone Payor (Gastroenterology), on 10/28, 10:00 am. -continue to avoid over-the-counter pain medications such as aspirin, ibuprofen, and Aleve.  Tylenol is OK to take.  Please return for a follow-up visit in 6 months   Treatment Goals:  Goals (1 Years of Data) as of 03/03/13   None      Progress Toward Treatment Goals:  Treatment Goal 03/03/2013  Blood pressure at goal    Self Care Goals & Plans:  Self Care Goal 02/19/2013  Manage my medications take my medicines as prescribed; bring my medications to every visit; refill my medications on time; follow the sick day instructions if I am sick  Monitor my health keep track of my weight  Eat healthy foods eat more vegetables; eat fruit for snacks and desserts; eat baked foods instead of fried foods; eat smaller portions; eat foods that are low in salt  Be physically active take a walk every day; find an activity I enjoy       Care Management & Community Referrals:  Referral 03/03/2013  Referrals made for care management support none needed

## 2013-03-04 ENCOUNTER — Encounter: Payer: Self-pay | Admitting: Internal Medicine

## 2013-03-05 NOTE — Progress Notes (Signed)
Case discussed with Dr. Brown at the time of the visit.  We reviewed the resident's history and exam and pertinent patient test results.  I agree with the assessment, diagnosis, and plan of care documented in the resident's note. 

## 2013-03-30 ENCOUNTER — Encounter: Payer: Self-pay | Admitting: Internal Medicine

## 2013-03-30 ENCOUNTER — Other Ambulatory Visit (INDEPENDENT_AMBULATORY_CARE_PROVIDER_SITE_OTHER): Payer: BC Managed Care – PPO

## 2013-03-30 ENCOUNTER — Ambulatory Visit (INDEPENDENT_AMBULATORY_CARE_PROVIDER_SITE_OTHER): Payer: BC Managed Care – PPO | Admitting: Internal Medicine

## 2013-03-30 VITALS — BP 150/80 | HR 80 | Ht 65.5 in | Wt 320.1 lb

## 2013-03-30 DIAGNOSIS — K259 Gastric ulcer, unspecified as acute or chronic, without hemorrhage or perforation: Secondary | ICD-10-CM

## 2013-03-30 DIAGNOSIS — K254 Chronic or unspecified gastric ulcer with hemorrhage: Secondary | ICD-10-CM

## 2013-03-30 DIAGNOSIS — D62 Acute posthemorrhagic anemia: Secondary | ICD-10-CM

## 2013-03-30 DIAGNOSIS — Z1211 Encounter for screening for malignant neoplasm of colon: Secondary | ICD-10-CM

## 2013-03-30 LAB — CBC WITH DIFFERENTIAL/PLATELET
Basophils Absolute: 0 10*3/uL (ref 0.0–0.1)
Eosinophils Absolute: 0.3 10*3/uL (ref 0.0–0.7)
Eosinophils Relative: 3.1 % (ref 0.0–5.0)
HCT: 30.7 % — ABNORMAL LOW (ref 36.0–46.0)
Hemoglobin: 10.4 g/dL — ABNORMAL LOW (ref 12.0–15.0)
Lymphs Abs: 2 10*3/uL (ref 0.7–4.0)
MCHC: 33.7 g/dL (ref 30.0–36.0)
Neutro Abs: 5.6 10*3/uL (ref 1.4–7.7)
RDW: 16.9 % — ABNORMAL HIGH (ref 11.5–14.6)

## 2013-03-30 MED ORDER — NA SULFATE-K SULFATE-MG SULF 17.5-3.13-1.6 GM/177ML PO SOLN: ORAL | Status: DC

## 2013-03-30 NOTE — Assessment & Plan Note (Addendum)
Need to schedule EGD to check for resolution. She can decrease PPI to daily. Will do in December The risks and benefits as well as alternatives of endoscopic procedure(s) have been discussed and reviewed. All questions answered. The patient agrees to proceed.

## 2013-03-30 NOTE — Patient Instructions (Addendum)
You have been scheduled for an endoscopy and colonoscopy at [facility].Marland Kitchen Please follow the written instructions given to you at your visit today. Please use the suprep kit we have provided you with today. If you use inhalers (even only as needed), please bring them with you on the day of your procedure. Your physician has requested that you go to www.startemmi.com and enter the access code given to you at your visit today. This web site gives a general overview about your procedure. However, you should still follow specific instructions given to you by our office regarding your preparation for the procedure.  Your physician has requested that you go to the basement for the following lab work before leaving today: CBC/diff, Ferritn  Take your pantoprazole 40mg  once a day instead of twice a day.  It is the time of year to have a vaccination to prevent the flu (influenza virus).  Please have this done through your primary care provider or you can get this done at local pharmacies or the [facility]. It would be very helpful if you notify your primary care provider when and where you had the vaccination given by messaging them in My Chart, leaving a message or faxing the information.   I appreciate the opportunity to care for you.

## 2013-03-30 NOTE — Progress Notes (Signed)
  Subjective:    Patient ID: Noreene Larsson, female    DOB: 1951/06/01, 62 y.o.   MRN: [id number]  HPI The patient is a very nice African American lady I know from previous routine colonoscopy, she was found to have a gastric ulcer after presenting with melena. Dr. Christella Hartigan found that, biopsies were benign and there was no H. pylori. She had been using some anti-inflammatories or not a lot. She has stopped using that, she remains on a PPI twice a day and feels better though she is having some right flank pain worse when she twists and moves. Medications, allergies, past medical history, past surgical history, family history and social history are reviewed and updated in the EMR.   Review of Systems As per history of present illness    Objective:   Physical Exam General:  NAD, obese Eyes:   anicteric Lungs:  clear Heart:  S1S2 no rubs, murmurs or gallops Abdomen:  Obese, soft and nontender, BS+   Data Reviewed:  02/2013 EGD Hospital notes Labs 2006 colonoscopy with fair to adequate prep so even though she had hyperplastic polyps, repeat was recommended for 05/22/2012.     Assessment & Plan:   1. Gastric ulcer   2. Acute blood loss anemia   3. Special screening for malignant neoplasms, colon    CC: Janalyn Harder, MD

## 2013-04-01 ENCOUNTER — Encounter: Payer: Self-pay | Admitting: Internal Medicine

## 2013-04-01 DIAGNOSIS — Z1211 Encounter for screening for malignant neoplasm of colon: Secondary | ICD-10-CM | POA: Insufficient documentation

## 2013-04-01 DIAGNOSIS — D62 Acute posthemorrhagic anemia: Secondary | ICD-10-CM | POA: Insufficient documentation

## 2013-04-01 NOTE — Assessment & Plan Note (Addendum)
Recheck CBC and check a ferritin. Lab Results  Component Value Date   HGB 10.4* 03/30/2013   Lab Results  Component Value Date   FERRITIN 9.5* 03/30/2013   We'll go ahead and start iron supplementation.

## 2013-04-01 NOTE — Assessment & Plan Note (Addendum)
Given some prep issues I recommended a followup in 7 years, she is beyond that now, will ahead and schedule a screening colonoscopy to be done in December when she has her repeat endoscopy to check for resolution of the gastric ulcer.The risks and benefits as well as alternatives of endoscopic procedure(s) have been discussed and reviewed. All questions answered. The patient agrees to proceed. She'll be done at the hospital due to her BMI.

## 2013-04-01 NOTE — Progress Notes (Signed)
Quick Note:  Iron and hemoglobin are low she needs to start ferrous sulfate 325 mg twice a day, can get that over-the-counter Will discuss more and plan followup of anemia at or around the time of colonoscopy 05/05/2013 ______

## 2013-05-05 ENCOUNTER — Encounter (HOSPITAL_COMMUNITY)
Admission: RE | Disposition: A | Discharge status: Home / Self Care | Payer: Self-pay | Source: Ambulatory Visit | Attending: Internal Medicine

## 2013-05-05 ENCOUNTER — Ambulatory Visit (HOSPITAL_COMMUNITY)
Admission: RE | Admit: 2013-05-05 | Discharge: 2013-05-05 | Disposition: A | Discharge status: Home / Self Care | Payer: BC Managed Care – PPO | Source: Ambulatory Visit | Attending: Internal Medicine | Admitting: Internal Medicine

## 2013-05-05 ENCOUNTER — Encounter (HOSPITAL_COMMUNITY): Payer: Self-pay | Admitting: *Deleted

## 2013-05-05 DIAGNOSIS — K259 Gastric ulcer, unspecified as acute or chronic, without hemorrhage or perforation: Secondary | ICD-10-CM

## 2013-05-05 DIAGNOSIS — E785 Hyperlipidemia, unspecified: Secondary | ICD-10-CM | POA: Insufficient documentation

## 2013-05-05 DIAGNOSIS — Z1211 Encounter for screening for malignant neoplasm of colon: Secondary | ICD-10-CM

## 2013-05-05 DIAGNOSIS — Z7982 Long term (current) use of aspirin: Secondary | ICD-10-CM | POA: Insufficient documentation

## 2013-05-05 DIAGNOSIS — Z79899 Other long term (current) drug therapy: Secondary | ICD-10-CM | POA: Insufficient documentation

## 2013-05-05 DIAGNOSIS — D126 Benign neoplasm of colon, unspecified: Secondary | ICD-10-CM | POA: Diagnosis present

## 2013-05-05 DIAGNOSIS — I10 Essential (primary) hypertension: Secondary | ICD-10-CM | POA: Insufficient documentation

## 2013-05-05 DIAGNOSIS — J45909 Unspecified asthma, uncomplicated: Secondary | ICD-10-CM | POA: Insufficient documentation

## 2013-05-05 HISTORY — PX: COLONOSCOPY: SHX5424

## 2013-05-05 HISTORY — PX: ESOPHAGOGASTRODUODENOSCOPY: SHX5428

## 2013-05-05 SURGERY — EGD (ESOPHAGOGASTRODUODENOSCOPY)
Anesthesia: Moderate Sedation

## 2013-05-05 MED ORDER — MIDAZOLAM HCL 10 MG/2ML IJ SOLN
INTRAMUSCULAR | Status: AC
  Filled 2013-05-05: qty 2

## 2013-05-05 MED ORDER — MIDAZOLAM HCL 10 MG/2ML IJ SOLN
INTRAMUSCULAR | Status: DC | PRN
  Administered 2013-05-05 (×2): 2 mg via INTRAVENOUS
  Administered 2013-05-05: 1 mg via INTRAVENOUS

## 2013-05-05 MED ORDER — SODIUM CHLORIDE 0.9 % IV SOLN
INTRAVENOUS | Status: DC
  Administered 2013-05-05: 500 mL via INTRAVENOUS

## 2013-05-05 MED ORDER — FENTANYL CITRATE 0.05 MG/ML IJ SOLN
INTRAMUSCULAR | Status: AC
  Filled 2013-05-05: qty 2

## 2013-05-05 MED ORDER — FENTANYL CITRATE 0.05 MG/ML IJ SOLN
INTRAMUSCULAR | Status: DC | PRN
  Administered 2013-05-05 (×3): 25 ug via INTRAVENOUS

## 2013-05-05 MED ORDER — BUTAMBEN-TETRACAINE-BENZOCAINE 2-2-14 % EX AERO
INHALATION_SPRAY | CUTANEOUS | Status: DC | PRN
  Administered 2013-05-05: 2 via TOPICAL

## 2013-05-05 NOTE — H&P (Signed)
[facility] History and Physical   Primary Care Physician:  Janalyn Harder, MD   Reason for Procedure:   1) Follow-up gastric ulcer      2) Colorectal cancer screening  Plan:    1) EGD     2) Colonoscopy  The risks and benefits as well as alternatives of endoscopic procedure(s) have been discussed and reviewed. All questions answered. The patient agrees to proceed.   HPI: [name] is a 62 y.o. female with hx of gastric ulcer and GI bleed a few months ago. Ok now. Here for follow-up of ulcer and a screening colonoscopy.   Past Medical History  Diagnosis Date  . Right knee pain   . Lumbar back pain   . Hypertension   . Hyperlipidemia   . Obesity     morbid obesity  . Premenopausal menorrhagia   . Iron deficiency anemia     postmenopausal bleeding followed by Dr. Okey Dupre.  Hx of benign endometrial polyp.   . Endometrial polyp     bemogm 12/06  . Elevated LFTs     Resolved- etiology unclear hep B, Hep C serology neg 2005  . Colonic polyp     Hyperplastic, no adenomatous or malignant changes.   . Acanthosis nigricans   . Hx of abnormal cervical Pap smear     1980 normal in 2006.   Marland Kitchen Allergic rhinitis   . Asthma   . Gastric ulcer 02/20/2013  . Gastric ulcer with hemorrhage 02/20/2013    Past Surgical History  Procedure Laterality Date  . Therapeutic abortion  1975  . Colonoscopy    . Esophagogastroduodenoscopy N/A 02/20/2013    Procedure: ESOPHAGOGASTRODUODENOSCOPY (EGD);  Surgeon: Sandra Fee, MD;  Location: [facility] ENDOSCOPY;  Service: Endoscopy;  Laterality: N/A;    Prior to Admission medications   Medication Sig Start Date End Date Taking? Authorizing Provider  aspirin 81 MG EC tablet Take 81 mg by mouth daily.     Yes Historical Provider, MD  hydrochlorothiazide (HYDRODIURIL) 25 MG tablet Take 1 tablet (25 mg total) by mouth daily. 07/29/12  Yes Linward Headland, MD  Multiple Vitamins-Minerals (ALIVE WOMENS 50+) TABS Take 1 tablet by mouth  daily.   Yes Historical Provider, MD  Na Sulfate-K Sulfate-Mg Sulf (SUPREP BOWEL PREP) SOLN Use as directe 03/30/13  Yes Iva Boop, MD  pantoprazole (PROTONIX) 40 MG tablet Take 40 mg by mouth daily. 02/21/13  Yes Genelle Gather, MD  Polyethyl Glycol-Propyl Glycol (SYSTANE) 0.4-0.3 % SOLN Apply 2 drops to eye daily as needed (for dry or itchy eyes).   Yes Historical Provider, MD  pravastatin (PRAVACHOL) 40 MG tablet Take 1 tablet (40 mg total) by mouth daily. 02/09/13  Yes Linward Headland, MD    Current Facility-Administered Medications  Medication Dose Route Frequency Provider Last Rate Last Dose  . 0.9 %  sodium chloride infusion   Intravenous Continuous Iva Boop, MD 20 mL/hr at 05/05/13 1154 500 mL at 05/05/13 1154    Allergies as of 03/30/2013  . (No Known Allergies)    Family History  Problem Relation Age of Onset  . Hypertension Mother   . Alcohol abuse Father   . Hypertension Father   . Heart disease Father   . Hypertension Sister   . Hypertension Sister   . Gout Father   . Hypertension Mother     History   Social History  . Marital Status: Divorced  Spouse Name: N/A    Number of Children: 2  . Years of Education: N/A   Occupational History  . paralegal   . SECRETARY/RECEPTIONIST    Social History Main Topics  . Smoking status: Former Smoker -- 10 years    Types: Cigarettes    Quit date: 07/18/1981  . Smokeless tobacco: Never Used  . Alcohol Use: No  . Drug Use: No  . Sexual Activity: No   Other Topics Concern  . Not on file   Social History Narrative    The patient is a IT consultant, she is divorced and widowed , 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   Unemployed as of summer 2014, job eliminated   1 son one daughter   No caffeine   Updated as of 03/30/2013    Review of Systems: As per HPI  Physical Exam: Vital signs in last 24 hours: Temp:  [97.8 F (36.6 C)] 97.8 F (36.6 C) (12/03 1143) Resp:  [9] 9 (12/03 1143) BP: (124)/(78) 124/78  mmHg (12/03 1143) SpO2:  [95 %] 95 % (12/03 1143) Weight:  [317 lb (143.79 kg)] 317 lb (143.79 kg) (12/03 1143)   General:   Alert,  Well-developed, well-nourished, pleasant and cooperative in NAD Lungs:  Clear throughout to auscultation.   Heart:  Regular rate and rhythm; no murmurs, clicks, rubs,  or gallops. Abdomen:  Soft, nontender and nondistended. Normal bowel sounds.  obese Neuro/Psych:  Alert and cooperative. Normal mood and affect. A and O x 3   @[name]  Sena Slate, MD, [facility] [phone] (pager) 05/05/2013 1:13 PM@

## 2013-05-05 NOTE — Op Note (Signed)
[facility] [address] [place], [address]   ENDOSCOPY PROCEDURE REPORT  PATIENT: Chenoa, Luddy  MR#: [id number] BIRTHDATE: 1950/10/22 , 62  yrs. old GENDER: Female ENDOSCOPIST: Iva Boop, MD, [facility] PROCEDURE DATE:  05/05/2013 PROCEDURE:  EGD, diagnostic ASA CLASS:     Class III INDICATIONS:  follow-up gastric ulcer. MEDICATIONS: Fentanyl 50 mcg IV and Versed 5 mg IV TOPICAL ANESTHETIC: Cetacaine Spray  DESCRIPTION OF PROCEDURE: After the risks benefits and alternatives of the procedure were thoroughly explained, informed consent was obtained.  The    endoscope was introduced through the mouth and advanced to the second portion of the duodenum. Without limitations.  The instrument was slowly withdrawn as the mucosa was fully examined.      The upper, middle and distal third of the esophagus were carefully inspected and no abnormalities were noted.  The z-line was well seen at the GEJ.  The endoscope was pushed into the fundus which was normal including a retroflexed view.  The antrum, gastric body, first and second part of the duodenum were unremarkable. Retroflexed views revealed no abnormalities.     The scope was then withdrawn from the patient and the procedure completed.  COMPLICATIONS: There were no complications. ENDOSCOPIC IMPRESSION: Normal EGD - gastric ulcer that was H. pylori negative is healed  RECOMMENDATIONS: Continue PPI daily   eSigned:  Iva Boop, MD, [facility] 05/05/2013 2:02 PM   CC:The Patient

## 2013-05-05 NOTE — Op Note (Signed)
[facility] [address] [place], [address]   COLONOSCOPY PROCEDURE REPORT  PATIENT: Sandra, Mcdowell  MR#: [id number] BIRTHDATE: 1951-04-03 , 62  yrs. old GENDER: Female ENDOSCOPIST: Iva Boop, MD, [facility] PROCEDURE DATE:  05/05/2013 PROCEDURE:   Colonoscopy, screening First Screening Colonoscopy - Avg.  risk and is 50 yrs.  old or older - No.  Prior Negative Screening - Now for repeat screening. Inadequate prep  History of Adenoma - Now for follow-up colonoscopy & has been > or = to 3 yrs.  N/A  Polyps Removed Today? Yes. ASA CLASS:   Class III INDICATIONS:average risk screening. MEDICATIONS: There was residual sedation effect present from prior procedure and Fentanyl 25 mcg IV  DESCRIPTION OF PROCEDURE:   After the risks benefits and alternatives of the procedure were thoroughly explained, informed consent was obtained.  A digital rectal exam revealed no abnormalities of the rectum.   The Pentax Adult Colonscope [id number] endoscope was introduced through the anus and advanced to the cecum, which was identified by both the appendix and ileocecal valve. No adverse events experienced.   The quality of the prep was excellent using Suprep  The instrument was then slowly withdrawn as the colon was fully examined.      COLON FINDINGS: A diminutive sessile polyp was found in the transverse colon.  A polypectomy was performed with a cold snare. The resection was complete and the polyp tissue was completely retrieved.   The colon mucosa was otherwise normal.  Retroflexed views revealed no abnormalities. The time to cecum=3 minutes 0 seconds.  Withdrawal time=9 minutes 0 seconds.  The scope was withdrawn and the procedure completed. COMPLICATIONS: There were no complications.  ENDOSCOPIC IMPRESSION: 1.   Diminutive sessile polyp was found in the transverse colon; polypectomy was performed with a cold snare 2.   The colon mucosa was otherwise normal - excellent  prep  RECOMMENDATIONS: Timing of repeat colonoscopy will be determined by pathology findings.   eSigned:  Iva Boop, MD, [facility] 05/05/2013 2:05 PM   cc: The Patient

## 2013-05-06 ENCOUNTER — Encounter (HOSPITAL_COMMUNITY): Payer: Self-pay | Admitting: Internal Medicine

## 2013-05-07 ENCOUNTER — Encounter: Payer: Self-pay | Admitting: Internal Medicine

## 2013-05-07 DIAGNOSIS — Z860100 Personal history of colon polyps, unspecified: Secondary | ICD-10-CM | POA: Insufficient documentation

## 2013-05-07 HISTORY — DX: Personal history of colonic polyps: Z86.010

## 2013-05-07 HISTORY — DX: Personal history of colon polyps, unspecified: Z86.0100

## 2013-05-07 NOTE — Progress Notes (Signed)
Quick Note:  Diminutive serrated adenoma Repeat colon 2019 ______

## 2013-05-18 ENCOUNTER — Ambulatory Visit: Payer: BC Managed Care – PPO | Admitting: Internal Medicine

## 2013-09-01 ENCOUNTER — Ambulatory Visit (INDEPENDENT_AMBULATORY_CARE_PROVIDER_SITE_OTHER): Payer: BC Managed Care – PPO | Admitting: Internal Medicine

## 2013-09-01 ENCOUNTER — Encounter: Payer: Self-pay | Admitting: Internal Medicine

## 2013-09-01 VITALS — BP 136/85 | HR 81 | Temp 98.3°F | Ht 65.5 in | Wt 323.1 lb

## 2013-09-01 DIAGNOSIS — D509 Iron deficiency anemia, unspecified: Secondary | ICD-10-CM

## 2013-09-01 DIAGNOSIS — I10 Essential (primary) hypertension: Secondary | ICD-10-CM

## 2013-09-01 DIAGNOSIS — D1779 Benign lipomatous neoplasm of other sites: Secondary | ICD-10-CM

## 2013-09-01 DIAGNOSIS — Z0000 Encounter for general adult medical examination without abnormal findings: Secondary | ICD-10-CM

## 2013-09-01 DIAGNOSIS — D179 Benign lipomatous neoplasm, unspecified: Secondary | ICD-10-CM | POA: Insufficient documentation

## 2013-09-01 MED ORDER — PRAVASTATIN SODIUM 40 MG PO TABS: 40.0000 mg | ORAL_TABLET | Freq: Every day | ORAL | Status: DC

## 2013-09-01 MED ORDER — HYDROCHLOROTHIAZIDE 25 MG PO TABS: 25.0000 mg | ORAL_TABLET | Freq: Every day | ORAL | Status: DC

## 2013-09-01 NOTE — Assessment & Plan Note (Signed)
The lesion on the patient's left lower leg likely represents a lipoma.  Pt cautioned to watch for signs such as erythema, drainage, pruritus, or change in size.  Measured at 5 x 5 mm today.  Can continue to monitor.

## 2013-09-01 NOTE — Assessment & Plan Note (Signed)
BP Readings from Last 3 Encounters:  09/01/13 136/85  05/05/13 127/78  05/05/13 127/78    Lab Results  Component Value Date   NA 138 02/20/2013   K 4.1 02/20/2013   CREATININE 0.88 02/20/2013    Assessment: Blood pressure control: controlled Progress toward BP goal:  at goal Comments: BP well-controlled  Plan: Medications:  Continue HCTZ Educational resources provided: handout Self management tools provided:   Other plans: f/u at next visit

## 2013-09-01 NOTE — Progress Notes (Signed)
HPI The patient is a 63 y.o. female with a history of HTN, HL, iron deficiency anemia, presenting for a routine follow-up.  The patient notes noticing a small nodule on her left ankle.  The patient first noticed this area 3-4 months ago, and the area has not grown since then.  No erythema, drainage, itching, or pain.  The patient has been taking iron supplementation daily for the last 5 months.  She was unable to tolerate BID dosing due to side effects of constipation.  The patient previously had a bleeding ulcer, but f/u EGD 05/2013 showed resolution. She notes no fatigue or LH.  The patient's BP is well-controlled today.  ROS: General: no fevers, chills, changes in weight, changes in appetite Skin: no rash HEENT: no blurry vision, hearing changes, sore throat Pulm: no dyspnea, coughing, wheezing CV: no chest pain, palpitations, shortness of breath Abd: no abdominal pain, nausea/vomiting, diarrhea/constipation GU: no dysuria, hematuria, polyuria Ext: no arthralgias, myalgias Neuro: no weakness, numbness, or tingling  Filed Vitals:   09/01/13 1329  BP: 136/85  Pulse: 81  Temp: 98.3 F (36.8 C)    PEX General: alert, cooperative, and in no apparent distress HEENT: pupils equal round and reactive to light, vision grossly intact, oropharynx clear and non-erythematous  Neck: supple Lungs: clear to ascultation bilaterally, normal work of respiration, no wheezes, rales, ronchi Heart: distant heart sounds, regular rate and rhythm, no murmurs, gallops, or rubs Abdomen: soft, non-tender, non-distended, normal bowel sounds Extremities: no cyanosis, clubbing, or edema.  Left ankle with 5 mm x 5 mm firm nodule, non-tender, with mild overlying hyperpigmentation Neurologic: alert & oriented X3, cranial nerves II-XII intact, strength grossly intact, sensation intact to light touch  Current Outpatient Prescriptions on File Prior to Visit  Medication Sig Dispense Refill  . aspirin 81 MG EC  tablet Take 81 mg by mouth daily.        . hydrochlorothiazide (HYDRODIURIL) 25 MG tablet Take 1 tablet (25 mg total) by mouth daily.  90 tablet  4  . Multiple Vitamins-Minerals (ALIVE WOMENS 50+) TABS Take 1 tablet by mouth daily.      . pantoprazole (PROTONIX) 40 MG tablet Take 40 mg by mouth daily.      Vladimir Faster Glycol-Propyl Glycol (SYSTANE) 0.4-0.3 % SOLN Apply 2 drops to eye daily as needed (for dry or itchy eyes).      . pravastatin (PRAVACHOL) 40 MG tablet Take 1 tablet (40 mg total) by mouth daily.  93 tablet  4   No current facility-administered medications on file prior to visit.    Assessment/Plan

## 2013-09-01 NOTE — Patient Instructions (Addendum)
General Instructions: Your blood pressure is well-controlled today.  We have refilled your Hydrochlorothiazide.  The area on your leg likely represents a Lipoma (see information below).  If this area becomes raised, itchy, painful, or red, please schedule an appointment for re-evaluation.  Please return for a follow-up visit in about 6 months.   Treatment Goals:  Goals (1 Years of Data) as of 09/01/13   None      Progress Toward Treatment Goals:  Treatment Goal 09/01/2013  Blood pressure at goal  Prevent falls at goal    Self Care Goals & Plans:  Self Care Goal 09/01/2013  Manage my medications take my medicines as prescribed; bring my medications to every visit; refill my medications on time  Monitor my health keep track of my weight  Eat healthy foods eat more vegetables; eat baked foods instead of fried foods; eat foods that are low in salt  Be physically active take a walk every day    No flowsheet data found.   Care Management & Community Referrals:  Referral 09/01/2013  Referrals made for care management support none needed      Lipoma A lipoma is a noncancerous (benign) tumor composed of fat cells. They are usually found under the skin (subcutaneous). A lipoma may occur in any tissue of the body that contains fat. Common areas for lipomas to appear include the back, shoulders, buttocks, and thighs. Lipomas are a very common soft tissue growth. They are soft and grow slowly. Most problems caused by a lipoma depend on where it is growing. DIAGNOSIS  A lipoma can be diagnosed with a physical exam. These tumors rarely become cancerous, but radiographic studies can help determine this for certain. Studies used may include:  Computerized X-ray scans (CT or CAT scan).  Computerized magnetic scans (MRI). TREATMENT  Small lipomas that are not causing problems may be watched. If a lipoma continues to enlarge or causes problems, removal is often the best treatment. Lipomas can  also be removed to improve appearance. Surgery is done to remove the fatty cells and the surrounding capsule. Most often, this is done with medicine that numbs the area (local anesthetic). The removed tissue is examined under a microscope to make sure it is not cancerous. Keep all follow-up appointments with your caregiver. SEEK MEDICAL CARE IF:   The lipoma becomes larger or hard.  The lipoma becomes painful, red, or increasingly swollen. These could be signs of infection or a more serious condition. Document Released: 05/10/2002 Document Revised: 08/12/2011 Document Reviewed: 10/20/2009 [facility] Patient Information 2014 [place], [place].

## 2013-09-01 NOTE — Assessment & Plan Note (Signed)
The patient has a history of iron deficiency anemia, with a history of menorrhagia, and a history of a bleeding ulcer (now resolved).  Last Hb was 10.4 (03/2013), with low ferritin.  The patient has been taking daily iron supplementation -continue daily iron -monitor occasional CBC's

## 2013-09-01 NOTE — Assessment & Plan Note (Signed)
-  Pt declines zostavax at this time.  She states she is unsure if she ever had chickenpox.  She wants to check with her mother first.

## 2013-09-02 NOTE — Progress Notes (Signed)
Case discussed with Dr. Brown at the time of the visit.  We reviewed the resident's history and exam and pertinent patient test results.  I agree with the assessment, diagnosis and plan of care documented in the resident's note. 

## 2013-09-08 ENCOUNTER — Other Ambulatory Visit: Payer: Self-pay | Admitting: Ophthalmology

## 2013-11-04 ENCOUNTER — Encounter: Payer: Self-pay | Admitting: *Deleted

## 2013-11-24 ENCOUNTER — Other Ambulatory Visit: Payer: Self-pay | Admitting: Internal Medicine

## 2013-11-24 DIAGNOSIS — E785 Hyperlipidemia, unspecified: Secondary | ICD-10-CM

## 2013-11-25 NOTE — Telephone Encounter (Signed)
Flag sent to front desk pool needs labs.

## 2013-11-25 NOTE — Telephone Encounter (Signed)
I refilled pravastatin.  Patient's last lipid panel was in 2012.  Please have her come in for lab work (lipid panel and CMP).

## 2013-11-30 ENCOUNTER — Other Ambulatory Visit (INDEPENDENT_AMBULATORY_CARE_PROVIDER_SITE_OTHER): Payer: BC Managed Care – PPO

## 2013-11-30 DIAGNOSIS — E785 Hyperlipidemia, unspecified: Secondary | ICD-10-CM

## 2013-11-30 LAB — COMPLETE METABOLIC PANEL WITH GFR
ALT: 40 U/L — AB (ref 0–35)
AST: 36 U/L (ref 0–37)
Albumin: 4.1 g/dL (ref 3.5–5.2)
Alkaline Phosphatase: 89 U/L (ref 39–117)
BUN: 13 mg/dL (ref 6–23)
CO2: 29 meq/L (ref 19–32)
Calcium: 9.4 mg/dL (ref 8.4–10.5)
Chloride: 100 mEq/L (ref 96–112)
Creat: 0.94 mg/dL (ref 0.50–1.10)
GFR, EST AFRICAN AMERICAN: 76 mL/min
GFR, EST NON AFRICAN AMERICAN: 66 mL/min
GLUCOSE: 112 mg/dL — AB (ref 70–99)
Potassium: 3.9 mEq/L (ref 3.5–5.3)
SODIUM: 141 meq/L (ref 135–145)
TOTAL PROTEIN: 6.9 g/dL (ref 6.0–8.3)
Total Bilirubin: 0.6 mg/dL (ref 0.2–1.2)

## 2013-11-30 LAB — LIPID PANEL
Cholesterol: 208 mg/dL — ABNORMAL HIGH (ref 0–200)
HDL: 60 mg/dL (ref >39)
LDL CALC: 126 mg/dL — AB (ref 0–99)
Total CHOL/HDL Ratio: 3.5 Ratio
Triglycerides: 109 mg/dL (ref <150)
VLDL: 22 mg/dL (ref 0–40)

## 2013-12-24 ENCOUNTER — Other Ambulatory Visit: Payer: Self-pay | Admitting: Internal Medicine

## 2014-02-16 ENCOUNTER — Ambulatory Visit (INDEPENDENT_AMBULATORY_CARE_PROVIDER_SITE_OTHER): Payer: BC Managed Care – PPO | Admitting: Internal Medicine

## 2014-02-16 ENCOUNTER — Encounter: Payer: Self-pay | Admitting: Internal Medicine

## 2014-02-16 VITALS — BP 150/77 | HR 66 | Temp 98.2°F | Ht 65.0 in | Wt 320.2 lb

## 2014-02-16 DIAGNOSIS — Z23 Encounter for immunization: Secondary | ICD-10-CM

## 2014-02-16 DIAGNOSIS — I10 Essential (primary) hypertension: Secondary | ICD-10-CM

## 2014-02-16 DIAGNOSIS — Z0000 Encounter for general adult medical examination without abnormal findings: Secondary | ICD-10-CM

## 2014-02-16 DIAGNOSIS — Z1211 Encounter for screening for malignant neoplasm of colon: Secondary | ICD-10-CM | POA: Diagnosis not present

## 2014-02-16 DIAGNOSIS — D62 Acute posthemorrhagic anemia: Secondary | ICD-10-CM

## 2014-02-16 DIAGNOSIS — E785 Hyperlipidemia, unspecified: Secondary | ICD-10-CM

## 2014-02-16 DIAGNOSIS — K259 Gastric ulcer, unspecified as acute or chronic, without hemorrhage or perforation: Secondary | ICD-10-CM

## 2014-02-16 MED ORDER — PANTOPRAZOLE SODIUM 40 MG PO TBEC: 40.0000 mg | DELAYED_RELEASE_TABLET | Freq: Every day | ORAL | Status: DC

## 2014-02-16 NOTE — Progress Notes (Signed)
   Subjective:    Patient ID: [name], female    DOB: 1950/08/17, 63 y.o.   MRN: [id number]  HPI  [name] is a 63 year old woman with HTN, HL, and iron deficiency anemia presenting for routine care. Please see problem-based charting assessment and plan note for further details of medical issues addressed at today's visit.   Review of Systems  Constitutional: Negative for fever, chills and diaphoresis.  Respiratory: Negative for cough and shortness of breath.   Cardiovascular: Negative for chest pain.  Gastrointestinal: Negative for nausea, vomiting, abdominal pain, diarrhea and constipation.  Neurological: Negative for weakness and numbness.       Objective:   Physical Exam  Vitals reviewed. Constitutional: She is oriented to person, place, and time. She appears well-developed and well-nourished. No distress.  obese  HENT:  Head: Normocephalic and atraumatic.  Mouth/Throat: Oropharynx is clear and moist.  Eyes: EOM are normal. Pupils are equal, round, and reactive to light.  Cardiovascular: Normal rate, regular rhythm, normal heart sounds and intact distal pulses.  Exam reveals no gallop and no friction rub.   No murmur heard. Pulmonary/Chest: Effort normal and breath sounds normal. No respiratory distress.  Abdominal: Soft. Bowel sounds are normal. She exhibits no distension. There is no tenderness.  Musculoskeletal: She exhibits no edema.  Neurological: She is alert and oriented to person, place, and time.  Skin: She is not diaphoretic.          Assessment & Plan:

## 2014-02-16 NOTE — Assessment & Plan Note (Signed)
BP Readings from Last 3 Encounters:  02/16/14 150/77  09/01/13 136/85  05/05/13 127/78    Lab Results  Component Value Date   NA 141 11/30/2013   K 3.9 11/30/2013   CREATININE 0.94 11/30/2013    Assessment: Blood pressure control: mildly elevated Progress toward BP goal:  deteriorated Comments: Patient occasionally checks BP at home. She says it is usually systolic in 308M. She thinks she has been more stressed recently as she helps her mother who has cancer. She tries to stay active by mowing the lawn and doing house chores  Plan: Medications:  continue current medications Educational resources provided:   Self management tools provided:   Other plans: Continue HCTZ 25 mg daily. She will check her BP in the morning and bring a log back in 2 months to see if it is chronically elevated. We will then consider adding second agent such as lisinopril. We also discussed going for daily walks as exercise as she'd rather not go on additional medications.

## 2014-02-16 NOTE — Progress Notes (Signed)
INTERNAL MEDICINE TEACHING ATTENDING ADDENDUM - Aldine Contes, MD: I personally saw and evaluated Ms. Silvernail in this clinic visit in conjunction with the resident, Dr. Ethelene Hal. I have discussed patient's plan of care with medical resident during this visit. I have confirmed the physical exam findings and have read and agree with the clinic note including the plan with the following addition: - Pt here for f/u - BP mildly elevated today. Will c/w HCTZ for now - Pt encouraged to do lifestyle modifications including diet and exercise - Will recheck on f/u

## 2014-02-16 NOTE — Assessment & Plan Note (Signed)
Patient LDL 126 in 11/2013 on pravastatin 40 mg daily. We discussed that this is mildly elevated but we treat for risk factors, not a number goal. We discussed lifestyle modification such as walking for exercise.

## 2014-02-16 NOTE — Assessment & Plan Note (Addendum)
Patient given flu shot today. We also gave her a referral for mammogram at [facility]. Pap deferred as first meeting. Patient not interested in Zostavax.

## 2014-02-16 NOTE — Patient Instructions (Signed)
It was a pleasure to meet you today. As we discussed, you should check your blood pressure each morning and record the numbers in a log. Please bring this log with you to your next appointment in about two months. Also try to go to daily walks as we discussed. Please return to clinic or seek medical attention if you have any new or worsening chest pain, shortness of breath, fever, or other worrisome medical condition.

## 2014-04-04 ENCOUNTER — Encounter: Payer: Self-pay | Admitting: Internal Medicine

## 2014-04-20 ENCOUNTER — Ambulatory Visit (INDEPENDENT_AMBULATORY_CARE_PROVIDER_SITE_OTHER): Payer: BC Managed Care – PPO | Admitting: Internal Medicine

## 2014-04-20 ENCOUNTER — Encounter: Payer: Self-pay | Admitting: Internal Medicine

## 2014-04-20 VITALS — BP 149/60 | HR 67 | Temp 98.3°F | Ht 65.0 in | Wt 317.0 lb

## 2014-04-20 DIAGNOSIS — I10 Essential (primary) hypertension: Secondary | ICD-10-CM

## 2014-04-20 DIAGNOSIS — Z0000 Encounter for general adult medical examination without abnormal findings: Secondary | ICD-10-CM

## 2014-04-20 NOTE — Progress Notes (Signed)
Patient ID: [name], female   DOB: [birth year], 63 y.o.   MRN: [id number] Internal Medicine Clinic Attending  I saw and evaluated the patient.  I personally confirmed the key portions of the history and exam documented by Dr. Ethelene Hal and I reviewed pertinent patient test results.  The assessment, diagnosis, and plan were formulated together and I agree with the documentation in the resident's note.

## 2014-04-20 NOTE — Progress Notes (Signed)
   Subjective:    Patient ID: [name], female    DOB: [birth year], 63 y.o.   MRN: [id number]  HPI  [name] is a 63 year old with HTN, HL, iron deficiency anemia here for BP follow-up. Please see problem-based charting assessment and plan note for further details of medical issues addressed at today's visit.  Review of Systems  Constitutional: Negative for fever, chills and diaphoresis.  Respiratory: Negative for cough and shortness of breath.   Cardiovascular: Negative for chest pain.  Gastrointestinal: Negative for nausea, vomiting, diarrhea and constipation.  Neurological: Negative for dizziness, weakness, light-headedness, numbness and headaches.       Objective:   Physical Exam  Constitutional: She is oriented to person, place, and time. She appears well-developed and well-nourished. No distress.  HENT:  Head: Normocephalic and atraumatic.  Mouth/Throat: Oropharynx is clear and moist.  Eyes: EOM are normal. Pupils are equal, round, and reactive to light.  Cardiovascular: Normal rate, regular rhythm, normal heart sounds and intact distal pulses.  Exam reveals no gallop and no friction rub.   No murmur heard. Pulmonary/Chest: Effort normal and breath sounds normal. No respiratory distress. She has no wheezes.  Abdominal: Soft. Bowel sounds are normal. She exhibits no distension. There is no tenderness.  Musculoskeletal: Normal range of motion.  Neurological: She is alert and oriented to person, place, and time.  Skin: She is not diaphoretic.  Vitals reviewed.         Assessment & Plan:

## 2014-04-20 NOTE — Assessment & Plan Note (Addendum)
BP Readings from Last 3 Encounters:  04/20/14 149/60  02/16/14 150/77  09/01/13 136/85    Lab Results  Component Value Date   NA 141 11/30/2013   K 3.9 11/30/2013   CREATININE 0.94 11/30/2013    Assessment: Blood pressure control: mildly elevated Progress toward BP goal:  unchanged Comments: Ms Bither brought her BP log which about half the time was SBP in the 140s. She says she has tried to be active with walking but more difficult now with the weather. We had talked last time about starting a new medication if her BP was still elevated. She is very resistant to starting a new medication.  Plan: Medications:  continue current medications HCTZ 25 mg daily. We have agreed that when she returns if her BP is still borderline elevated after continuing to attempt diet modification and being active, we will convert to HCTZ lisinopril combo pill Educational resources provided: brochure Self management tools provided: home blood pressure logbook Other plans: none

## 2014-04-20 NOTE — Patient Instructions (Signed)
It was a pleasure to see you today. Good job keeping a blood pressure log! We will recheck if you need a new blood pressure medicine next time as well as a pap smear. Please call and scheduled your mammogram.Please return to clinic or seek medical attention if you have any new or worsening chest pain, trouble breathing, or other worrisome medical condition. We look forward to seeing you again in 3 months.  Lottie Mussel, MD  General Instructions:   Please try to bring all your medicines next time. This will help [place] keep you safe from mistakes.   Progress Toward Treatment Goals:  Treatment Goal 04/20/2014  Blood pressure unchanged  Prevent falls -    Self Care Goals & Plans:  Self Care Goal 04/20/2014  Manage my medications take my medicines as prescribed; bring my medications to every visit; refill my medications on time  Monitor my health keep track of my weight  Eat healthy foods eat more vegetables; eat foods that are low in salt; eat baked foods instead of fried foods  Be physically active take a walk every day    No flowsheet data found.   Care Management & Community Referrals:  Referral 09/01/2013  Referrals made for care management support none needed

## 2014-04-20 NOTE — Assessment & Plan Note (Signed)
Ms Doswell still has the card with the information to schedule her mammogram but has not yet done so. I encouraged her to call to schedule it. She says she is not mentally prepared for a pap smear today but has agreed to do it next visit.

## 2014-09-20 ENCOUNTER — Other Ambulatory Visit: Payer: Self-pay | Admitting: *Deleted

## 2014-09-20 MED ORDER — HYDROCHLOROTHIAZIDE 25 MG PO TABS: 25.0000 mg | ORAL_TABLET | Freq: Every day | ORAL | Status: DC

## 2014-09-26 ENCOUNTER — Encounter: Payer: Self-pay | Admitting: *Deleted

## 2014-09-28 ENCOUNTER — Encounter: Payer: Self-pay | Admitting: Internal Medicine

## 2014-09-28 ENCOUNTER — Ambulatory Visit (INDEPENDENT_AMBULATORY_CARE_PROVIDER_SITE_OTHER): Payer: 59 | Admitting: Internal Medicine

## 2014-09-28 VITALS — BP 159/79 | HR 77 | Temp 98.4°F | Ht 65.0 in | Wt 304.8 lb

## 2014-09-28 DIAGNOSIS — Z01419 Encounter for gynecological examination (general) (routine) without abnormal findings: Secondary | ICD-10-CM | POA: Diagnosis not present

## 2014-09-28 DIAGNOSIS — Z6841 Body Mass Index (BMI) 40.0 and over, adult: Secondary | ICD-10-CM | POA: Diagnosis not present

## 2014-09-28 DIAGNOSIS — I10 Essential (primary) hypertension: Secondary | ICD-10-CM | POA: Diagnosis not present

## 2014-09-28 DIAGNOSIS — Z0000 Encounter for general adult medical examination without abnormal findings: Secondary | ICD-10-CM

## 2014-09-28 NOTE — Assessment & Plan Note (Addendum)
[name] agrees to mammogram referral. Paper smear performed  ADDENDUM: Paper smear returned normal without transition zone visualized. I called [name] and gave her results. She had no questions.   Lottie Mussel, MD Internal Medicine, PGY-1 Pager [phone] 10/03/2014, 8:32 AM

## 2014-09-28 NOTE — Assessment & Plan Note (Signed)
BP Readings from Last 3 Encounters:  09/28/14 159/79  04/20/14 149/60  02/16/14 150/77    Lab Results  Component Value Date   NA 141 11/30/2013   K 3.9 11/30/2013   CREATININE 0.94 11/30/2013    Assessment: Blood pressure control: mildly elevated Progress toward BP goal:  unchanged Comments: BP 159/79 and on repeat 156/69. She was anxious as she knew we were going to do a pelvic exam. She brought her log with her which had ~ 40 readings over the past few months. Mostly in the 110s-130s SBP while on HCTZ 25 mg daily  Plan: Medications:  continue current medications Educational resources provided:   Self management tools provided:   Other plans: cont to monitor

## 2014-09-28 NOTE — Progress Notes (Signed)
   Subjective:    Patient ID: [name], female    DOB: Feb 24, 1951, 64 y.o.   MRN: [id number]  HPI  [name] is a 64 year old woman with HTN, HL, and iron deficiency anemia presents for follow-up of her chronic co-morbidities. Please see problem-based charting assessment and plan note for further details of medical issues addressed at today's visit.   Review of Systems  Constitutional: Negative for fever, chills and diaphoresis.  Respiratory: Negative for shortness of breath.   Cardiovascular: Negative for chest pain.  Gastrointestinal: Negative for nausea, vomiting, abdominal pain, diarrhea and constipation.  Neurological: Negative for dizziness, weakness, light-headedness, numbness and headaches.       Objective:   Physical Exam  Constitutional: She appears well-developed and well-nourished. No distress.  HENT:  Head: Normocephalic and atraumatic.  Mouth/Throat: Oropharynx is clear and moist.  Eyes: EOM are normal. Pupils are equal, round, and reactive to light.  Cardiovascular: Normal rate, regular rhythm and intact distal pulses.  Exam reveals no gallop and no friction rub.   No murmur heard. Pulmonary/Chest: Effort normal and breath sounds normal. No respiratory distress. She has no wheezes.  Abdominal: Soft. Bowel sounds are normal. She exhibits no distension. There is no tenderness.  Genitourinary: Vagina normal. No labial fusion. There is no rash, tenderness, lesion or injury on the right labia. There is no rash, tenderness, lesion or injury on the left labia. Cervix exhibits no motion tenderness, no discharge and no friability. No erythema or tenderness in the vagina. No vaginal discharge found.  Skin: She is not diaphoretic.  Vitals reviewed.  Exam chaperoned by RN Lela       Assessment & Plan:

## 2014-09-28 NOTE — Assessment & Plan Note (Signed)
BMI today 51 but she is down 13 lbs since 04/2014. She says she is trying to eat healthy and walk and I encouraged these healthy lifestyle actions.

## 2014-09-28 NOTE — Patient Instructions (Signed)
It was a pleasure to see you today. We did not make and changes to your medicine. We will call you if there are any issues with your labs. Please return to clinic or seek medical attention if you have any new or worsening chest pain, trouble breathing, or other worrisome medical condition. We look forward to seeing you again in 6 months.  Lottie Mussel, MD  General Instructions:   Thank you for bringing your medicines today. This helps [place] keep you safe from mistakes.   Progress Toward Treatment Goals:  Treatment Goal 09/28/2014  Blood pressure unchanged  Prevent falls -    Self Care Goals & Plans:  Self Care Goal 04/20/2014  Manage my medications take my medicines as prescribed; bring my medications to every visit; refill my medications on time  Monitor my health keep track of my weight  Eat healthy foods eat more vegetables; eat foods that are low in salt; eat baked foods instead of fried foods  Be physically active take a walk every day    No flowsheet data found.   Care Management & Community Referrals:  Referral 09/01/2013  Referrals made for care management support none needed

## 2014-09-29 LAB — CYTOLOGY - PAP

## 2014-10-02 NOTE — Progress Notes (Signed)
Case discussed with Dr. Rothman at time of visit. We reviewed the resident's history and exam and pertinent patient test results. I agree with the assessment, diagnosis, and plan of care documented in the resident's note. 

## 2014-12-23 ENCOUNTER — Other Ambulatory Visit: Payer: Self-pay | Admitting: Internal Medicine

## 2014-12-29 ENCOUNTER — Other Ambulatory Visit: Payer: Self-pay | Admitting: Internal Medicine

## 2015-03-14 ENCOUNTER — Ambulatory Visit (INDEPENDENT_AMBULATORY_CARE_PROVIDER_SITE_OTHER): Payer: 59 | Admitting: Internal Medicine

## 2015-03-14 ENCOUNTER — Encounter: Payer: Self-pay | Admitting: Internal Medicine

## 2015-03-14 DIAGNOSIS — D509 Iron deficiency anemia, unspecified: Secondary | ICD-10-CM | POA: Diagnosis not present

## 2015-03-14 DIAGNOSIS — K254 Chronic or unspecified gastric ulcer with hemorrhage: Secondary | ICD-10-CM

## 2015-03-14 DIAGNOSIS — Z23 Encounter for immunization: Secondary | ICD-10-CM | POA: Diagnosis not present

## 2015-03-14 DIAGNOSIS — I10 Essential (primary) hypertension: Secondary | ICD-10-CM | POA: Diagnosis not present

## 2015-03-14 DIAGNOSIS — Z6841 Body Mass Index (BMI) 40.0 and over, adult: Secondary | ICD-10-CM | POA: Diagnosis not present

## 2015-03-14 DIAGNOSIS — Z0000 Encounter for general adult medical examination without abnormal findings: Secondary | ICD-10-CM

## 2015-03-14 LAB — POCT GLYCOSYLATED HEMOGLOBIN (HGB A1C): Hemoglobin A1C: 6

## 2015-03-14 LAB — GLUCOSE, CAPILLARY: GLUCOSE-CAPILLARY: 91 mg/dL (ref 65–99)

## 2015-03-14 MED ORDER — HYDROCHLOROTHIAZIDE 25 MG PO TABS: 25.0000 mg | ORAL_TABLET | Freq: Every day | ORAL | Status: DC

## 2015-03-14 MED ORDER — PANTOPRAZOLE SODIUM 40 MG PO TBEC: 40.0000 mg | DELAYED_RELEASE_TABLET | Freq: Every day | ORAL | Status: DC

## 2015-03-14 MED ORDER — PRAVASTATIN SODIUM 40 MG PO TABS: 40.0000 mg | ORAL_TABLET | Freq: Every day | ORAL | Status: DC

## 2015-03-14 NOTE — Patient Instructions (Addendum)
Thank you for coming in today  - I am checking some labs today. I will call you if there are any problems.  - Most likely the Iron deficiency has resolved now that the gastric ulcers have been resolved. I am checking your levels today and we can most likely stop the iron pills. I will call you with the results. - We will get you scheduled for a mammogram  - Follow up with me in 3 months

## 2015-03-14 NOTE — Assessment & Plan Note (Addendum)
Encouraged continued diet and exercise for weight loss. Weight is down 19 lbs in the past year.   CBG is 91 today. A1c 6.0. Will continue to monitor in the future.

## 2015-03-14 NOTE — Assessment & Plan Note (Addendum)
Patient has a history of a gastric ulcer (resolved) as well as menorrhagia (post-menopausal now) that caused iron deficiency anemia. Reports no symptoms of anemia.  H/H today was 12.8/38 with a Ferrtin of 75.  Will stop iron supplements today. Recheck labs at follow up.

## 2015-03-14 NOTE — Assessment & Plan Note (Addendum)
BP Readings from Last 3 Encounters:  03/14/15 141/68  09/28/14 159/79  04/20/14 149/60    Lab Results  Component Value Date   NA 139 03/14/2015   K 3.2* 03/14/2015   CREATININE 0.92 03/14/2015    Assessment: Blood pressure control:  mildly elevated Progress toward BP goal:   improved Comments: Patient is near goal on hctz 25 mg daily. She does check her BP at home but does not have her log with her today. Reports it usually runs in the 496P systolic but can get into the 150s when she is stressed.   Plan: Medications:  continue current medications Educational resources provided:   Self management tools provided:   Other plans: Will check BMET today. K mildly low at 3.2. Will start low dose K supplement as she is on a thiazide likely causing the hypoK. Will recheck at next visit. If BP remains elevated, may consider adding ACE-I to combat the hypoK and better control her HTN.

## 2015-03-14 NOTE — Progress Notes (Signed)
Patient ID: [name], female   DOB: 28-May-1951, 64 y.o.   MRN: [id number]   Subjective:   Patient ID: [name] female   DOB: [birth year] 64 y.o.   MRN: [id number]  HPI: [name] is a 64 y.o. female with a pmh listed below here today for follow up of her htn and medication refills.   For full details of today's visit and the status of her chronic medical problems please refer to assessment and plan.  Past Medical History  Diagnosis Date  . Right knee pain   . Lumbar back pain   . Hypertension   . Hyperlipidemia   . Obesity     morbid obesity  . Premenopausal menorrhagia   . Iron deficiency anemia     postmenopausal bleeding followed by Dr. Kalman Mcdowell.  Hx of benign endometrial polyp.   . Endometrial polyp     bemogm 12/06  . Elevated LFTs     Resolved- etiology unclear hep B, Hep C serology neg 2005  . Colonic polyp     Hyperplastic, no adenomatous or malignant changes.   . Acanthosis nigricans   . Hx of abnormal cervical Pap smear     1980 normal in 2006.   Marland Kitchen Allergic rhinitis   . Asthma   . Gastric ulcer 02/20/2013  . Gastric ulcer with hemorrhage 02/20/2013   Current Outpatient Prescriptions  Medication Sig Dispense Refill  . aspirin 81 MG EC tablet Take 81 mg by mouth daily.      . ferrous sulfate 325 (65 FE) MG tablet Take 325 mg by mouth daily with breakfast.    . hydrochlorothiazide (HYDRODIURIL) 25 MG tablet TAKE 1 TABLET (25 MG TOTAL) BY MOUTH DAILY. 30 tablet 2  . Multiple Vitamins-Minerals (ALIVE WOMENS 50+) TABS Take 1 tablet by mouth daily.    . pantoprazole (PROTONIX) 40 MG tablet Take 1 tablet (40 mg total) by mouth daily. 90 tablet 3  . Polyethyl Glycol-Propyl Glycol (SYSTANE) 0.4-0.3 % SOLN Apply 2 drops to eye daily as needed (for dry or itchy eyes).    . pravastatin (PRAVACHOL) 40 MG tablet TAKE 1 TABLET (40 MG TOTAL) BY MOUTH DAILY. 93 tablet 5   No current facility-administered medications for this visit.   Family History  Problem  Relation Age of Onset  . Hypertension Mother   . Alcohol abuse Father   . Hypertension Father   . Heart disease Father   . Hypertension Sister   . Hypertension Sister   . Gout Father   . Hypertension Mother    Social History   Social History  . Marital Status: Divorced    Spouse Name: N/A  . Number of Children: 2  . Years of Education: N/A   Occupational History  . paralegal   . SECRETARY/RECEPTIONIST    Social History Main Topics  . Smoking status: Former Smoker -- 10 years    Types: Cigarettes    Quit date: 07/18/1981  . Smokeless tobacco: Never Used  . Alcohol Use: No  . Drug Use: No  . Sexual Activity: No   Other Topics Concern  . None   Social History Narrative    The patient is a Radio broadcast assistant, she is divorced and widowed , 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   Unemployed as of summer 2014, job eliminated   1 son one daughter   No caffeine   Updated as of 03/30/2013   Review of Systems: Review of Systems  Constitutional: Negative for fever and chills.  Eyes:  Negative for blurred vision and double vision.  Respiratory: Negative for shortness of breath.   Cardiovascular: Negative for chest pain.  Gastrointestinal: Negative for nausea, vomiting, abdominal pain and diarrhea.  Genitourinary: Negative for dysuria.  Musculoskeletal: Positive for back pain.  Neurological: Negative for dizziness and headaches.   Objective:  Physical Exam: Filed Vitals:   03/14/15 1506  BP: 141/68  Pulse: 75  Temp: 98 F (36.7 C)  TempSrc: Oral  Height: 5\' 5"  (1.651 m)  Weight: 301 lb 12.8 oz (136.896 kg)  SpO2: 100%   PHYSICAL EXAM Constitutional: She appears well-developed and well-nourished. No distress.  HENT:  Head: Normocephalic and atraumatic.  Mouth/Throat: Oropharynx is clear and moist.  Eyes: EOM are normal. Pupils are equal, round, and reactive to light.  Cardiovascular: Normal rate, regular rhythm and intact distal pulses. Exam reveals no gallop and no  friction rub.  No murmur heard. Pulmonary/Chest: Effort normal and breath sounds normal. No respiratory distress. She has no wheezes.  Abdominal: Soft. Bowel sounds are normal. She exhibits no distension. There is no tenderness.  Skin: She is not diaphoretic.  Vitals reviewed.  Assessment & Plan:   Case discussed with Dr. Dareen Mcdowell. Please refer to Problem based carting for further details of today's visit.

## 2015-03-15 LAB — BASIC METABOLIC PANEL
BUN/Creatinine Ratio: 11 (ref 11–26)
BUN: 10 mg/dL (ref 8–27)
CO2: 28 mmol/L (ref 18–29)
CREATININE: 0.92 mg/dL (ref 0.57–1.00)
Calcium: 9.6 mg/dL (ref 8.7–10.3)
Chloride: 94 mmol/L — ABNORMAL LOW (ref 97–108)
GFR calc Af Amer: 77 mL/min/{1.73_m2} (ref >59)
GFR calc non Af Amer: 67 mL/min/{1.73_m2} (ref >59)
GLUCOSE: 102 mg/dL — AB (ref 65–99)
Potassium: 3.2 mmol/L — ABNORMAL LOW (ref 3.5–5.2)
Sodium: 139 mmol/L (ref 134–144)

## 2015-03-15 LAB — CBC
HEMATOCRIT: 38.1 % (ref 34.0–46.6)
Hemoglobin: 12.8 g/dL (ref 11.1–15.9)
MCH: 28.8 pg (ref 26.6–33.0)
MCHC: 33.6 g/dL (ref 31.5–35.7)
MCV: 86 fL (ref 79–97)
PLATELETS: 314 10*3/uL (ref 150–379)
RBC: 4.44 x10E6/uL (ref 3.77–5.28)
RDW: 14.6 % (ref 12.3–15.4)
WBC: 6.8 10*3/uL (ref 3.4–10.8)

## 2015-03-15 LAB — FERRITIN: Ferritin: 75 ng/mL (ref 15–150)

## 2015-03-21 MED ORDER — POTASSIUM CHLORIDE ER 10 MEQ PO TBCR: 10.0000 meq | EXTENDED_RELEASE_TABLET | Freq: Every day | ORAL | Status: DC

## 2015-03-21 NOTE — Assessment & Plan Note (Signed)
Patient received flu shot today. Never had her mammogram done, will refer again today.

## 2015-03-21 NOTE — Assessment & Plan Note (Signed)
Patient was treated for gastric ulcers in 2014. Still on PPI daily. Reports no symptoms of GERD, denies any abdominal pain.   Will continue her PPI

## 2015-03-22 ENCOUNTER — Other Ambulatory Visit: Payer: Self-pay | Admitting: Internal Medicine

## 2015-03-22 DIAGNOSIS — I10 Essential (primary) hypertension: Secondary | ICD-10-CM

## 2015-03-22 MED ORDER — POTASSIUM CHLORIDE ER 10 MEQ PO TBCR: 10.0000 meq | EXTENDED_RELEASE_TABLET | Freq: Every day | ORAL | Status: DC

## 2015-03-22 NOTE — Progress Notes (Signed)
Internal Medicine Clinic Attending  I saw and evaluated the patient.  I personally confirmed the key portions of the history and exam documented by Dr. Boswell and I reviewed pertinent patient test results.  The assessment, diagnosis, and plan were formulated together and I agree with the documentation in the resident's note. 

## 2015-03-22 NOTE — Telephone Encounter (Signed)
Pt requesting potassium to be filled @ [facility] on [address].

## 2015-04-18 ENCOUNTER — Encounter: Payer: Self-pay | Admitting: Student

## 2015-05-10 ENCOUNTER — Other Ambulatory Visit: Payer: Self-pay | Admitting: Internal Medicine

## 2015-05-16 ENCOUNTER — Encounter: Payer: Self-pay | Admitting: Internal Medicine

## 2015-05-17 ENCOUNTER — Other Ambulatory Visit: Payer: Self-pay | Admitting: *Deleted

## 2015-05-17 DIAGNOSIS — K254 Chronic or unspecified gastric ulcer with hemorrhage: Secondary | ICD-10-CM

## 2015-05-17 MED ORDER — PANTOPRAZOLE SODIUM 40 MG PO TBEC: 40.0000 mg | DELAYED_RELEASE_TABLET | Freq: Every day | ORAL | Status: DC

## 2015-06-20 ENCOUNTER — Other Ambulatory Visit: Payer: Self-pay | Admitting: *Deleted

## 2015-06-20 NOTE — Telephone Encounter (Signed)
Complete, system error from surescripts, pharm, h-t confirmed protonix script

## 2015-08-12 ENCOUNTER — Other Ambulatory Visit: Payer: Self-pay | Admitting: Internal Medicine

## 2015-08-30 ENCOUNTER — Other Ambulatory Visit: Payer: Self-pay | Admitting: Internal Medicine

## 2015-08-30 DIAGNOSIS — Z1231 Encounter for screening mammogram for malignant neoplasm of breast: Secondary | ICD-10-CM

## 2015-09-19 ENCOUNTER — Ambulatory Visit
Admission: RE | Admit: 2015-09-19 | Discharge: 2015-09-19 | Disposition: A | Discharge status: Home / Self Care | Payer: BLUE CROSS/BLUE SHIELD | Source: Ambulatory Visit | Attending: Internal Medicine | Admitting: Internal Medicine

## 2015-09-19 DIAGNOSIS — Z1231 Encounter for screening mammogram for malignant neoplasm of breast: Secondary | ICD-10-CM

## 2015-11-16 ENCOUNTER — Other Ambulatory Visit: Payer: Self-pay

## 2015-11-16 DIAGNOSIS — K254 Chronic or unspecified gastric ulcer with hemorrhage: Secondary | ICD-10-CM

## 2015-11-16 MED ORDER — HYDROCHLOROTHIAZIDE 25 MG PO TABS: 25.0000 mg | ORAL_TABLET | Freq: Every day | ORAL | Status: DC

## 2015-11-16 MED ORDER — PANTOPRAZOLE SODIUM 40 MG PO TBEC: 40.0000 mg | DELAYED_RELEASE_TABLET | Freq: Every day | ORAL | Status: DC

## 2015-11-16 NOTE — Telephone Encounter (Signed)
Pt requesting hydrochlorothiazide and pantoprazole to be filled @ [facility] on [address].

## 2015-11-21 ENCOUNTER — Encounter: Payer: Self-pay | Admitting: Internal Medicine

## 2015-11-21 ENCOUNTER — Ambulatory Visit (INDEPENDENT_AMBULATORY_CARE_PROVIDER_SITE_OTHER): Payer: BLUE CROSS/BLUE SHIELD | Admitting: Internal Medicine

## 2015-11-21 VITALS — BP 143/57 | HR 79 | Temp 98.4°F | Ht 65.0 in | Wt 292.4 lb

## 2015-11-21 DIAGNOSIS — Z8711 Personal history of peptic ulcer disease: Secondary | ICD-10-CM

## 2015-11-21 DIAGNOSIS — E785 Hyperlipidemia, unspecified: Secondary | ICD-10-CM

## 2015-11-21 DIAGNOSIS — Z6841 Body Mass Index (BMI) 40.0 and over, adult: Secondary | ICD-10-CM

## 2015-11-21 DIAGNOSIS — K257 Chronic gastric ulcer without hemorrhage or perforation: Secondary | ICD-10-CM

## 2015-11-21 DIAGNOSIS — Z8719 Personal history of other diseases of the digestive system: Secondary | ICD-10-CM

## 2015-11-21 DIAGNOSIS — I10 Essential (primary) hypertension: Secondary | ICD-10-CM | POA: Diagnosis not present

## 2015-11-21 DIAGNOSIS — E876 Hypokalemia: Secondary | ICD-10-CM

## 2015-11-21 DIAGNOSIS — K254 Chronic or unspecified gastric ulcer with hemorrhage: Secondary | ICD-10-CM

## 2015-11-21 DIAGNOSIS — Z1159 Encounter for screening for other viral diseases: Secondary | ICD-10-CM

## 2015-11-21 MED ORDER — PRAVASTATIN SODIUM 40 MG PO TABS: 40.0000 mg | ORAL_TABLET | Freq: Every day | ORAL | Status: DC

## 2015-11-21 MED ORDER — PANTOPRAZOLE SODIUM 40 MG PO TBEC: 40.0000 mg | DELAYED_RELEASE_TABLET | Freq: Every day | ORAL | Status: DC

## 2015-11-21 NOTE — Patient Instructions (Signed)
General Instructions:  Keep up the good work with the weight loss.  Thank you for bringing your medicines today. This helps [place] keep you safe from mistakes.   Progress Toward Treatment Goals:  Treatment Goal 09/28/2014  Blood pressure unchanged  Prevent falls -    Self Care Goals & Plans:  Self Care Goal 11/21/2015  Manage my medications take my medicines as prescribed; bring my medications to every visit; refill my medications on time  Monitor my health keep track of my weight  Eat healthy foods drink diet soda or water instead of juice or soda; eat more vegetables; eat foods that are low in salt; eat baked foods instead of fried foods; eat fruit for snacks and desserts  Be physically active take a walk every day  Meeting treatment goals maintain the current self-care plan    No flowsheet data found.   Care Management & Community Referrals:  Referral 09/01/2013  Referrals made for care management support none needed

## 2015-11-22 LAB — BMP8+ANION GAP
Anion Gap: 17 mmol/L (ref 10.0–18.0)
BUN / CREAT RATIO: 12 (ref 12–28)
BUN: 12 mg/dL (ref 8–27)
CHLORIDE: 99 mmol/L (ref 96–106)
CO2: 27 mmol/L (ref 18–29)
Calcium: 9.2 mg/dL (ref 8.7–10.3)
Creatinine, Ser: 1 mg/dL (ref 0.57–1.00)
GFR calc non Af Amer: 60 mL/min/{1.73_m2} (ref >59)
GFR, EST AFRICAN AMERICAN: 69 mL/min/{1.73_m2} (ref >59)
Glucose: 97 mg/dL (ref 65–99)
Potassium: 3.7 mmol/L (ref 3.5–5.2)
SODIUM: 143 mmol/L (ref 134–144)

## 2015-11-22 LAB — HEPATITIS C ANTIBODY

## 2015-11-22 LAB — MAGNESIUM: Magnesium: 2.1 mg/dL (ref 1.6–2.3)

## 2015-11-23 NOTE — Assessment & Plan Note (Signed)
-  Great progress with weight loss, I encouraged her to keep up the good work and we set a initial weight goal of 270lbs

## 2015-11-23 NOTE — Assessment & Plan Note (Signed)
Stable.  Continue pravastatin 40 mg daily. 

## 2015-11-23 NOTE — Assessment & Plan Note (Addendum)
Essential Hypertension, near goal Continue HCTZ 25mg  daily, if remains elevated may consider HCTZ-triamterine in place of potassium however i am very encouraged by her weight loss will lead to continued improvement in blood pressure Check BMP

## 2015-11-23 NOTE — Progress Notes (Signed)
[facility] Subjective:   Patient ID: [name] female   DOB: November 03, 1950 65 y.o.   MRN: [id number]  HPI: [name] is a 65 y.o. female with a PMH detailed below who presents for 6 month follow up for HTN and obesity. She has continued to work on her diet and has had great success. Dropping 9 lbs since last visit.  However she is down almost 30 lbs in the last 2 years.  She is taking her medications as directed and has no issues.  She wants to continue her weight loss and try to get off as many medications as possible.  Wt Readings from Last 5 Encounters:  11/21/15 292 lb 6.4 oz (132.632 kg)  03/14/15 301 lb 12.8 oz (136.896 kg)  09/28/14 304 lb 12.8 oz (138.256 kg)  04/20/14 317 lb (143.79 kg)  02/16/14 320 lb 3.2 oz (145.242 kg)       Past Medical History  Diagnosis Date  . Right knee pain   . Lumbar back pain   . Hypertension   . Hyperlipidemia   . Obesity     morbid obesity  . Premenopausal menorrhagia   . Iron deficiency anemia     postmenopausal bleeding followed by Dr. Kalman Shan.  Hx of benign endometrial polyp.   . Endometrial polyp     bemogm 12/06  . Elevated LFTs     Resolved- etiology unclear hep B, Hep C serology neg 2005  . Colonic polyp     Hyperplastic, no adenomatous or malignant changes.   . Acanthosis nigricans   . Hx of abnormal cervical Pap smear     1980 normal in 2006.   Marland Kitchen Allergic rhinitis   . Asthma   . Gastric ulcer 02/20/2013  . Gastric ulcer with hemorrhage 02/20/2013   Current Outpatient Prescriptions  Medication Sig Dispense Refill  . aspirin 81 MG EC tablet Take 81 mg by mouth daily.      . hydrochlorothiazide (HYDRODIURIL) 25 MG tablet Take 1 tablet (25 mg total) by mouth daily. 30 tablet 2  . Multiple Vitamins-Minerals (ALIVE WOMENS 50+) TABS Take 1 tablet by mouth daily.    . pantoprazole (PROTONIX) 40 MG tablet Take 1 tablet (40 mg total) by mouth daily. 30 tablet 5  . Polyethyl Glycol-Propyl Glycol  (SYSTANE) 0.4-0.3 % SOLN Apply 2 drops to eye daily as needed (for dry or itchy eyes).    . potassium chloride (K-DUR) 10 MEQ tablet Take 1 tablet (10 mEq total) by mouth daily. 90 tablet 3  . pravastatin (PRAVACHOL) 40 MG tablet Take 1 tablet (40 mg total) by mouth daily. 30 tablet 5   No current facility-administered medications for this visit.   Family History  Problem Relation Age of Onset  . Hypertension Mother   . Alcohol abuse Father   . Hypertension Father   . Heart disease Father   . Hypertension Sister   . Hypertension Sister   . Gout Father   . Hypertension Mother    Social History   Social History  . Marital Status: Divorced    Spouse Name: N/A  . Number of Children: 2  . Years of Education: N/A   Occupational History  . paralegal   . SECRETARY/RECEPTIONIST    Social History Main Topics  . Smoking status: Former Smoker -- 10 years    Types: Cigarettes    Quit date: 07/18/1981  . Smokeless tobacco: Never Used  . Alcohol Use: No  . Drug Use: No  .  Sexual Activity: No   Other Topics Concern  . None   Social History Narrative    The patient is a Radio broadcast assistant, she is divorced and widowed , 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   Unemployed as of summer 2014, job eliminated   1 son one daughter   No caffeine   Updated as of 03/30/2013   Review of Systems: Review of Systems  Constitutional: Negative for fever.  Eyes: Negative for blurred vision.  Cardiovascular: Negative for chest pain.  Neurological: Negative for headaches.     Objective:  Physical Exam: Filed Vitals:   11/21/15 1038  BP: 143/57  Pulse: 79  Temp: 98.4 F (36.9 C)  TempSrc: Oral  Height: 5\' 5"  (1.651 m)  Weight: 292 lb 6.4 oz (132.632 kg)  SpO2: 98%  Physical Exam  Constitutional:  Obese female  Cardiovascular: Normal rate and regular rhythm.   Pulmonary/Chest: Effort normal and breath sounds normal.  Abdominal: Soft. Bowel sounds are normal.  Musculoskeletal: She exhibits no  edema.  Nursing note and vitals reviewed.   Assessment & Plan:  Case discussed with Dr. Eppie Gibson  History of gastric ulcer -Stable, continue protonix -Check magnesium level  Essential hypertension Essential Hypertension, near goal Continue HCTZ 25mg  daily, if remains elevated may consider HCTZ-triamterine in place of potassium however i am very encouraged by her weight loss will lead to continued improvement in blood pressure Check BMP  Body mass index (BMI) of 45.0 to 49.9 in adult [facility]) -Great progress with weight loss, I encouraged her to keep up the good work and we set a initial weight goal of 270lbs  Hyperlipidemia -Stable -Continue pravastatin 40mg  daily    Medications Ordered Meds ordered this encounter  Medications  . pantoprazole (PROTONIX) 40 MG tablet    Sig: Take 1 tablet (40 mg total) by mouth daily.    Dispense:  30 tablet    Refill:  5  . pravastatin (PRAVACHOL) 40 MG tablet    Sig: Take 1 tablet (40 mg total) by mouth daily.    Dispense:  30 tablet    Refill:  5   Other Orders Orders Placed This Encounter  Procedures  . BMP8+Anion Gap  . Magnesium  . Hepatitis C antibody   Follow Up: Return in about 6 months (around 05/22/2016).

## 2015-11-23 NOTE — Assessment & Plan Note (Addendum)
-  Stable, continue protonix -Check magnesium level

## 2015-11-27 NOTE — Addendum Note (Signed)
Addended by: Oval Linsey D on: 11/27/2015 08:17 AM   Modules accepted: Level of Service

## 2015-11-27 NOTE — Progress Notes (Signed)
Case discussed with Dr. Hoffman soon after the resident saw the patient.  We reviewed the resident's history and exam and pertinent patient test results.  I agree with the assessment, diagnosis and plan of care documented in the resident's note. 

## 2016-02-19 ENCOUNTER — Other Ambulatory Visit: Payer: Self-pay | Admitting: Internal Medicine

## 2016-04-22 ENCOUNTER — Other Ambulatory Visit: Payer: Self-pay | Admitting: Internal Medicine

## 2016-04-22 MED ORDER — HYDROCHLOROTHIAZIDE 25 MG PO TABS: ORAL_TABLET | ORAL | 2 refills | Status: DC

## 2016-04-22 NOTE — Telephone Encounter (Signed)
hydrochlorothiazide (HYDRODIURIL) 25 MG tablet [facility]

## 2016-05-08 ENCOUNTER — Ambulatory Visit (INDEPENDENT_AMBULATORY_CARE_PROVIDER_SITE_OTHER): Payer: BLUE CROSS/BLUE SHIELD | Admitting: Internal Medicine

## 2016-05-08 ENCOUNTER — Encounter: Payer: Self-pay | Admitting: Internal Medicine

## 2016-05-08 VITALS — BP 129/50 | HR 62 | Temp 98.0°F | Ht 65.0 in | Wt 295.1 lb

## 2016-05-08 DIAGNOSIS — E785 Hyperlipidemia, unspecified: Secondary | ICD-10-CM | POA: Diagnosis not present

## 2016-05-08 DIAGNOSIS — Z862 Personal history of diseases of the blood and blood-forming organs and certain disorders involving the immune mechanism: Secondary | ICD-10-CM | POA: Diagnosis not present

## 2016-05-08 DIAGNOSIS — M25561 Pain in right knee: Secondary | ICD-10-CM

## 2016-05-08 DIAGNOSIS — Z8719 Personal history of other diseases of the digestive system: Secondary | ICD-10-CM

## 2016-05-08 DIAGNOSIS — Z79899 Other long term (current) drug therapy: Secondary | ICD-10-CM

## 2016-05-08 DIAGNOSIS — Z23 Encounter for immunization: Secondary | ICD-10-CM | POA: Diagnosis not present

## 2016-05-08 DIAGNOSIS — I10 Essential (primary) hypertension: Secondary | ICD-10-CM

## 2016-05-08 DIAGNOSIS — M542 Cervicalgia: Secondary | ICD-10-CM | POA: Diagnosis not present

## 2016-05-08 DIAGNOSIS — E876 Hypokalemia: Secondary | ICD-10-CM

## 2016-05-08 DIAGNOSIS — Z87891 Personal history of nicotine dependence: Secondary | ICD-10-CM

## 2016-05-08 DIAGNOSIS — Z8711 Personal history of peptic ulcer disease: Secondary | ICD-10-CM

## 2016-05-08 DIAGNOSIS — Z0000 Encounter for general adult medical examination without abnormal findings: Secondary | ICD-10-CM

## 2016-05-08 DIAGNOSIS — G8929 Other chronic pain: Secondary | ICD-10-CM

## 2016-05-08 DIAGNOSIS — Z6841 Body Mass Index (BMI) 40.0 and over, adult: Secondary | ICD-10-CM

## 2016-05-08 MED ORDER — DICLOFENAC SODIUM 1 % TD GEL: 2.0000 g | Freq: Four times a day (QID) | TRANSDERMAL | Status: DC

## 2016-05-08 NOTE — Progress Notes (Signed)
CC: HTN  HPI:  [name] is a 65 y.o. female with a past medical history listed below here today for follow up of her HTN.  HTN - BP 154/71 today. Currently on HCTZ 25 mg daily. She does check her blood pressures at home but did not bring her long today. Reports her BP runs [id number] systolic. Has had issues with hypokalemia in the past and is on KDur 10 mEq daily. BMET at 11/2015 visit with K of 3.7.   Hx of Fe Deficiency Anemia - Has a history of gastric ulcers which have resolved and menorrhagia int he past (now postmenopausal). Her anemia was resolved and stopped her Fe supplementation at last visit.   Hx of Gastric Uclers - Was admitted in 2014 for gastric ulcers. Reports she has a lot of deaths in the family at that time and was under a lot of stress. Had syncopal episode then. Says there have been a lot of deaths in her church recently. Does not feel the same as she did back then. No abdominal pain, no N/V. No reflux symptoms. No melena or hematochezia. Concerned that she will have issues again with the stress from the deaths. She is taking ibuprofen for knee pain.   Morbid Obesity - Weight 320 lbs in 02/2014. She has since been working on weight loss. Weight was 292 lbs at last visit and she is 295 lbs today. She set an initial goal weight loss of 270 lbs at her last office visit in 11/2015. Still exercising three times a week. Working on dietary changes.   HLD - Currently on pravastatin 40 mg daily. Last lipid panel checked in 2015. Total Cholesterol 208, LDL 126, HDL 60.  Neck Pain - 1 week ago, started feeling like the left side of her neck was having a muscle spasm. Describes it a sharp pain with movement. Could not move her head to the left due to pain. No problems moving to the right. Lasted for 3 days. Put vapor rub on it with no improvement. Took ibuprofen and used a heating pad with improvement. Never had an problems with this before. No headaches, back pain,  numbness/tingling or weakness. Slept in her bed the night before. Usually sleeps on her left side with her head on a pillow.  Believes she woke up with the pain but is unsure.  Pain is improved now but says it comes back if she moves her head the wrong way.   Right Knee Pain - History of knee pain. Having aching pains. Reports that when the weather changes she has pain in her right knee. This lasts for days before resolving. Exercises three times a week (Zoomba class) without any issues. No pain during, after or the next day from exercising. No swelling. Takes ibuprofen with some relief. No catching or locking. Hears crackles when she moves her knee.  History of bleeding gastric ulcer.   Preventative Health Care - Mammogram done 09/2015 with no concerns. Flu shot today. Will be due for Colonoscopy in 2019.   Past Medical History:  Diagnosis Date  . Acanthosis nigricans   . Allergic rhinitis   . Asthma   . Colonic polyp    Hyperplastic, no adenomatous or malignant changes.   . Elevated LFTs    Resolved- etiology unclear hep B, Hep C serology neg 2005  . Endometrial polyp    bemogm 12/06  . Gastric ulcer 02/20/2013  . Gastric ulcer with hemorrhage 02/20/2013  . Hx of abnormal cervical  Pap smear    1980 normal in 2006.   Marland Kitchen Hyperlipidemia   . Hypertension   . Iron deficiency anemia    postmenopausal bleeding followed by Dr. Kalman Shan.  Hx of benign endometrial polyp.   . Lumbar back pain   . Obesity    morbid obesity  . Premenopausal menorrhagia   . Right knee pain     Review of Systems:   Review of Systems  Eyes: Negative for blurred vision and double vision.  Musculoskeletal: Positive for joint pain and neck pain. Negative for falls.  Neurological: Negative for dizziness and headaches.  Psychiatric/Behavioral: Negative for depression.    Physical Exam:  Vitals:   05/08/16 1459 05/08/16 1601  BP: (!) 154/71 (!) 129/50  Pulse: 71 62  Temp: 98 F (36.7 C)   TempSrc: Oral   SpO2:  100%   Weight: 295 lb 1.6 oz (133.9 kg)   Height: 5\' 5"  (1.651 m)    Constitutional:  Obese female  Cardiovascular: Normal rate and regular rhythm.   Pulmonary/Chest: Effort normal and breath sounds normal.  Abdominal: Soft. Bowel sounds are normal.  Musculoskeletal: She exhibits no edema. Neck with full ROM. Knee with full ROM. Nursing note and vitals reviewed.  Assessment & Plan:   See Encounters Tab for problem based charting.  Patient discussed with Dr. Lynnae January

## 2016-05-08 NOTE — Patient Instructions (Addendum)
[name],  Please stop taking the Ibuprofen or other NSAIDs (Ibuprofen, Advil, Aleve, Naproxen). You can take Tylenol as needed. I am going to check some lab work today. I will call you if there are any problems.   Your neck pain is likely from muscle spasms from sleeping on it in a odd way. There is nothing to worry about.   I would like to see you back in 6 months for follow up.

## 2016-05-08 NOTE — Assessment & Plan Note (Addendum)
.  Currently on pravastatin 40 mg daily. Last lipid panel checked in 2015. Total Cholesterol 208, LDL 126, HDL 60.   Plan  Re-check lipid panel today, Cholesterol 227, LDL 140. Will need to increase to high intensity statin at next visit

## 2016-05-08 NOTE — Assessment & Plan Note (Addendum)
Patient with a history of gastric ulcers which have resolved and menorrhagia int he past (now postmenopausal). Her anemia was resolved and stopped her Fe supplementation at last visit.   Plan  CBC and Ferritin today, both wnl

## 2016-05-08 NOTE — Assessment & Plan Note (Addendum)
.   BP Readings from Last 3 Encounters:  05/08/16 (!) 129/50  11/21/15 (!) 143/57  03/14/15 (!) 141/68    Lab Results  Component Value Date   NA 143 05/08/2016   K 3.5 05/08/2016   CREATININE 0.99 05/08/2016    Assessment: BP 154/71 today. Currently on HCTZ 25 mg daily. She does check her blood pressures at home but did not bring her long today. Reports her BP runs [id number] systolic. Has had issues with hypokalemia in the past and is on KDur 10 mEq daily. BMET at 11/2015 visit with K of 3.7.  BP 126/50 on re-check  Plan: BMET today, unremarkable Continue current medications

## 2016-05-09 LAB — BMP8+ANION GAP
ANION GAP: 21 mmol/L — AB (ref 10.0–18.0)
BUN/Creatinine Ratio: 13 (ref 12–28)
BUN: 13 mg/dL (ref 8–27)
CALCIUM: 9.2 mg/dL (ref 8.7–10.3)
CHLORIDE: 98 mmol/L (ref 96–106)
CO2: 24 mmol/L (ref 18–29)
Creatinine, Ser: 0.99 mg/dL (ref 0.57–1.00)
GFR, EST AFRICAN AMERICAN: 70 mL/min/{1.73_m2} (ref >59)
GFR, EST NON AFRICAN AMERICAN: 61 mL/min/{1.73_m2} (ref >59)
Glucose: 99 mg/dL (ref 65–99)
Potassium: 3.5 mmol/L (ref 3.5–5.2)
Sodium: 143 mmol/L (ref 134–144)

## 2016-05-09 LAB — LIPID PANEL
CHOL/HDL RATIO: 3.5 ratio (ref 0.0–4.4)
Cholesterol, Total: 227 mg/dL — ABNORMAL HIGH (ref 100–199)
HDL: 65 mg/dL (ref >39)
LDL Calculated: 140 mg/dL — ABNORMAL HIGH (ref 0–99)
Triglycerides: 112 mg/dL (ref 0–149)
VLDL CHOLESTEROL CAL: 22 mg/dL (ref 5–40)

## 2016-05-09 LAB — CBC
Hematocrit: 37.3 % (ref 34.0–46.6)
Hemoglobin: 12.8 g/dL (ref 11.1–15.9)
MCH: 28.8 pg (ref 26.6–33.0)
MCHC: 34.3 g/dL (ref 31.5–35.7)
MCV: 84 fL (ref 79–97)
PLATELETS: 295 10*3/uL (ref 150–379)
RBC: 4.45 x10E6/uL (ref 3.77–5.28)
RDW: 14.9 % (ref 12.3–15.4)
WBC: 7.4 10*3/uL (ref 3.4–10.8)

## 2016-05-09 LAB — FERRITIN: Ferritin: 61 ng/mL (ref 15–150)

## 2016-05-10 DIAGNOSIS — M25561 Pain in right knee: Secondary | ICD-10-CM | POA: Insufficient documentation

## 2016-05-10 NOTE — Assessment & Plan Note (Signed)
1 week ago, started feeling like the left side of her neck was having a muscle spasm. Describes it a sharp pain with movement. Could not move her head to the left due to pain. No problems moving to the right. Lasted for 3 days. Put vapor rub on it with no improvement. Took ibuprofen and used a heating pad with improvement. Never had an problems with this before. No headaches, back pain, numbness/tingling or weakness. Slept in her bed the night before. Usually sleeps on her left side with her head on a pillow.  Believes she woke up with the pain but is unsure.  Pain is improved now but says it comes back if she moves her head the wrong way.   Plan Likely a muscle strain. Possibly from sleeping in an unusual position. Continue Head as needed. Voltaren gel prn.

## 2016-05-10 NOTE — Assessment & Plan Note (Signed)
Mammogram done 09/2015 with no concerns. Flu shot today. Will be due for Colonoscopy in 2019.

## 2016-05-10 NOTE — Assessment & Plan Note (Signed)
Weight 320 lbs in 02/2014. She has since been working on weight loss. Weight was 292 lbs at last visit and she is 295 lbs today. She set an initial goal weight loss of 270 lbs at her last office visit in 11/2015. Still exercising three times a week. Working on dietary changes.   Plan Continue to encourage weight loss, diet and exercise

## 2016-05-10 NOTE — Assessment & Plan Note (Signed)
Was admitted in 2014 for gastric ulcers. Reports she has a lot of deaths in the family at that time and was under a lot of stress. Had syncopal episode then. Says there have been a lot of deaths in her church recently. Does not feel the same as she did back then. No abdominal pain, no N/V. No reflux symptoms. No melena or hematochezia. Concerned that she will have issues again with the stress from the deaths. She is taking ibuprofen for knee pain.   Plan CBC today, H/H stable

## 2016-05-10 NOTE — Assessment & Plan Note (Addendum)
history of knee pain. Having aching pains. Reports that when the weather changes she has pain in her right knee. This lasts for days before resolving. Exercises three times a week (Zoomba class) without any issues. No pain during, after or the next day from exercising. No swelling. Takes ibuprofen with some relief. No catching or locking. Hears crackles when she moves her knee.  History of bleeding gastric ulcer.   Plan Likely OA.  Stop ibuprofen. Voltaren gel prn. Consider XR if worsening

## 2016-05-13 NOTE — Progress Notes (Signed)
Internal Medicine Clinic Attending  Case discussed with Dr. Boswell at the time of the visit.  We reviewed the resident's history and exam and pertinent patient test results.  I agree with the assessment, diagnosis, and plan of care documented in the resident's note.  

## 2016-06-12 ENCOUNTER — Other Ambulatory Visit: Payer: Self-pay

## 2016-06-12 MED ORDER — PANTOPRAZOLE SODIUM 40 MG PO TBEC: 40.0000 mg | DELAYED_RELEASE_TABLET | Freq: Every day | ORAL | 3 refills | Status: DC

## 2016-06-12 NOTE — Telephone Encounter (Signed)
pantoprazole (PROTONIX) 40 MG tablet, refill request @ [facility].

## 2016-07-05 ENCOUNTER — Other Ambulatory Visit: Payer: Self-pay

## 2016-07-05 DIAGNOSIS — Z6841 Body Mass Index (BMI) 40.0 and over, adult: Secondary | ICD-10-CM

## 2016-07-05 MED ORDER — PRAVASTATIN SODIUM 40 MG PO TABS: 40.0000 mg | ORAL_TABLET | Freq: Every day | ORAL | 1 refills | Status: DC

## 2016-07-05 NOTE — Telephone Encounter (Signed)
pravastatin (PRAVACHOL) 40 MG tablet, refill request @ [facility] on [address]

## 2016-09-10 ENCOUNTER — Other Ambulatory Visit: Payer: Self-pay | Admitting: Internal Medicine

## 2016-09-10 DIAGNOSIS — Z1231 Encounter for screening mammogram for malignant neoplasm of breast: Secondary | ICD-10-CM

## 2016-09-27 ENCOUNTER — Ambulatory Visit
Admission: RE | Admit: 2016-09-27 | Discharge: 2016-09-27 | Disposition: A | Discharge status: Home / Self Care | Payer: BLUE CROSS/BLUE SHIELD | Source: Ambulatory Visit | Attending: Family Medicine | Admitting: Family Medicine

## 2016-09-27 DIAGNOSIS — Z1231 Encounter for screening mammogram for malignant neoplasm of breast: Secondary | ICD-10-CM

## 2017-01-15 ENCOUNTER — Encounter: Payer: Self-pay | Admitting: Internal Medicine

## 2017-01-15 ENCOUNTER — Ambulatory Visit (INDEPENDENT_AMBULATORY_CARE_PROVIDER_SITE_OTHER): Payer: BLUE CROSS/BLUE SHIELD | Admitting: Internal Medicine

## 2017-01-15 VITALS — BP 110/81 | HR 74 | Temp 97.9°F | Ht 65.0 in | Wt 293.9 lb

## 2017-01-15 DIAGNOSIS — Z87891 Personal history of nicotine dependence: Secondary | ICD-10-CM | POA: Diagnosis not present

## 2017-01-15 DIAGNOSIS — E785 Hyperlipidemia, unspecified: Secondary | ICD-10-CM

## 2017-01-15 DIAGNOSIS — R7303 Prediabetes: Secondary | ICD-10-CM | POA: Diagnosis not present

## 2017-01-15 DIAGNOSIS — M542 Cervicalgia: Secondary | ICD-10-CM

## 2017-01-15 DIAGNOSIS — I10 Essential (primary) hypertension: Secondary | ICD-10-CM | POA: Diagnosis not present

## 2017-01-15 DIAGNOSIS — Z79899 Other long term (current) drug therapy: Secondary | ICD-10-CM

## 2017-01-15 DIAGNOSIS — Z6841 Body Mass Index (BMI) 40.0 and over, adult: Secondary | ICD-10-CM

## 2017-01-15 MED ORDER — PRAVASTATIN SODIUM 40 MG PO TABS: 40.0000 mg | ORAL_TABLET | Freq: Every day | ORAL | 2 refills | Status: DC

## 2017-01-15 MED ORDER — HYDROCHLOROTHIAZIDE 25 MG PO TABS: ORAL_TABLET | ORAL | 2 refills | Status: DC

## 2017-01-15 NOTE — Progress Notes (Signed)
   CC: Neck pain  HPI:  [name] is a 66 y.o. female with a past medical history listed below here today with complaints of left sided neck pain.  For details of today's visit and the status of her chronic medical issues please refer to the assessment and plan.   Past Medical History:  Diagnosis Date  . Acanthosis nigricans   . Allergic rhinitis   . Asthma   . Colonic polyp    Hyperplastic, no adenomatous or malignant changes.   . Elevated LFTs    Resolved- etiology unclear hep B, Hep C serology neg 2005  . Endometrial polyp    bemogm 12/06  . Gastric ulcer 02/20/2013  . Gastric ulcer with hemorrhage 02/20/2013  . Hx of abnormal cervical Pap smear    1980 normal in 2006.   Marland Kitchen Hyperlipidemia   . Hypertension   . Iron deficiency anemia    postmenopausal bleeding followed by Dr. Kalman Shan.  Hx of benign endometrial polyp.   . Lumbar back pain   . Obesity    morbid obesity  . Premenopausal menorrhagia   . Right knee pain    Review of Systems:   No chest pain or shortness of breath  Physical Exam:  Vitals:   01/15/17 1407  BP: 110/81  Pulse: 74  Temp: 97.9 F (36.6 C)  TempSrc: Oral  SpO2: 100%  Weight: 293 lb 14.4 oz (133.3 kg)  Height: 5\' 5"  (1.651 m)   GENERAL- alert, morbidly obese female in no distress CARDIAC- RRR, no murmurs, rubs or gallops. RESP- CTAB ABDOMEN- Soft, nontender, bowel sounds present. MSK: Neck with full range of motion. No muscle tenderness.    Assessment & Plan:   See Encounters Tab for problem based charting.  Patient discussed with Dr. Daryll Drown

## 2017-01-15 NOTE — Patient Instructions (Signed)
Ms. Wiedemann,   Keep working on the diet and exercise. You are doing great. If you change your mind about meeting with Butch Penny for nutrition guidance either one-on-one or in a group setting let us know and we can arrange it for you.   Everything else is looking great. I can see you back in 6 months for follow up.

## 2017-01-16 LAB — LIPID PANEL
CHOL/HDL RATIO: 3.3 ratio (ref 0.0–4.4)
Cholesterol, Total: 206 mg/dL — ABNORMAL HIGH (ref 100–199)
HDL: 63 mg/dL (ref >39)
LDL Calculated: 119 mg/dL — ABNORMAL HIGH (ref 0–99)
Triglycerides: 121 mg/dL (ref 0–149)
VLDL CHOLESTEROL CAL: 24 mg/dL (ref 5–40)

## 2017-01-16 LAB — HEMOGLOBIN A1C
Est. average glucose Bld gHb Est-mCnc: 128 mg/dL
Hgb A1c MFr Bld: 6.1 % — ABNORMAL HIGH (ref 4.8–5.6)

## 2017-01-19 DIAGNOSIS — R7303 Prediabetes: Secondary | ICD-10-CM | POA: Insufficient documentation

## 2017-01-19 NOTE — Assessment & Plan Note (Signed)
BP Readings from Last 3 Encounters:  01/15/17 110/81  05/08/16 (!) 129/50  11/21/15 (!) 143/57    Lab Results  Component Value Date   NA 143 05/08/2016   K 3.5 05/08/2016   CREATININE 0.99 05/08/2016   BP 110/81 today. Currently on HCTZ 25 mg daily only. She denies any lightheadedness/dizziness, vision changes, chest pain, shortness of breath.   Assessment: HTN, well controlled  Plan: Continue current medications

## 2017-01-19 NOTE — Assessment & Plan Note (Signed)
Weight 292 > 295 lbs today. Had lengthy discussion today regarding her weight and weight loss. She does report that she has started walking roughly 15 minutes 3 times a week in the past month. She also has been doing a weekly Zomba class. Does note that her clothes feel looser despite the weight staying stable. She is somewhat discouraged by the weight. Discussed her diet and she does report she has been trying to make some changes but does not appear to have made much progress on that front. Discussed meeting with Butch Penny for further guidance and she declined today.   Plan: Encouraged continued exercise and dietary changes. Tried to set realistic weight loss goals and encouraged considering meeting with Butch Penny in a private or group setting.

## 2017-01-19 NOTE — Assessment & Plan Note (Signed)
Reports that she had another episode similar to her previous symptoms since she last saw me. Reports left sided neck pain after waking up from sleep. Notes it was sharp and worse with movement. She tried ibuprofen and heating pad with some improvement. Symptoms resolved after 3 weeks. Not currently having any symptoms.   Exam unremarkable today.   Plan: Continue conservative therapies prn. Discussed sleeping habits to try to prevent further episodes.

## 2017-01-19 NOTE — Assessment & Plan Note (Signed)
HgbA1c is 6.1 today. Remains in pre-DM range. Continuing to encourage dietary changes, exercise, and weight loss.  Continue to monitor annually.

## 2017-01-19 NOTE — Assessment & Plan Note (Signed)
Lipid panel today with Total Cholesterol 206, HDL 63, LDL 119 (140 previously), TG 121. Currently on pravastatin 40 mg daily.  ASCVD score 5.9%. No need for escalation to high intensity statin at this time. Will continue pravastatin.

## 2017-01-21 NOTE — Progress Notes (Signed)
Internal Medicine Clinic Attending  Case discussed with Dr. Boswell at the time of the visit.  We reviewed the resident's history and exam and pertinent patient test results.  I agree with the assessment, diagnosis, and plan of care documented in the resident's note.  

## 2017-06-05 ENCOUNTER — Telehealth: Payer: Self-pay | Admitting: Internal Medicine

## 2017-06-05 ENCOUNTER — Other Ambulatory Visit: Payer: Self-pay

## 2017-06-05 MED ORDER — PANTOPRAZOLE SODIUM 40 MG PO TBEC: 40.0000 mg | DELAYED_RELEASE_TABLET | Freq: Every day | ORAL | 3 refills | Status: DC

## 2017-06-05 NOTE — Telephone Encounter (Signed)
Called it to [facility]

## 2017-06-05 NOTE — Telephone Encounter (Signed)
pantoprazole (PROTONIX) 40 MG tablet, Refill request @ [facility] on [address]

## 2017-06-05 NOTE — Telephone Encounter (Signed)
Patient said that prescription was faxed to the wrong pharmacy it was sent to [facility] patient doesn't go there anymore, pls send to [facility] on [address]

## 2017-07-23 ENCOUNTER — Ambulatory Visit: Payer: BLUE CROSS/BLUE SHIELD | Admitting: Internal Medicine

## 2017-07-23 ENCOUNTER — Encounter: Payer: Self-pay | Admitting: Internal Medicine

## 2017-07-23 ENCOUNTER — Other Ambulatory Visit: Payer: Self-pay

## 2017-07-23 VITALS — BP 115/69 | HR 69 | Temp 98.3°F | Ht 65.0 in | Wt 294.1 lb

## 2017-07-23 DIAGNOSIS — Z23 Encounter for immunization: Secondary | ICD-10-CM | POA: Diagnosis not present

## 2017-07-23 DIAGNOSIS — Z79899 Other long term (current) drug therapy: Secondary | ICD-10-CM | POA: Diagnosis not present

## 2017-07-23 DIAGNOSIS — Z713 Dietary counseling and surveillance: Secondary | ICD-10-CM

## 2017-07-23 DIAGNOSIS — Z6841 Body Mass Index (BMI) 40.0 and over, adult: Secondary | ICD-10-CM | POA: Diagnosis not present

## 2017-07-23 DIAGNOSIS — M7989 Other specified soft tissue disorders: Secondary | ICD-10-CM

## 2017-07-23 DIAGNOSIS — I10 Essential (primary) hypertension: Secondary | ICD-10-CM

## 2017-07-23 NOTE — Progress Notes (Signed)
   CC: Foot swelling  HPI:  [name] is a 67 y.o. female with a past medical history listed below here today with complaints of foot swelling.  For details of today's visit and the status of her chronic medical issues please refer to the assessment and plan.   Past Medical History:  Diagnosis Date  . Acanthosis nigricans   . Allergic rhinitis   . Asthma   . Colonic polyp    Hyperplastic, no adenomatous or malignant changes.   . Elevated LFTs    Resolved- etiology unclear hep B, Hep C serology neg 2005  . Endometrial polyp    bemogm 12/06  . Gastric ulcer 02/20/2013  . Gastric ulcer with hemorrhage 02/20/2013  . Hx of abnormal cervical Pap smear    1980 normal in 2006.   Marland Kitchen Hyperlipidemia   . Hypertension   . Iron deficiency anemia    postmenopausal bleeding followed by Dr. Kalman Shan.  Hx of benign endometrial polyp.   . Lumbar back pain   . Obesity    morbid obesity  . Premenopausal menorrhagia   . Right knee pain    Review of Systems:   No chest pain or shortness of breath3  Physical Exam:  Vitals:   07/23/17 1515  BP: 115/69  Pulse: 69  Temp: 98.3 F (36.8 C)  TempSrc: Oral  SpO2: 99%  Weight: 294 lb 1.6 oz (133.4 kg)  Height: 5\' 5"  (1.651 m)   GENERAL- alert, co-operative, appears as stated age, not in any distress. HEENT- Atraumatic, normocephalic, PERRL, EOMI, oral mucosa appears moist CARDIAC- RRR, no murmurs, rubs or gallops. RESP- Moving equal volumes of air, and clear to auscultation bilaterally, no wheezes or crackles. ABDOMEN- Soft, nontender, bowel sounds present. NEURO- No obvious Cr N abnormality. EXTREMITIES- pulse 2+, symmetric, no pedal edema.  SKIN- Warm, dry, No rash or lesion. MSK - Full range of motion in left ankle, no erythema, warmth or edema. No tenderness to palpation. Able to ambulate without difficulty.  PSYCH- Normal mood and affect, appropriate thought content and speech.   Assessment & Plan:   See Encounters Tab for  problem based charting.  Patient discussed with Dr. Angelia Mould

## 2017-07-23 NOTE — Patient Instructions (Addendum)
Ms. Blandino,  Please meet with Butch Penny and keep working on the weight loss.  Follow up in 6 months for recheck.   Liraglutide injection (Weight Management) What is this medicine? LIRAGLUTIDE (LIR a GLOO tide) is used with a reduced calorie diet and exercise to help you lose weight. This medicine may be used for other purposes; ask your health care provider or pharmacist if you have questions. COMMON BRAND NAME(S): Saxenda What should I tell my health care provider before I take this medicine? They need to know if you have any of these conditions: -endocrine tumors (MEN 2) or if someone in your family had these tumors -gallbladder disease -high cholesterol -history of alcohol abuse problem -history of pancreatitis -kidney disease or if you are on dialysis -liver disease -previous swelling of the tongue, face, or lips with difficulty breathing, difficulty swallowing, hoarseness, or tightening of the throat -stomach problems -suicidal thoughts, plans, or attempt; a previous suicide attempt by you or a family member -thyroid cancer or if someone in your family had thyroid cancer -an unusual or allergic reaction to liraglutide, other medicines, foods, dyes, or preservatives -pregnant or trying to get pregnant -breast-feeding How should I use this medicine? This medicine is for injection under the skin of your upper leg, stomach area, or upper arm. You will be taught how to prepare and give this medicine. Use exactly as directed. Take your medicine at regular intervals. Do not take it more often than directed. It is important that you put your used needles and syringes in a special sharps container. Do not put them in a trash can. If you do not have a sharps container, call your pharmacist or healthcare provider to get one. A special MedGuide will be given to you by the pharmacist with each prescription and refill. Be sure to read this information carefully each time. Talk to your pediatrician  regarding the use of this medicine in children. Special care may be needed. Overdosage: If you think you have taken too much of this medicine contact a poison control center or emergency room at once. NOTE: This medicine is only for you. Do not share this medicine with others. What if I miss a dose? If you miss a dose, take it as soon as you can. If it is almost time for your next dose, take only that dose. Do not take double or extra doses. If you miss your dose for 3 days or more, call your doctor or health care professional to talk about how to restart this medicine. What may interact with this medicine? -insulin and other medicines for diabetes This list may not describe all possible interactions. Give your health care provider a list of all the medicines, herbs, non-prescription drugs, or dietary supplements you use. Also tell them if you smoke, drink alcohol, or use illegal drugs. Some items may interact with your medicine. What should I watch for while using this medicine? Visit your doctor or health care professional for regular checks on your progress. This medicine is intended to be used in addition to a healthy diet and appropriate exercise. The best results are achieved this way. Do not increase or in any way change your dose without consulting your doctor or health care professional. Drink plenty of fluids while taking this medicine. Check with your doctor or health care professional if you get an attack of severe diarrhea, nausea, and vomiting. The loss of too much body fluid can make it dangerous for you to take this medicine.  This medicine may affect blood sugar levels. If you have diabetes, check with your doctor or health care professional before you change your diet or the dose of your diabetic medicine. Patients and their families should watch out for worsening depression or thoughts of suicide. Also watch out for sudden changes in feelings such as feeling anxious, agitated, panicky,  irritable, hostile, aggressive, impulsive, severely restless, overly excited and hyperactive, or not being able to sleep. If this happens, especially at the beginning of treatment or after a change in dose, call your health care professional. What side effects may I notice from receiving this medicine? Side effects that you should report to your doctor or health care professional as soon as possible: -allergic reactions like skin rash, itching or hives, swelling of the face, lips, or tongue -breathing problems -diarrhea that continues or is severe -lump or swelling on the neck -severe nausea -signs and symptoms of infection like fever or chills; cough; sore throat; pain or trouble passing urine -signs and symptoms of low blood sugar such as feeling anxious, confusion, dizziness, increased hunger, unusually weak or tired, sweating, shakiness, cold, irritable, headache, blurred vision, fast heartbeat, loss of consciousness -signs and symptoms of kidney injury like trouble passing urine or change in the amount of urine -trouble swallowing -unusual stomach upset or pain -vomiting Side effects that usually do not require medical attention (report to your doctor or health care professional if they continue or are bothersome): -constipation -decreased appetite -diarrhea -fatigue -headache -nausea -pain, redness, or irritation at site where injected -stomach upset -stuffy or runny nose This list may not describe all possible side effects. Call your doctor for medical advice about side effects. You may report side effects to FDA at 1-800-FDA-1088. Where should I keep my medicine? Keep out of the reach of children. Store unopened pen in a refrigerator between 2 and 8 degrees C (36 and 46 degrees F). Do not freeze or use if the medicine has been frozen. Protect from light and excessive heat. After you first use the pen, it can be stored at room temperature between 15 and 30 degrees C (59 and 86  degrees F) or in a refrigerator. Throw away your used pen after 30 days or after the expiration date, whichever comes first. Do not store your pen with the needle attached. If the needle is left on, medicine may leak from the pen. NOTE: This sheet is a summary. It may not cover all possible information. If you have questions about this medicine, talk to your doctor, pharmacist, or health care provider.  2018 Elsevier/Gold Standard (2016-06-06 14:41:37)

## 2017-07-24 DIAGNOSIS — M7989 Other specified soft tissue disorders: Secondary | ICD-10-CM | POA: Insufficient documentation

## 2017-07-24 LAB — BMP8+ANION GAP
Anion Gap: 18 mmol/L (ref 10.0–18.0)
BUN/Creatinine Ratio: 10 — ABNORMAL LOW (ref 12–28)
BUN: 11 mg/dL (ref 8–27)
CALCIUM: 9.4 mg/dL (ref 8.7–10.3)
CHLORIDE: 98 mmol/L (ref 96–106)
CO2: 25 mmol/L (ref 20–29)
Creatinine, Ser: 1.07 mg/dL — ABNORMAL HIGH (ref 0.57–1.00)
GFR, EST AFRICAN AMERICAN: 63 mL/min/{1.73_m2} (ref >59)
GFR, EST NON AFRICAN AMERICAN: 55 mL/min/{1.73_m2} — AB (ref >59)
GLUCOSE: 94 mg/dL (ref 65–99)
POTASSIUM: 3.4 mmol/L — AB (ref 3.5–5.2)
SODIUM: 141 mmol/L (ref 134–144)

## 2017-07-24 NOTE — Assessment & Plan Note (Signed)
Weight stable at 294 lbs today. Continued discussion of her dietary habits and exercise habits. She reports she has been working on her diet and can't understand why she isn't losing weight. She notes walking for 30+ minutes 3x a week as well as Zomba on Saturdays. She does note that her clothes feel looser.   A/P She is agreeable to meeting with Butch Penny for further discussion of her eating habits and nutritional education.  Discussion possible medication options to help her with weight loss including Saxenda and provided information to review for her. She says she will think about it. Also discussed the possibility of bariatric surgery in the future if needed.

## 2017-07-24 NOTE — Assessment & Plan Note (Signed)
BP Readings from Last 3 Encounters:  07/23/17 115/69  01/15/17 110/81  05/08/16 (!) 129/50    Lab Results  Component Value Date   NA 141 07/23/2017   K 3.4 (L) 07/23/2017   CREATININE 1.07 (H) 07/23/2017   She is currently only taking HCTZ 25 mg daily. She reports compliance. BP is well controlled today. She denies any side effects. No chest pain, shortness of breath, dizziness/lightheadedness.   A/P: Continue current medications

## 2017-07-24 NOTE — Progress Notes (Signed)
Internal Medicine Clinic Attending  Case discussed with Dr. Boswell at the time of the visit.  We reviewed the resident's history and exam and pertinent patient test results.  I agree with the assessment, diagnosis, and plan of care documented in the resident's note.  

## 2017-07-24 NOTE — Assessment & Plan Note (Signed)
HPI  She reports that she was in her usual state of health on Saturday. After her Zoomba class she noted pain in her left foot with walking and swelling. She initially was unable to walk on that foot due to pain but this has improved over the past several days. She notes that the swelling has resolved. She is able to ambulate without pain today. Denies any trauma. She has not dramatically increased her exercise regimen recently and has been walking and doing Zoomba classes at the same intensity for several months. No edema or pain noted on exam today.  A/P Since her symptoms have resolved will continue with conservative therapy. Would consider stress fracture if symptoms return and are problematic.

## 2017-08-05 ENCOUNTER — Ambulatory Visit (INDEPENDENT_AMBULATORY_CARE_PROVIDER_SITE_OTHER): Payer: BLUE CROSS/BLUE SHIELD | Admitting: Dietician

## 2017-08-05 DIAGNOSIS — Z6841 Body Mass Index (BMI) 40.0 and over, adult: Secondary | ICD-10-CM | POA: Diagnosis not present

## 2017-08-05 DIAGNOSIS — Z713 Dietary counseling and surveillance: Secondary | ICD-10-CM | POA: Diagnosis not present

## 2017-08-05 NOTE — Progress Notes (Signed)
  Medical Nutrition Therapy:  Appt start time: 0932 end time:  1120. Visit # 1  Assessment:  Primary concerns today: weight loss and preventing diabetes.  [name] wants to be sure what she is eating is healthy and that she is on the right track. She's lost 27# in the past 4 years by making changes to her diet and increasing her activity. She lives with her daughter who is supportive. She did weight watchers a long time ago but would prefer to eat her own food and do the weight loss on her own   Preferred Learning Style: No preference indicated  Learning Readiness: Ready, change in progress  Estimated body mass index is 48.84 kg/m as calculated from the following:   Height as of this encounter: 5\' 5"  (1.651 m).   Weight as of this encounter: 293 lb 8 oz (133.1 kg).  WEIGHT HISTORY: Highest: 323# Lowest-292# Current Outpatient Medications on File Prior to Visit  Medication Sig Dispense Refill  . aspirin 81 MG EC tablet Take 81 mg by mouth daily.      . hydrochlorothiazide (HYDRODIURIL) 25 MG tablet TAKE 1 TABLET (40 MG TOTAL) BY MOUTH DAILY. 90 tablet 2  . pantoprazole (PROTONIX) 40 MG tablet Take 1 tablet (40 mg total) by mouth daily. 90 tablet 3  . pravastatin (PRAVACHOL) 40 MG tablet Take 1 tablet (40 mg total) by mouth daily. 90 tablet 2   No current facility-administered medications on file prior to visit.     Lipid Panel     Component Value Date/Time   CHOL 206 (H) 01/15/2017 1515   TRIG 121 01/15/2017 1515   HDL 63 01/15/2017 1515   CHOLHDL 3.3 01/15/2017 1515   CHOLHDL 3.5 11/30/2013 0946   VLDL 22 11/30/2013 0946   LDLCALC 119 (H) 01/15/2017 1515    Lab Results  Component Value Date   HGBA1C 6.1 (H) 01/15/2017    DIETARY INTAKE: Usual eating pattern includes 3 meals and 1-2 snacks per day. Everyday foods include chips, fruits, potatoes, broccoli, corn. No avoided foods  24-hr recall:  s (7 AM): yogurt, coffee with 2 tsp powder creamer, 2 tsp sugar Goes back  to sleep B (9 AM): 2 eggs with butter and cheese, sometimes potatoes L (12-1 PM): 1/2 [place] spam sandwich, water Sometimes naps D ( PM): baked chicken, roasted potatoes, asparagus Snk ( PM): green tea with 2 tsp sugar Beverages: coffee, water, green tea Sleeps 9-11 Pm then sometimes up for an hour watching TV, then back asleep until 7 AM Usual physical activity: zumba on saturdays, ADLs  Estimated energy needs:1800-2100 calories for weight loss, 2250-2400 for weight maintenance   Progress Towards Goal(s):  In progress.   Nutritional Diagnosis:  NB-1.1 Food and nutrition-related knowledge deficit As related to lack of prior meal planning training.  As evidenced by her report and questions.    Intervention:  Nutrition education about weight loss basics, how and why to keep a food record, importance of good sleep hygiene.  Coordination of care:   Teaching Method Utilized: Visual,Auditory, Hands on Handouts given during visit include: book on weight loss Barriers to learning/adherence to lifestyle change: competing values Demonstrated degree of understanding via:  Teach Back   Monitoring/Evaluation:  Dietary intake, exercise, and body weight in 1 week(s).Anticipate at least monthly visit for next 6-12 months Debera Lat, RD 08/05/2017 11:45 AM.

## 2017-08-05 NOTE — Patient Instructions (Addendum)
What we talked about today:   Calories Carbohydrates, Protein and Fats  The plan:   Keep records of all food and beverages you eat and drink for the next week. Please feel free to bring in labels. Please write down ingredients of home cooked foods. You can guesstimate or better yet measure how much you use.    Call anytime with questions or concerns  Debera Lat Diabetes Educator [phone]    Calorie Counting for Weight Loss Calories are units of energy. Your body needs a certain amount of calories from food to keep you going throughout the day. When you eat more calories than your body needs, your body stores the extra calories as fat. When you eat fewer calories than your body needs, your body burns fat to get the energy it needs. Calorie counting means keeping track of how many calories you eat and drink each day. Calorie counting can be helpful if you need to lose weight. If you make sure to eat fewer calories than your body needs, you should lose weight. Ask your health care provider what a healthy weight is for you. For calorie counting to work, you will need to eat the right number of calories in a day in order to lose a healthy amount of weight per week. A dietitian can help you determine how many calories you need in a day and will give you suggestions on how to reach your calorie goal.  A healthy amount of weight to lose per week is usually 1-2 lb (0.5-0.9 kg). This usually means that your daily calorie intake should be reduced by 500-750 calories.  Eating 1,200 - 1,500 calories per day can help most women lose weight.  Eating 1,500 - 1,800 calories per day can help most men lose weight.  What is my plan? My goal is to have __________ calories per day. If I have this many calories per day, I should lose around __________ pounds per week. What do I need to know about calorie counting? In order to meet your daily calorie goal, you will need to:  Find out how many  calories are in each food you would like to eat. Try to do this before you eat.  Decide how much of the food you plan to eat.  Write down what you ate and how many calories it had. Doing this is called keeping a food log.  To successfully lose weight, it is important to balance calorie counting with a healthy lifestyle that includes regular activity. Aim for 150 minutes of moderate exercise (such as walking) or 75 minutes of vigorous exercise (such as running) each week. Where do I find calorie information?  The number of calories in a food can be found on a Nutrition Facts label. If a food does not have a Nutrition Facts label, try to look up the calories online or ask your dietitian for help. Remember that calories are listed per serving. If you choose to have more than one serving of a food, you will have to multiply the calories per serving by the amount of servings you plan to eat. For example, the label on a package of bread might say that a serving size is 1 slice and that there are 90 calories in a serving. If you eat 1 slice, you will have eaten 90 calories. If you eat 2 slices, you will have eaten 180 calories. How do I keep a food log? Immediately after each meal, record the following information in your  food log:  What you ate. Don't forget to include toppings, sauces, and other extras on the food.  How much you ate. This can be measured in cups, ounces, or number of items.  How many calories each food and drink had.  The total number of calories in the meal.  Keep your food log near you, such as in a small notebook in your pocket, or use a mobile app or website. Some programs will calculate calories for you and show you how many calories you have left for the day to meet your goal. What are some calorie counting tips?  Use your calories on foods and drinks that will fill you up and not leave you hungry: ? Some examples of foods that fill you up are nuts and nut butters,  vegetables, lean proteins, and high-fiber foods like whole grains. High-fiber foods are foods with more than 5 g fiber per serving. ? Drinks such as sodas, specialty coffee drinks, alcohol, and juices have a lot of calories, yet do not fill you up.  Eat nutritious foods and avoid empty calories. Empty calories are calories you get from foods or beverages that do not have many vitamins or protein, such as candy, sweets, and soda. It is better to have a nutritious high-calorie food (such as an avocado) than a food with few nutrients (such as a bag of chips).  Know how many calories are in the foods you eat most often. This will help you calculate calorie counts faster.  Pay attention to calories in drinks. Low-calorie drinks include water and unsweetened drinks.  Pay attention to nutrition labels for "low fat" or "fat free" foods. These foods sometimes have the same amount of calories or more calories than the full fat versions. They also often have added sugar, starch, or salt, to make up for flavor that was removed with the fat.  Find a way of tracking calories that works for you. Get creative. Try different apps or programs if writing down calories does not work for you. What are some portion control tips?  Know how many calories are in a serving. This will help you know how many servings of a certain food you can have.  Use a measuring cup to measure serving sizes. You could also try weighing out portions on a kitchen scale. With time, you will be able to estimate serving sizes for some foods.  Take some time to put servings of different foods on your favorite plates, bowls, and cups so you know what a serving looks like.  Try not to eat straight from a bag or box. Doing this can lead to overeating. Put the amount you would like to eat in a cup or on a plate to make sure you are eating the right portion.  Use smaller plates, glasses, and bowls to prevent overeating.  Try not to multitask  (for example, watch TV or use your computer) while eating. If it is time to eat, sit down at a table and enjoy your food. This will help you to know when you are full. It will also help you to be aware of what you are eating and how much you are eating. What are tips for following this plan? Reading food labels  Check the calorie count compared to the serving size. The serving size may be smaller than what you are used to eating.  Check the source of the calories. Make sure the food you are eating is high in vitamins and  protein and low in saturated and trans fats. Shopping  Read nutrition labels while you shop. This will help you make healthy decisions before you decide to purchase your food.  Make a grocery list and stick to it. Cooking  Try to cook your favorite foods in a healthier way. For example, try baking instead of frying.  Use low-fat dairy products. Meal planning  Use more fruits and vegetables. Half of your plate should be fruits and vegetables.  Include lean proteins like poultry and fish. How do I count calories when eating out?  Ask for smaller portion sizes.  Consider sharing an entree and sides instead of getting your own entree.  If you get your own entree, eat only half. Ask for a box at the beginning of your meal and put the rest of your entree in it so you are not tempted to eat it.  If calories are listed on the menu, choose the lower calorie options.  Choose dishes that include vegetables, fruits, whole grains, low-fat dairy products, and lean protein.  Choose items that are boiled, broiled, grilled, or steamed. Stay away from items that are buttered, battered, fried, or served with cream sauce. Items labeled "crispy" are usually fried, unless stated otherwise.  Choose water, low-fat milk, unsweetened iced tea, or other drinks without added sugar. If you want an alcoholic beverage, choose a lower calorie option such as a glass of wine or light beer.  Ask  for dressings, sauces, and syrups on the side. These are usually high in calories, so you should limit the amount you eat.  If you want a salad, choose a garden salad and ask for grilled meats. Avoid extra toppings like bacon, cheese, or fried items. Ask for the dressing on the side, or ask for olive oil and vinegar or lemon to use as dressing.  Estimate how many servings of a food you are given. For example, a serving of cooked rice is  cup or about the size of half a baseball. Knowing serving sizes will help you be aware of how much food you are eating at restaurants. The list below tells you how big or small some common portion sizes are based on everyday objects: ? 1 oz-4 stacked dice. ? 3 oz-1 deck of cards. ? 1 tsp-1 die. ? 1 Tbsp- a ping-pong ball. ? 2 Tbsp-1 ping-pong ball. ?  cup- baseball. ? 1 cup-1 baseball. Summary  Calorie counting means keeping track of how many calories you eat and drink each day. If you eat fewer calories than your body needs, you should lose weight.  A healthy amount of weight to lose per week is usually 1-2 lb (0.5-0.9 kg). This usually means reducing your daily calorie intake by 500-750 calories.  The number of calories in a food can be found on a Nutrition Facts label. If a food does not have a Nutrition Facts label, try to look up the calories online or ask your dietitian for help.  Use your calories on foods and drinks that will fill you up, and not on foods and drinks that will leave you hungry.  Use smaller plates, glasses, and bowls to prevent overeating. This information is not intended to replace advice given to you by your health care provider. Make sure you discuss any questions you have with your health care provider. Document Released: 05/20/2005 Document Revised: 04/19/2016 Document Reviewed: 04/19/2016 Elsevier Interactive Patient Education  [employer].

## 2017-08-12 ENCOUNTER — Encounter: Payer: Self-pay | Admitting: Dietician

## 2017-08-12 ENCOUNTER — Ambulatory Visit (INDEPENDENT_AMBULATORY_CARE_PROVIDER_SITE_OTHER): Payer: BLUE CROSS/BLUE SHIELD | Admitting: Dietician

## 2017-08-12 DIAGNOSIS — Z713 Dietary counseling and surveillance: Secondary | ICD-10-CM | POA: Diagnosis not present

## 2017-08-12 DIAGNOSIS — Z6841 Body Mass Index (BMI) 40.0 and over, adult: Secondary | ICD-10-CM

## 2017-08-12 NOTE — Progress Notes (Signed)
  Medical Nutrition Therapy:  Appt start time: 1030 end time:  1140. Visit # 2  Assessment:  Primary concerns today: weight loss and preventing diabetes.  Ms. Gane wants to be sure what she is eating is healthy and that she is on the right track. Her weight is without change. She kept a week of food records and tried to write down calories on some. She eats casseroles often, fried foods, processed foods She is verbalizing good comprehension of food label reading for calories.   Estimated body mass index is 48.97 kg/m as calculated from the following:   Height as of 08/05/17: 5\' 5"  (1.651 m).   Weight as of this encounter: 294 lb 4.8 oz (133.5 kg).   Current Outpatient Medications on File Prior to Visit  Medication Sig Dispense Refill  . aspirin 81 MG EC tablet Take 81 mg by mouth daily.      . hydrochlorothiazide (HYDRODIURIL) 25 MG tablet TAKE 1 TABLET (40 MG TOTAL) BY MOUTH DAILY. 90 tablet 2  . pantoprazole (PROTONIX) 40 MG tablet Take 1 tablet (40 mg total) by mouth daily. 90 tablet 3  . pravastatin (PRAVACHOL) 40 MG tablet Take 1 tablet (40 mg total) by mouth daily. 90 tablet 2   No current facility-administered medications on file prior to visit.    Estimated energy needs:1800+/- 100 calories for weight loss, 2250-2400 for weight maintenance  Progress Towards Goal(s):  In progress.   Nutritional Diagnosis:  NB-1.1 Food and nutrition-related knowledge deficit As related to lack of prior meal planning training.  As evidenced by her report and questions.    Intervention:  Nutrition education about calorie counting using her food records, weight loss basics, how and why to keep a food record, importance of increasing physical activity.   Coordination of care: consider weight loss medications if no weight loss at next visit  Teaching Method Utilized: Visual,Auditory, Hands on Handouts given during visit include: book on weight loss Barriers to learning/adherence to lifestyle  change: competing values Demonstrated degree of understanding via:  Teach Back   Monitoring/Evaluation:  Dietary intake, exercise, and body weight in 4 week(s).Anticipate at least monthly visit for next 6-12 months Debera Lat, RD 08/12/2017 12:05 PM.

## 2017-08-12 NOTE — Patient Instructions (Signed)
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 Way to go.    Homework: 1- continue to record all food and beverages you eat and drink and the amount each day 2- Also record the calories in the amount of those foods and drinks you had 3- Add up the total calories for the day- with goal being 1800 calories a day ( it will vary and should be between 1700- 1900 calories each day)    You can find the amount of calories in foods by 1- reading the label 2- looking I tup in a book 3- on the internet- my fitness pal has a good database to find many foods we eat   When you go to the store you can weigh a 3 ounce banana, a 4 oz potato and any other food in the book you need to become familiar with what a portion size looks like.  Pork and blood pressure- it is not the pork meat it is the fat and the processing- use the leanest pork and the least processed-( less ingredients)   Cereal- should be less than 5 grams added sugar and at least 3 grams of fiber per serving and preferable the first few ingredients contain a  whole grain( wheat- brown rice, whole grain corn, whole grain oat, etc...)   See you in April.

## 2017-08-14 ENCOUNTER — Encounter: Payer: Self-pay | Admitting: Internal Medicine

## 2017-08-26 ENCOUNTER — Encounter: Payer: Self-pay | Admitting: Internal Medicine

## 2017-09-16 ENCOUNTER — Ambulatory Visit (INDEPENDENT_AMBULATORY_CARE_PROVIDER_SITE_OTHER): Payer: BLUE CROSS/BLUE SHIELD | Admitting: Dietician

## 2017-09-16 ENCOUNTER — Other Ambulatory Visit: Payer: Self-pay | Admitting: Family Medicine

## 2017-09-16 ENCOUNTER — Encounter: Payer: Self-pay | Admitting: Dietician

## 2017-09-16 ENCOUNTER — Other Ambulatory Visit: Payer: Self-pay | Admitting: Internal Medicine

## 2017-09-16 DIAGNOSIS — Z1231 Encounter for screening mammogram for malignant neoplasm of breast: Secondary | ICD-10-CM

## 2017-09-16 DIAGNOSIS — Z713 Dietary counseling and surveillance: Secondary | ICD-10-CM

## 2017-09-16 DIAGNOSIS — Z6841 Body Mass Index (BMI) 40.0 and over, adult: Secondary | ICD-10-CM

## 2017-09-16 NOTE — Progress Notes (Signed)
  Medical Nutrition Therapy:  Appt start time: 1020 end time:  1120. Visit # 3  Assessment:  Primary concerns today: weight loss and preventing diabetes.  [name] wants to be sure what she is eating is healthy and that she is on the right track. Her weight is without change. She kept 4 weeks of food records. Her calorie total was mostly ~1200 calories and about 2 days a week 2100-2200. She is omitting oil and butter used in cooking, is off on portion sizes. She continues to  Verbalize good comprehension of food label reading for calories.   Estimated body mass index is 49.11 kg/m as calculated from the following:   Height as of 08/05/17: 5\' 5"  (1.651 m).   Weight as of this encounter: 295 lb 1.6 oz (133.9 kg).   Current Outpatient Medications on File Prior to Visit  Medication Sig Dispense Refill  . aspirin 81 MG EC tablet Take 81 mg by mouth daily.      . hydrochlorothiazide (HYDRODIURIL) 25 MG tablet TAKE 1 TABLET (40 MG TOTAL) BY MOUTH DAILY. 90 tablet 2  . pantoprazole (PROTONIX) 40 MG tablet Take 1 tablet (40 mg total) by mouth daily. 90 tablet 3  . pravastatin (PRAVACHOL) 40 MG tablet Take 1 tablet (40 mg total) by mouth daily. 90 tablet 2   No current facility-administered medications on file prior to visit.    Estimated energy needs:1800+/- 100 calories for weight loss, 2250-2400 for weight maintenance. May need to reevaluate this if no loss by next visit  Progress Towards Goal(s):  In progress.   Nutritional Diagnosis:  NB-1.1 Food and nutrition-related knowledge deficit As related to lack of prior meal planning training.  As evidenced by her report and questions.    Intervention:  Nutrition reveiw about calorie counting, more detailed education about how and why to keep a food record, importance of increasing physical activity.   Coordination of care: talk to doctor and patient about considering weight loss medication  Teaching Method Utilized: Visual,Auditory, Hands  on Handouts given during visit include: book on weight loss Barriers to learning/adherence to lifestyle change: competing values Demonstrated degree of understanding via:  Teach Back   Monitoring/Evaluation:  Dietary intake, exercise, and body weight in 2 week(s).Anticipate at least monthly visit for next 6-12 months Debera Lat, RD 09/16/2017 2:57 PM.

## 2017-09-16 NOTE — Patient Instructions (Addendum)
You are doing a great job recording your food intake and reading labels.  I think you need to boost your metabolism by taking a 30 minute walk every day. 15 minutes away from your house and 15 minutes back-   PLEASE measure this walk  one time to be sure the around the block takes 15 minutes back.  Please begin recoding your activity on the Food & Activity tracker-  Keep recoding your food intake with more detail- measure your butter, oil, cheese, margarine, ice cream, potato  Healthy snacks- include fruits, vegetables, nuts, whole grains   Trail mix- dried fruit, nuts, can include cereal ( Cheerios- grape nuts, chex mix)

## 2017-09-30 ENCOUNTER — Ambulatory Visit (INDEPENDENT_AMBULATORY_CARE_PROVIDER_SITE_OTHER): Payer: BLUE CROSS/BLUE SHIELD | Admitting: Dietician

## 2017-09-30 DIAGNOSIS — Z713 Dietary counseling and surveillance: Secondary | ICD-10-CM

## 2017-09-30 DIAGNOSIS — Z6841 Body Mass Index (BMI) 40.0 and over, adult: Secondary | ICD-10-CM

## 2017-09-30 NOTE — Patient Instructions (Signed)
Good job with the 3 pound weight loss! Did the soup or salad with meals help you eat l;ess? Was it more activity that helped?   Your food & activity records are showing more detail.  Now to aim for 5 servings of fruit and vegtables every day- aim for at least one fruit or veggie with each meal and snack  See you in 2 weeks.  Butch Penny [phone]

## 2017-09-30 NOTE — Progress Notes (Signed)
  Medical Nutrition Therapy:  Appt start time: 0914 end time:  1000. Visit # 4  Assessment:  Primary concerns today: weight loss and preventing diabetes.  [name] brought 2 weeks of completed food and activity records. Her weight is decreased 3#. She kept 4 weeks of food records. Her calorie total was mostly ~1700-1800 calories and her activity 30 minutes on most days. She is eating sweets about 1-2 times a day and says this is what is around her and what she has a desire for. Fruits are limited because of income.   Estimated body mass index is 48.59 kg/m as calculated from the following:   Height as of 08/05/17: 5\' 5"  (1.651 m).   Weight as of this encounter: 292 lb (132.5 kg).   Current Outpatient Medications on File Prior to Visit  Medication Sig Dispense Refill  . aspirin 81 MG EC tablet Take 81 mg by mouth daily.      . hydrochlorothiazide (HYDRODIURIL) 25 MG tablet TAKE 1 TABLET (40 MG TOTAL) BY MOUTH DAILY. 90 tablet 2  . pantoprazole (PROTONIX) 40 MG tablet Take 1 tablet (40 mg total) by mouth daily. 90 tablet 3  . pravastatin (PRAVACHOL) 40 MG tablet Take 1 tablet (40 mg total) by mouth daily. 90 tablet 2   No current facility-administered medications on file prior to visit.    Estimated energy needs:1800+/- 100 calories for weight loss, 2250-2400 for weight maintenance.   Progress Towards Goal(s):  In progress.   Nutritional Diagnosis:  NB-1.1 Food and nutrition-related knowledge deficit As related to lack of prior meal planning training gradually improving  As evidenced by her report, questions keepig food records and increasing her activity.     Intervention:  Nutrition reveiw about non-hunger eating, habits, needs for at least 5 servings fruit and vegetables daily and importance of increasing physical activity.   Coordination of care: will be on Medicare soon- refer to MDPP program  Teaching Method Utilized: Visual,Auditory, Hands on Handouts given during visit include:  non- hunger eating handout, avs, 2 weeks of food and activity records Barriers to learning/adherence to lifestyle change: competing values Demonstrated degree of understanding via:  Teach Back   Monitoring/Evaluation:  Dietary intake, exercise, and body weight in 2 week(s). Butch Penny Jadesola Poynter, RD 09/30/2017 11:00 AM.

## 2017-10-08 ENCOUNTER — Ambulatory Visit
Admission: RE | Admit: 2017-10-08 | Discharge: 2017-10-08 | Disposition: A | Discharge status: Home / Self Care | Payer: Medicare Other | Source: Ambulatory Visit | Attending: *Deleted | Admitting: *Deleted

## 2017-10-08 DIAGNOSIS — Z1231 Encounter for screening mammogram for malignant neoplasm of breast: Secondary | ICD-10-CM

## 2017-10-09 ENCOUNTER — Other Ambulatory Visit: Payer: Self-pay | Admitting: Internal Medicine

## 2017-10-09 DIAGNOSIS — R928 Other abnormal and inconclusive findings on diagnostic imaging of breast: Secondary | ICD-10-CM

## 2017-10-13 ENCOUNTER — Ambulatory Visit
Admission: RE | Admit: 2017-10-13 | Discharge: 2017-10-13 | Disposition: A | Discharge status: Home / Self Care | Payer: Medicare Other | Source: Ambulatory Visit | Attending: *Deleted | Admitting: *Deleted

## 2017-10-13 ENCOUNTER — Other Ambulatory Visit: Payer: Self-pay | Admitting: Internal Medicine

## 2017-10-13 DIAGNOSIS — N6489 Other specified disorders of breast: Secondary | ICD-10-CM

## 2017-10-13 DIAGNOSIS — R928 Other abnormal and inconclusive findings on diagnostic imaging of breast: Secondary | ICD-10-CM

## 2017-10-14 ENCOUNTER — Ambulatory Visit: Payer: Medicare Other | Admitting: Dietician

## 2017-10-14 ENCOUNTER — Encounter: Payer: Self-pay | Admitting: Dietician

## 2017-10-14 NOTE — Progress Notes (Signed)
  Medical Nutrition Therapy:  Appt start time: 0914 end time:  1000. Visit # 4  Assessment:  Primary concerns today: weight loss and preventing diabetes.  [name] brought 2 weeks of completed food and activity records. Her weight is decreased ~1#.  Her calorie total was ~1200 and 1700 calories and her activity mostly is activities of daily living, except the one day she does Zumba. She is eating sweets less. Eating more fruits.   Estimated body mass index is 48.46 kg/m as calculated from the following:   Height as of 08/05/17: 5\' 5"  (1.651 m).   Weight as of this encounter: 291 lb 3.2 oz (132.1 kg).   Current Outpatient Medications on File Prior to Visit  Medication Sig Dispense Refill  . aspirin 81 MG EC tablet Take 81 mg by mouth daily.      . hydrochlorothiazide (HYDRODIURIL) 25 MG tablet TAKE 1 TABLET (40 MG TOTAL) BY MOUTH DAILY. 90 tablet 2  . pantoprazole (PROTONIX) 40 MG tablet Take 1 tablet (40 mg total) by mouth daily. 90 tablet 3  . pravastatin (PRAVACHOL) 40 MG tablet Take 1 tablet (40 mg total) by mouth daily. 90 tablet 2   No current facility-administered medications on file prior to visit.    Estimated energy needs:1200 +/- 100 calories for weight loss, 1800+/-100 for weight maintenance.   Progress Towards Goal(s):  In progress.   Nutritional Diagnosis:  NB-1.1 Food and nutrition-related knowledge deficit As related to lack of prior meal planning training gradually improving  As evidenced by her report, questions keepig food records and increasing her activity.     Intervention:  Nutrition reveiw of food records,  needs for at least 5 servings fruit and vegetables daily and importance of increasing physical activity.   Coordination of care: encourage silver sneakers/silver and fit, will be on Medicare soon- refer to MDPP program if she agrees  Teaching Method Utilized: Visual,Auditory, Hands on Handouts given during visit include: non- hunger eating handout, avs, 2  weeks of food and activity records Barriers to learning/adherence to lifestyle change: competing values Demonstrated degree of understanding via:  Teach Back   Monitoring/Evaluation:  Dietary intake, exercise, and body weight in 2 week(s). [name], RD 10/14/2017 9:59 AM.

## 2017-10-14 NOTE — Patient Instructions (Signed)
1- Good job on the 1/2 pound weight loss each week!  2- V-8 vegetable juice is a healthy choice- low sodium is better for blood pressure lowering  3- Start using weekly calorie totals on the back of the book My weekly totals for the past 2 weeks were:  ____________________________    ___________________________________ calories per day on average   4- can use computer to look up homemade foods- "google it"

## 2017-10-21 ENCOUNTER — Other Ambulatory Visit: Payer: Self-pay | Admitting: Internal Medicine

## 2017-10-21 DIAGNOSIS — Z6841 Body Mass Index (BMI) 40.0 and over, adult: Secondary | ICD-10-CM

## 2017-10-21 MED ORDER — PRAVASTATIN SODIUM 40 MG PO TABS: 40.0000 mg | ORAL_TABLET | Freq: Every day | ORAL | 1 refills | Status: DC

## 2017-10-21 MED ORDER — HYDROCHLOROTHIAZIDE 25 MG PO TABS: ORAL_TABLET | ORAL | 1 refills | Status: DC

## 2017-10-21 NOTE — Telephone Encounter (Signed)
Patient is requesting Pravastatin 40mg , hydrocholorothiazide 25mg , pls send to [facility]

## 2017-10-28 ENCOUNTER — Ambulatory Visit: Payer: Medicare Other | Admitting: Dietician

## 2017-10-28 ENCOUNTER — Ambulatory Visit (AMBULATORY_SURGERY_CENTER): Payer: Self-pay

## 2017-10-28 ENCOUNTER — Encounter: Payer: Self-pay | Admitting: Dietician

## 2017-10-28 VITALS — Ht 65.0 in | Wt 288.6 lb

## 2017-10-28 DIAGNOSIS — Z6841 Body Mass Index (BMI) 40.0 and over, adult: Secondary | ICD-10-CM

## 2017-10-28 DIAGNOSIS — Z86010 Personal history of colonic polyps: Secondary | ICD-10-CM

## 2017-10-28 NOTE — Progress Notes (Signed)
Per pt, no allergies to soy or egg products.Pt not taking any weight loss meds or using  O2 at home.  Pt refused emmi video. 

## 2017-10-28 NOTE — Progress Notes (Signed)
  Medical Nutrition Therapy:  Appt start time: 0933 end time:  1000. Visit # 5  Assessment:  Primary concerns today: weight loss and preventing diabetes.  Ms. Gotcher brought 2 weeks of completed food and activity records. Her weight is decreased 0.7#.  Her calorie total was ~1400-1500 calories. Her activity now includes walking 3-4 days a week, and Zumba x1 say a week. She is eating more vegetables and  fruits. Says she will think about attending the MDPP program.   Estimated body mass index is 48.34 kg/m as calculated from the following:   Height as of 08/05/17: 5\' 5"  (1.651 m).   Weight as of this encounter: 290 lb 8 oz (131.8 kg).   Wt Readings from Last 3 Encounters:  10/28/17 288 lb 9.6 oz (130.9 kg)  10/28/17 290 lb 8 oz (131.8 kg)  10/14/17 291 lb 3.2 oz (132.1 kg)      Current Outpatient Medications on File Prior to Visit  Medication Sig Dispense Refill  . aspirin 81 MG EC tablet Take 81 mg by mouth daily.      . hydrochlorothiazide (HYDRODIURIL) 25 MG tablet TAKE 1 TABLET (40 MG TOTAL) BY MOUTH DAILY. 90 tablet 1  . pantoprazole (PROTONIX) 40 MG tablet Take 1 tablet (40 mg total) by mouth daily. 90 tablet 3  . pravastatin (PRAVACHOL) 40 MG tablet Take 1 tablet (40 mg total) by mouth daily. 90 tablet 1   No current facility-administered medications on file prior to visit.    Estimated energy needs:1400 +/- 100 calories for weight loss, 1800+/-100 for weight maintenance.   Progress Towards Goal(s):  In progress.   Nutritional Diagnosis:  NB-1.1 Food and nutrition-related knowledge deficit As related to lack of prior meal planning training gradually improving  As evidenced by her report, questions keepig food records and increasing her activity.     Intervention:  Nutrition reveiw of food records,  needs for at least 5 servings fruit and vegetables daily and importance of increasing physical activity.   Coordination of care: encourage silver sneakers/silver and fit as  appropriate, refer to MDPP program if she agrees Teaching Method Utilized: Visual,Auditory, Hands on Handouts given during visit include: non- hunger eating handout, avs, 2 weeks of food and activity records Barriers to learning/adherence to lifestyle change: competing values Demonstrated degree of understanding via:  Teach Back   Monitoring/Evaluation:  Dietary intake, exercise, and body weight in 2 week(s). Debera Lat, RD 10/28/2017 10:52 AM.

## 2017-10-28 NOTE — Patient Instructions (Addendum)
[name],   I have taken the liberty to schedule your next appointment. If this does not work for you, please call to reschedule.    Also, please complete the following and bring with you to your next appointment:   Remember Your Purpose  Take a moment to respond to these questions in the space provided below. 1- Why do I want to keep coming to Nutrition appointments or join the Medicare Diabetes Prevention program?      2- What do I hope to achieve by taking part in these appointments or  program?       3- How will healthy eating and being active help me (and others)?

## 2017-10-31 ENCOUNTER — Encounter: Payer: Self-pay | Admitting: Internal Medicine

## 2017-11-10 ENCOUNTER — Ambulatory Visit: Payer: Medicare Other | Admitting: Dietician

## 2017-11-10 ENCOUNTER — Encounter: Payer: Self-pay | Admitting: Dietician

## 2017-11-10 DIAGNOSIS — R7303 Prediabetes: Secondary | ICD-10-CM

## 2017-11-10 NOTE — Progress Notes (Signed)
  Medical Nutrition Therapy:  Appt start time: 0920 end time:  0955. Visit # 6  Assessment:  Primary concerns today: weight loss and preventing diabetes.  Ms. Stonesifer brought 2 weeks of completed food and activity records. Her weight is decreased 2# more for a total of 9# in ~90 days.  Her calorie total was ~1200 calories/day. Her activity now includes walking 3-4 days a week, and Zumba x1 say a week. She is thinking of calling about attending the MDPP program. She has questions about prediabetes  Estimated body mass index is 47.69 kg/m as calculated from the following:   Height as of 10/28/17: 5\' 5"  (1.651 m).   Weight as of this encounter: 286 lb 9.6 oz (130 kg).  Goal weight ~ 270#  Wt Readings from Last 3 Encounters:  11/10/17 286 lb 9.6 oz (130 kg)  10/28/17 288 lb 9.6 oz (130.9 kg)  10/28/17 290 lb 8 oz (131.8 kg)    Current Outpatient Medications on File Prior to Visit  Medication Sig Dispense Refill  . aspirin 81 MG EC tablet Take 81 mg by mouth daily.      . bisacodyl (DULCOLAX) 5 MG EC tablet Take 5 mg by mouth. Dulcolax 5 mg bowel prep #4-Take as directed    . hydrochlorothiazide (HYDRODIURIL) 25 MG tablet TAKE 1 TABLET (40 MG TOTAL) BY MOUTH DAILY. 90 tablet 1  . NON FORMULARY Alive Vitamins-Take one daily    . pantoprazole (PROTONIX) 40 MG tablet Take 1 tablet (40 mg total) by mouth daily. 90 tablet 3  . Polyethylene Glycol 3350 (MIRALAX PO) Take by mouth. Miralax 238 gm bowel prep-Take as directed    . pravastatin (PRAVACHOL) 40 MG tablet Take 1 tablet (40 mg total) by mouth daily. 90 tablet 1   No current facility-administered medications on file prior to visit.    Estimated energy needs:1200-1400 calories for weight loss, 1800+/-100 for weight maintenance.   Progress Towards Goal(s):  In progress.   Nutritional Diagnosis:  NB-1.1 Food and nutrition-related knowledge deficit As related to lack of prior meal planning training gradually improving  As evidenced by her  report, questions keeping food records and increasing her activity.     Intervention:  Nutrition education about prediabetes, Medicare prediabetes program, her A1C result and her reasons for prevention.    Coordination of care: encourage silver sneakers/silver and fit as appropriate, refer to MDPP program if she agrees Teaching Method Utilized: Visual,Auditory, Hands on Handouts given during visit include: avs, 2 weeks of food and activity records Barriers to learning/adherence to lifestyle change: competing values Demonstrated degree of understanding via:  Teach Back   Monitoring/Evaluation:  Dietary intake, exercise, and body weight in 2 week(s). Butch Penny Plyler, RD 11/10/2017 11:12 AM.

## 2017-11-10 NOTE — Patient Instructions (Addendum)
Good Job with the 2# weight in the last 2 weeks!  Keep up the great work!   Suggest you start writing down total minutes of daily activity on the back of your Food & Activity Tracker. Goal is at least 150 minutes per week- 200 for weight loss.

## 2017-11-11 ENCOUNTER — Other Ambulatory Visit: Payer: Self-pay

## 2017-11-11 ENCOUNTER — Encounter: Payer: Self-pay | Admitting: Internal Medicine

## 2017-11-11 ENCOUNTER — Ambulatory Visit (AMBULATORY_SURGERY_CENTER): Payer: Medicare Other | Admitting: Internal Medicine

## 2017-11-11 VITALS — BP 134/52 | HR 61 | Temp 97.8°F | Resp 11 | Ht 65.0 in | Wt 294.0 lb

## 2017-11-11 DIAGNOSIS — D123 Benign neoplasm of transverse colon: Secondary | ICD-10-CM

## 2017-11-11 DIAGNOSIS — Z86010 Personal history of colonic polyps: Secondary | ICD-10-CM

## 2017-11-11 DIAGNOSIS — D122 Benign neoplasm of ascending colon: Secondary | ICD-10-CM | POA: Diagnosis not present

## 2017-11-11 MED ORDER — SODIUM CHLORIDE 0.9 % IV SOLN: 500.0000 mL | Freq: Once | INTRAVENOUS | Status: DC

## 2017-11-11 NOTE — Progress Notes (Signed)
Pt's states no medical or surgical changes since previsit or office visit. 

## 2017-11-11 NOTE — Patient Instructions (Addendum)
   I found and removed 3 tiny polyps.  All else ok  I will let you know pathology results and when to have another routine colonoscopy by mail and/or My Chart.  I appreciate the opportunity to care for you. Gatha Mayer, MD, FACG YOU HAD AN ENDOSCOPIC PROCEDURE TODAY AT [facility]:   Refer to the procedure report that was given to you for any specific questions about what was found during the examination.  If the procedure report does not answer your questions, please call your gastroenterologist to clarify.  If you requested that your care partner not be given the details of your procedure findings, then the procedure report has been included in a sealed envelope for you to review at your convenience later.  YOU SHOULD EXPECT: Some feelings of bloating in the abdomen. Passage of more gas than usual.  Walking can help get rid of the air that was put into your GI tract during the procedure and reduce the bloating. If you had a lower endoscopy (such as a colonoscopy or flexible sigmoidoscopy) you may notice spotting of blood in your stool or on the toilet paper. If you underwent a bowel prep for your procedure, you may not have a normal bowel movement for a few days.  Please Note:  You might notice some irritation and congestion in your nose or some drainage.  This is from the oxygen used during your procedure.  There is no need for concern and it should clear up in a day or so.  SYMPTOMS TO REPORT IMMEDIATELY:   Following lower endoscopy (colonoscopy or flexible sigmoidoscopy):  Excessive amounts of blood in the stool  Significant tenderness or worsening of abdominal pains  Swelling of the abdomen that is new, acute  Fever of 100F or higher  For urgent or emergent issues, a gastroenterologist can be reached at any hour by calling [phone].   DIET:  We do recommend a small meal at first, but then you may proceed to your regular diet.  Drink plenty of fluids  but you should avoid alcoholic beverages for 24 hours.  ACTIVITY:  You should plan to take it easy for the rest of today and you should NOT DRIVE or use heavy machinery until tomorrow (because of the sedation medicines used during the test).    FOLLOW UP: Our staff will call the number listed on your records the next business day following your procedure to check on you and address any questions or concerns that you may have regarding the information given to you following your procedure. If we do not reach you, we will leave a message.  However, if you are feeling well and you are not experiencing any problems, there is no need to return our call.  We will assume that you have returned to your regular daily activities without incident.  If any biopsies were taken you will be contacted by phone or by letter within the next 1-3 weeks.  Please call us at [phone] if you have not heard about the biopsies in 3 weeks.   Await for biopsy results Polyps (handout given)   SIGNATURES/CONFIDENTIALITY: You and/or your care partner have signed paperwork which will be entered into your electronic medical record.  These signatures attest to the fact that that the information above on your After Visit Summary has been reviewed and is understood.  Full responsibility of the confidentiality of this discharge information lies with you and/or your care-partner.

## 2017-11-11 NOTE — Op Note (Signed)
[place] Patient Name: [name] Procedure Date: 11/11/2017 9:57 AM MRN: [id number] Endoscopist: Gatha Mayer , MD Age: 67 Referring MD:  Date of Birth: Jun 12, 1950 Gender: Female Account #: [id number] Procedure:                Colonoscopy Indications:              High risk colon cancer surveillance: Personal                            history of sessile serrated colon polyp (less than                            10 mm in size) with no dysplasia Medicines:                Propofol per Anesthesia, Monitored Anesthesia Care Procedure:                Pre-Anesthesia Assessment:                           - Prior to the procedure, a History and Physical                            was performed, and patient medications and                            allergies were reviewed. The patient's tolerance of                            previous anesthesia was also reviewed. The risks                            and benefits of the procedure and the sedation                            options and risks were discussed with the patient.                            All questions were answered, and informed consent                            was obtained. Prior Anticoagulants: The patient has                            taken no previous anticoagulant or antiplatelet                            agents. ASA Grade Assessment: II - A patient with                            mild systemic disease. After reviewing the risks                            and benefits, the patient was deemed in  satisfactory condition to undergo the procedure.                           After obtaining informed consent, the colonoscope                            was passed under direct vision. Throughout the                            procedure, the patient's blood pressure, pulse, and                            oxygen saturations were monitored continuously. The   Colonoscope was introduced through the anus and                            advanced to the the cecum, identified by                            appendiceal orifice and ileocecal valve. The                            colonoscopy was performed without difficulty. The                            patient tolerated the procedure well. The quality                            of the bowel preparation was good. The bowel                            preparation used was Miralax. The ileocecal valve,                            appendiceal orifice, and rectum were photographed. Scope In: 10:11:35 AM Scope Out: 10:29:20 AM Scope Withdrawal Time: 0 hours 15 minutes 2 seconds  Total Procedure Duration: 0 hours 17 minutes 45 seconds  Findings:                 The perianal and digital rectal examinations were                            normal.                           A 4 mm polyp was found in the ascending colon. The                            polyp was sessile. The polyp was removed with a                            cold snare. Resection and retrieval were complete.                            Verification of  patient identification for the                            specimen was done. Estimated blood loss was minimal.                           Two sessile polyps were found in the transverse                            colon and ascending colon. The polyps were 1 to 2                            mm in size. These polyps were removed with a cold                            biopsy forceps. Resection and retrieval were                            complete. Verification of patient identification                            for the specimen was done. Estimated blood loss was                            minimal.                           The exam was otherwise without abnormality on                            direct and retroflexion views. Complications:            No immediate complications. Estimated Blood Loss:      Estimated blood loss was minimal. Impression:               - One 4 mm polyp in the ascending colon, removed                            with a cold snare. Resected and retrieved.                           - Two 1 to 2 mm polyps in the transverse colon and                            in the ascending colon, removed with a cold biopsy                            forceps. Resected and retrieved.                           - The examination was otherwise normal on direct                            and retroflexion views.                           -  Personal history of colonic polyp ssp 2014. Recommendation:           - Patient has a contact number available for                            emergencies. The signs and symptoms of potential                            delayed complications were discussed with the                            patient. Return to normal activities tomorrow.                            Written discharge instructions were provided to the                            patient.                           - Resume previous diet.                           - Continue present medications.                           - Repeat colonoscopy is recommended. The                            colonoscopy date will be determined after pathology                            results from today's exam become available for                            review. Gatha Mayer, MD 11/11/2017 10:38:13 AM This report has been signed electronically.

## 2017-11-11 NOTE — Progress Notes (Signed)
Called to room to assist during endoscopic procedure.  Patient ID and intended procedure confirmed with present staff. Received instructions for my participation in the procedure from the performing physician.  

## 2017-11-12 ENCOUNTER — Telehealth: Payer: Self-pay | Admitting: *Deleted

## 2017-11-12 NOTE — Telephone Encounter (Signed)
  Follow up Call-  Call back number 11/11/2017  Post procedure Call Back phone  # [phone]  Permission to leave phone message Yes  Some recent data might be hidden     Patient questions:  Do you have a fever, pain , or abdominal swelling? No. Pain Score  0 *  Have you tolerated food without any problems? Yes.    Have you been able to return to your normal activities? Yes.    Do you have any questions about your discharge instructions: Diet   No. Medications  No. Follow up visit  No.  Do you have questions or concerns about your Care? No.  Actions: * If pain score is 4 or above: No action needed, pain <4.

## 2017-11-16 ENCOUNTER — Encounter: Payer: Self-pay | Admitting: Internal Medicine

## 2017-11-16 NOTE — Progress Notes (Signed)
1 of 3 was adenoma 2 mucosal polyps Recall 2024 My Chart

## 2017-11-25 ENCOUNTER — Other Ambulatory Visit: Payer: Self-pay | Admitting: Dietician

## 2017-11-25 ENCOUNTER — Encounter: Payer: Self-pay | Admitting: Dietician

## 2017-11-25 ENCOUNTER — Ambulatory Visit: Payer: Medicare Other | Admitting: Dietician

## 2017-11-25 DIAGNOSIS — R7303 Prediabetes: Secondary | ICD-10-CM

## 2017-11-25 NOTE — Progress Notes (Signed)
  Medical Nutrition Therapy:  Appt start time: 0920 end time:  0955. Visit # 7  Assessment:  Primary concerns today: weight loss and preventing diabetes.  [name] brought 2 weeks of completed food and activity records. Her weight is decreased 3# more for a total of 12# loss in ~90 days.  Her calorie total was ~1200 calories/day. Her activity now includes walking 3-4 days a week, and Zumba x1 say a week. She is agrees to be referred to the [facility] Diabetes Prevention Program( MDPP).  Her food records show average caloric intakes of 1073 and 1085 per day. This is very low, ask her to be sure she is including all and then think about increasing her activity.   Estimated body mass index is 47.23 kg/m as calculated from the following:   Height as of 11/11/17: 5\' 5"  (1.651 m).   Weight as of this encounter: 283 lb 12.8 oz (128.7 kg).  Goal weight ~ 270#  Wt Readings from Last 3 Encounters:  11/25/17 283 lb 12.8 oz (128.7 kg)  11/11/17 294 lb (133.4 kg)  11/10/17 286 lb 9.6 oz (130 kg)   Current Outpatient Medications on File Prior to Visit  Medication Sig Dispense Refill  . aspirin 81 MG EC tablet Take 81 mg by mouth daily.      . bisacodyl (DULCOLAX) 5 MG EC tablet Take 5 mg by mouth. Dulcolax 5 mg bowel prep #4-Take as directed    . hydrochlorothiazide (HYDRODIURIL) 25 MG tablet TAKE 1 TABLET (40 MG TOTAL) BY MOUTH DAILY. 90 tablet 1  . NON FORMULARY Alive Vitamins-Take one daily    . pantoprazole (PROTONIX) 40 MG tablet Take 1 tablet (40 mg total) by mouth daily. 90 tablet 3  . Polyethylene Glycol 3350 (MIRALAX PO) Take by mouth. Miralax 238 gm bowel prep-Take as directed    . pravastatin (PRAVACHOL) 40 MG tablet Take 1 tablet (40 mg total) by mouth daily. 90 tablet 1   Current Facility-Administered Medications on File Prior to Visit  Medication Dose Route Frequency Provider Last Rate Last Dose  . 0.9 %  sodium chloride infusion  500 mL Intravenous Once Gatha Mayer, MD        Estimated energy needs:1200-1400 calories for weight loss, 1800+/-100 for weight maintenance.   Progress Towards Goal(s):  Some progress.   Nutritional Diagnosis:  NB-1.1 Food and nutrition-related knowledge deficit As related to lack of prior meal planning training continues to improving  As evidenced by her report, questions keeping food records, weight loss and increasing her activity.     Intervention:  Nutrition education about Medicare prediabetes program, her progress and tips on maintaining progress and weight loss.    Coordination of care: Reminded her of doctor visit due in August, refer to MDPP program  Teaching Method Utilized: Visual,Auditory, Hands on Handouts given during visit include: avs, 2 weeks of food and activity records Barriers to learning/adherence to lifestyle change: competing values Demonstrated degree of understanding via:  Teach Back   Monitoring/Evaluation:  Dietary intake, exercise, and body weight in 2 week(s). Vs start the DPP  [name], RD 11/25/2017 11:13 AM.

## 2017-11-25 NOTE — Progress Notes (Signed)
Referral request for Medicare Diabetes Prevention Program class at [facility] per patient request.

## 2017-11-25 NOTE — Patient Instructions (Signed)
I will refer you to the Medicare diabetes prevention program at the [facility]- [place]- the [facility] [phone]  You have lost a total of 10#. This is a milestone!   Great job getting back on track.   Keep up the exercise  30 minutes daily and Zumba on Saturday. Goal is to get at least and hopefully more than 150 minutes a week of exercise.

## 2017-11-29 ENCOUNTER — Encounter: Payer: Self-pay | Admitting: *Deleted

## 2017-12-05 ENCOUNTER — Telehealth: Payer: Self-pay | Admitting: *Deleted

## 2017-12-05 NOTE — Telephone Encounter (Signed)
LEFT VOICE MESSAGE FOR [facility] REGARDING APPOINTMENT FOR THIS PATIENT. UNABLE TO TELL IF PATIENT HAS BEEN CONTACTED FOR APPOINTMENT.

## 2017-12-09 ENCOUNTER — Ambulatory Visit: Payer: Medicare Other | Admitting: Dietician

## 2017-12-09 ENCOUNTER — Encounter: Payer: Self-pay | Admitting: Dietician

## 2017-12-09 NOTE — Progress Notes (Signed)
  Medical Nutrition Therapy:  Appt start time: 0900 end time:  0955. Visit # 8  Assessment:  Primary concerns today: weight loss and preventing diabetes.  Ms. Rochon brought 2 weeks of completed food and activity records. Her weight with little change. She reports a problem of craving sweets.  Her calorie average was ~1085 & 1270 calories/day. Her activity now includes walking 3-6 days a week, and Zumba x1 say a week. She is waiting for a new Medicare Diabetes Prevention group to start.   Her food records show average caloric intakes of 1073 and 1085 per day. This is very low, ask her to be sure she is including all and then think about increasing her activity.   Estimated body mass index is 47.39 kg/m as calculated from the following:   Height as of 11/11/17: 5\' 5"  (1.651 m).   Weight as of this encounter: 284 lb 12.8 oz (129.2 kg).  Goal weight ~ 270#  Wt Readings from Last 3 Encounters:  12/09/17 284 lb 12.8 oz (129.2 kg)  11/25/17 283 lb 12.8 oz (128.7 kg)  11/11/17 294 lb (133.4 kg)   Estimated energy needs:1200-1400 calories for weight loss, 1800+/-100 for weight maintenance.   Progress Towards Goal(s):  Some progress.   Nutritional Diagnosis:  NB-1.1 Food and nutrition-related knowledge deficit As related to lack of prior meal planning training continues to improve As evidenced by her report, questions keeping food records, weight loss and increasing her activity.     Intervention:  Nutrition education about her progress, how we make food choices and explored how to address her cravings, recommended fiber intake, tips on maintaining progress and weight loss.    Coordination of care: none Teaching Method Utilized: Visual,Auditory, Hands on Handouts given during visit include: avs, 2 weeks of food and activity records, tips on weight loss and maintenance Barriers to learning/adherence to lifestyle change: competing values Demonstrated degree of understanding via:  Teach Back    Monitoring/Evaluation:  Dietary intake, exercise, and body weight in 2 week(s). Vs start the Medicare Diabetes Prevention P rogram Debera Lat, RD 12/09/2017 12:02 PM.

## 2017-12-09 NOTE — Patient Instructions (Addendum)
High-Fiber Diet Fiber, also called dietary fiber, is a type of carbohydrate found in fruits, vegetables, whole grains, and beans. A high-fiber diet can have many health benefits. Your health care provider may recommend a high-fiber diet to help:  Prevent constipation. Fiber can make your bowel movements more regular.  Lower your cholesterol.  Relieve hemorrhoids, uncomplicated diverticulosis, or irritable bowel syndrome.  Prevent overeating as part of a weight-loss plan.  Prevent heart disease, type 2 diabetes, and certain cancers.  What is my plan? The recommended daily intake of fiber includes:  21-28 grams for women over age 65.  You can get the recommended daily intake of dietary fiber by eating a variety of fruits, vegetables, grains, and beans. Your health care provider may also recommend a fiber supplement if it is not possible to get enough fiber through your diet. What do I need to know about a high-fiber diet?  Fiber supplements have not been widely studied for their effectiveness, so it is better to get fiber through food sources.  Always check the fiber content on thenutrition facts label of any prepackaged food. Look for foods that contain at least 5 grams of fiber per serving.  Ask your dietitian if you have questions about specific foods that are related to your condition, especially if those foods are not listed in the following section.  Increase your daily fiber consumption gradually. Increasing your intake of dietary fiber too quickly may cause bloating, cramping, or gas.  Drink plenty of water. Water helps you to digest fiber. What foods can I eat? Grains Whole-grain breads. Multigrain cereal. Oats and oatmeal. Brown rice. Barley. Bulgur wheat. [place]. Bran muffins. Popcorn. Rye wafer crackers. Vegetables Sweet potatoes. Spinach. Kale. Artichokes. Cabbage. Broccoli. Green peas. Carrots. Squash. Fruits Berries. Pears. Apples. Oranges. Avocados. Prunes and  raisins. Dried figs. Meats and Other Protein Sources [place], kidney, pinto, and soy beans. Split peas. Lentils. Nuts and seeds. Dairy Fiber-fortified yogurt. Beverages Fiber-fortified soy milk. Fiber-fortified orange juice. Other Fiber bars. The items listed above may not be a complete list of recommended foods or beverages. Contact your dietitian for more options.  What foods are not recommended? Grains White bread. Pasta made with refined flour. White rice. Vegetables Fried potatoes. Canned vegetables. Well-cooked vegetables. Fruits Fruit juice. Cooked, strained fruit. Meats and Other Protein Sources Fatty cuts of meat. Fried Sales executive or fried fish. Dairy Milk. Yogurt. Cream cheese. Sour cream. Beverages Soft drinks. Other Cakes and pastries. Butter and oils. The items listed above may not be a complete list of foods and beverages to avoid. Contact your dietitian for more information.  What are some tips for including high-fiber foods in my diet?  Eat a wide variety of high-fiber foods.  Make sure that half of all grains consumed each day are whole grains.  Replace breads and cereals made from refined flour or white flour with whole-grain breads and cereals.  Replace white rice with brown rice, bulgur wheat, or millet.  Start the day with a breakfast that is high in fiber, such as a cereal that contains at least 5 grams of fiber per serving.  Use beans in place of meat in soups, salads, or pasta.  Eat high-fiber snacks, such as berries, raw vegetables, nuts, or popcorn.  Please keep track of fiber for at least 3 different days to see where you are for your average fiber intake.   Keep tracking your food/calorie and activity intake.  Be sure to measure for accurate portions and calories.

## 2017-12-23 ENCOUNTER — Ambulatory Visit: Payer: Medicare Other | Admitting: Dietician

## 2017-12-23 ENCOUNTER — Encounter: Payer: Self-pay | Admitting: Dietician

## 2017-12-23 DIAGNOSIS — R7303 Prediabetes: Secondary | ICD-10-CM

## 2017-12-23 NOTE — Progress Notes (Signed)
  Medical Nutrition Therapy:  Appt start time: 0900 end time:  0955. Visit # 9  Assessment:  Primary concerns today: weight loss and preventing diabetes.  Ms. Rockers brought 1 week of completed food and activity records. Her weight is increased. She reports a problem of craving sweets.  Her recorded calorie average was ~1200-1300/day. Her activity is walking 30-60 minutes daily. She is waiting for a new Medicare Diabetes Prevention group to start.   Estimated body mass index is 47.66 kg/m as calculated from the following:   Height as of 11/11/17: 5\' 5"  (1.651 m).   Weight as of this encounter: 286 lb 6.4 oz (129.9 kg).  Goal weight ~ 270#  Wt Readings from Last 3 Encounters:  12/23/17 286 lb 6.4 oz (129.9 kg)  12/09/17 284 lb 12.8 oz (129.2 kg)  11/25/17 283 lb 12.8 oz (128.7 kg)   Estimated energy needs:1200-1400 calories for weight loss, 1800+/-100 for weight maintenance.   Progress Towards Goal(s):  No progress.   Nutritional Diagnosis:  NB-1.1 Food and nutrition-related knowledge deficit As related to lack of prior meal planning training continues to improve As evidenced by her report, questions keeping food records, weight loss and increasing her activity.     Intervention:  Nutrition education about her progress, how we make food choices and explored how to feelings and willpower affect food choices.    Coordination of care: none Teaching Method Utilized: Visual,Auditory, Hands on Handouts given during visit include: avs, 2 weeks of food and activity records, tips on weight loss and maintenance Barriers to learning/adherence to lifestyle change: competing values Demonstrated degree of understanding via:  Teach Back   Monitoring/Evaluation:  Dietary intake, exercise, and body weight in 2 week(s). Vs start the Medicare Diabetes Prevention P rogram Debera Lat, RD 12/23/2017 10:15 AM.

## 2017-12-23 NOTE — Patient Instructions (Addendum)
Nutrition and Diabetes Prescription:    My goal for the next few weeks is to: take test at [email or web address]  I want to do this because: _____________________________________  I will take the following steps to achieve my goal:__________________  ________________________________________________________    I will call Butch Penny Plyler @ [phone] if I have questions or you are starting diabetes prevention classes..  My next appointment with Debera Lat will be:  Aug 6 at 9:15 AM

## 2018-01-06 ENCOUNTER — Ambulatory Visit: Payer: Medicare Other | Admitting: Dietician

## 2018-01-06 NOTE — Progress Notes (Signed)
  Medical Nutrition Therapy:  Appt start time: 0900 end time:  935. Visit # 10  Assessment:  Primary concerns today: weight loss and preventing diabetes.  Ms. Bick brought 1 week of completed food and activity records. Her weight is decreased 12# overall. She reports less of her craving for sweets.  Her recorded calorie average was ~1100-1300/day.  Her activity is walking 30-60 minutes daily. She is waiting for a new Medicare Diabetes Prevention group to start.   Estimated body mass index is 46.93 kg/m as calculated from the following:   Height as of 11/11/17: 5\' 5"  (1.651 m).   Weight as of this encounter: 282 lb (127.9 kg).  Goal weight ~ 270#  Wt Readings from Last 5 Encounters:  01/06/18 282 lb (127.9 kg)  12/23/17 286 lb 6.4 oz (129.9 kg)  12/09/17 284 lb 12.8 oz (129.2 kg)  11/25/17 283 lb 12.8 oz (128.7 kg)  11/11/17 294 lb (133.4 kg)   Estimated energy needs:1200-1400 calories for weight loss, 1800+/-100 for weight maintenance.   Progress Towards Goal(s):  No progress.   Nutritional Diagnosis:  NB-1.1 Food and nutrition-related knowledge deficit As related to lack of prior meal planning training continues to improve As evidenced by her report, questions keeping food records, weight loss and increasing her activity.     Intervention:  Nutrition education about her progress, how we make food choices and explored how to feelings and willpower affect food choices.    Coordination of care: working with [facility] to enroll her into Med. Diabetes Prev. Program Teaching Method Utilized: Visual,Auditory, Hands on Handouts given during visit include: avs, 2 weeks of food and activity records, tips on weight loss and maintenance Barriers to learning/adherence to lifestyle change: competing values Demonstrated degree of understanding via:  Teach Back   Monitoring/Evaluation:  Dietary intake, exercise, and body weight in 3 week(s). Vs start the Medicare Diabetes  Prevention P rogram Debera Lat, RD 01/06/2018 10:13 AM.

## 2018-01-06 NOTE — Patient Instructions (Addendum)
1- call [facility] to find out about class- [phone]  2-  My goal for the next few weeks is to: take test at [email or web address]  I want to do this because: _____________________________________  I will take the following steps to achieve my goal:__________________  ________________________________________________________    I will call Butch Penny Plyler @ [phone] if I have questions or you are starting diabetes prevention classes..  My next appointment with Debera Lat will be:  AM   Evaluate your food records for servings of whole grain, fruit, vegetables etc.   Eat more... 1. Whole grains: at least 2-3 servings a day- whole wheat cereal, crackers, bread, pasta, old fashioned oats & brown rice 2. Whole fruits-2-4 servings a day 3. Vegetables- 4-6 servings a day 4. Lowfat Protein: Chicken, [place], lean cuts of beef and pork, fish 2-3 times a week 5. Nuts, Seeds and Soy:add walnuts to cereal, peanut butter sandwich  Eat Less... 1. Saturated & Transfats- mostly from red meat, high fat dairy like milk, ice cream, coffee creamer, snack foods like chips, pork rind, fried foods 2. Added Sugar: artifical sweeteners can help 3. Sodium:Use spices instead of salt, avoid salty foods like soup, crackers, chips, lunch meat, sausage, hot dogs  Avoid.... . High-fructose corn syrup and "other sugars" . Partially hydrogenated vegetable oils . Trans fatty acids . Nondairy coffee creamer Of course, too much of almost any food.

## 2018-01-27 NOTE — Progress Notes (Signed)
   CC: right groin discomfort, right leg limp, health maintenance   HPI:  [name] is a 67 y.o. with a PMHx of HTN, HLD, prediabetes and benign colonic polyps presenting for a healthcare maintenance. She did complain of one of her legs feeling shorter than the other and difficulty ambulating long distances due to right groin discomfort.  For details of today's visit and the status of her chronic medical issues please refer to the assessment and plan.     Past Medical History:  Diagnosis Date  . Acanthosis nigricans   . Allergic rhinitis   . Asthma    in past  . Colonic polyp    Hyperplastic, no adenomatous or malignant changes.   . Elevated LFTs    Resolved- etiology unclear hep B, Hep C serology neg 2005  . Endometrial polyp    bemogm 12/06  . Gastric ulcer 02/20/2013  . Gastric ulcer with hemorrhage 02/20/2013  . Hx of abnormal cervical Pap smear    1980 normal in 2006.   Marland Kitchen Hyperlipidemia   . Hypertension   . Iron deficiency anemia    postmenopausal bleeding followed by Dr. Kalman Shan.  Hx of benign endometrial polyp.   . Lumbar back pain   . Obesity    morbid obesity  . Premenopausal menorrhagia   . Right knee pain    Review of Systems:    Review of Systems  Constitutional: Positive for weight loss. Negative for chills and fever.  Respiratory: Negative for cough and shortness of breath.   Cardiovascular: Negative for chest pain, orthopnea and leg swelling.  Gastrointestinal: Negative for abdominal pain, nausea and vomiting.  Musculoskeletal:       Right groin discomfort  Neurological: Negative for dizziness, tingling and headaches.    Physical Exam:  Vitals:   01/28/18 1329 01/28/18 1411  BP: (!) 151/48 (!) 142/53  Pulse: 74 65  Temp: 99.3 F (37.4 C)   TempSrc: Oral   SpO2: 98%   Weight: 279 lb 12.8 oz (126.9 kg)   Height: 5\' 5"  (1.651 m)    Physical Exam  Constitutional: She is oriented to person, place, and time and well-developed,  well-nourished, and in no distress.  Cardiovascular: Normal rate, regular rhythm and normal heart sounds.  No murmur heard. Pulmonary/Chest: Effort normal and breath sounds normal. No respiratory distress. She has no wheezes. She has no rales.  Musculoskeletal: She exhibits no edema.  Mild Right leg limp on ambulation  Neurological: She is alert and oriented to person, place, and time.  Skin: Skin is warm and dry.    Assessment & Plan:   See Encounters Tab for problem based charting.  Patient seen with Dr. Lynnae January

## 2018-01-28 ENCOUNTER — Ambulatory Visit (INDEPENDENT_AMBULATORY_CARE_PROVIDER_SITE_OTHER): Payer: Medicare Other | Admitting: Internal Medicine

## 2018-01-28 ENCOUNTER — Encounter: Payer: Self-pay | Admitting: Internal Medicine

## 2018-01-28 ENCOUNTER — Other Ambulatory Visit: Payer: Self-pay

## 2018-01-28 VITALS — BP 142/53 | HR 65 | Temp 99.3°F | Ht 65.0 in | Wt 279.8 lb

## 2018-01-28 DIAGNOSIS — Z0000 Encounter for general adult medical examination without abnormal findings: Secondary | ICD-10-CM

## 2018-01-28 DIAGNOSIS — Z23 Encounter for immunization: Secondary | ICD-10-CM | POA: Diagnosis not present

## 2018-01-28 DIAGNOSIS — R1031 Right lower quadrant pain: Secondary | ICD-10-CM | POA: Diagnosis not present

## 2018-01-28 DIAGNOSIS — I10 Essential (primary) hypertension: Secondary | ICD-10-CM

## 2018-01-28 DIAGNOSIS — R7303 Prediabetes: Secondary | ICD-10-CM | POA: Diagnosis not present

## 2018-01-28 DIAGNOSIS — E785 Hyperlipidemia, unspecified: Secondary | ICD-10-CM | POA: Diagnosis not present

## 2018-01-28 DIAGNOSIS — Z6841 Body Mass Index (BMI) 40.0 and over, adult: Secondary | ICD-10-CM

## 2018-01-28 DIAGNOSIS — Z13820 Encounter for screening for osteoporosis: Secondary | ICD-10-CM

## 2018-01-28 DIAGNOSIS — R634 Abnormal weight loss: Secondary | ICD-10-CM

## 2018-01-28 DIAGNOSIS — Z79899 Other long term (current) drug therapy: Secondary | ICD-10-CM

## 2018-01-28 DIAGNOSIS — D129 Benign neoplasm of anus and anal canal: Secondary | ICD-10-CM

## 2018-01-28 LAB — BASIC METABOLIC PANEL
Anion gap: 10 (ref 5–15)
BUN: 13 mg/dL (ref 8–23)
CO2: 30 mmol/L (ref 22–32)
Calcium: 9.3 mg/dL (ref 8.9–10.3)
Chloride: 102 mmol/L (ref 98–111)
Creatinine, Ser: 1.07 mg/dL — ABNORMAL HIGH (ref 0.44–1.00)
GFR calc Af Amer: >60 mL/min (ref >60)
GFR, EST NON AFRICAN AMERICAN: 53 mL/min — AB (ref >60)
Glucose, Bld: 104 mg/dL — ABNORMAL HIGH (ref 70–99)
Potassium: 3.3 mmol/L — ABNORMAL LOW (ref 3.5–5.1)
Sodium: 142 mmol/L (ref 135–145)

## 2018-01-28 MED ORDER — PRAVASTATIN SODIUM 40 MG PO TABS: 40.0000 mg | ORAL_TABLET | Freq: Every day | ORAL | 1 refills | Status: DC

## 2018-01-28 MED ORDER — HYDROCHLOROTHIAZIDE 25 MG PO TABS: ORAL_TABLET | ORAL | 1 refills | Status: DC

## 2018-01-28 NOTE — Patient Instructions (Signed)
[name],  Congratulations again with all the progress you've made with your weight loss! Keep up the good work! I have put in the orders for your DEXA scan and you can schedule that at the same time as your mammogram.  Plan to follow up with me in six months.  Thanks for allowing me to be a part of your care!

## 2018-01-28 NOTE — Assessment & Plan Note (Signed)
Patient reported she felt one leg was shorter than the other and a right leg limp she has noticed going on for the past few years. She denied pain or swelling. She described it as a "different" feeling that radiates down her groin when she ambulates. She is able to walk for 15-30 min usually without any issues. On ambulation there was a mild right leg limp/drag. She has no history of arthritis. Recommended a hip xray if symptoms worsen. Likely OA.  -consider right hip xray

## 2018-01-28 NOTE — Assessment & Plan Note (Addendum)
Patient reported her blood pressures have been well controlled on 25 mg HCTZ. She has a history of white coat hypertension. Repeat BP decreased from 151/48 to 143/53.   -continue HCTZ 25 mg

## 2018-01-28 NOTE — Assessment & Plan Note (Addendum)
Patient reported significant diet and exercise changes that have resulted in remarkable weight loss since April. She is working closely with Ms. Butch Penny on nutrition and diet and very motivated to reach a target goal of 270# and then hopes to reach a lower goal down the road. She reported eating more fruits and vegetables and continuing to stay active with a food journal.   - continue diet and exercise  - continue working with nutritionist  - f/u Hgb A1c

## 2018-01-28 NOTE — Assessment & Plan Note (Addendum)
Patient due for HIV screening, PNA vaccine, DEXA scan and pap smear. She denied HIV screening and given her history of normal pap smears, most recently in 2016 it is safe to discontinue pap smear screening. She is amenable to PNA vaccine, DEXA scan and flu vaccine. She is due for her mammogram in November and will get her dexa scan done at the same time. Annual labs were also drawn.  -flu vaccine -PNA 13 vaccine -dexa scan ordered -mammogram in 04/2018 -f/u Hgb A1c, CBC, BMP and lipid panel

## 2018-01-29 ENCOUNTER — Encounter: Payer: Self-pay | Admitting: Dietician

## 2018-01-29 ENCOUNTER — Ambulatory Visit: Payer: Medicare Other | Admitting: Dietician

## 2018-01-29 DIAGNOSIS — R7303 Prediabetes: Secondary | ICD-10-CM

## 2018-01-29 LAB — LIPID PANEL
CHOL/HDL RATIO: 3.3 ratio (ref 0.0–4.4)
Cholesterol, Total: 205 mg/dL — ABNORMAL HIGH (ref 100–199)
HDL: 63 mg/dL (ref >39)
LDL Calculated: 125 mg/dL — ABNORMAL HIGH (ref 0–99)
Triglycerides: 87 mg/dL (ref 0–149)
VLDL CHOLESTEROL CAL: 17 mg/dL (ref 5–40)

## 2018-01-29 LAB — CBC
HEMATOCRIT: 38.4 % (ref 34.0–46.6)
HEMOGLOBIN: 12.6 g/dL (ref 11.1–15.9)
MCH: 28.5 pg (ref 26.6–33.0)
MCHC: 32.8 g/dL (ref 31.5–35.7)
MCV: 87 fL (ref 79–97)
Platelets: 292 10*3/uL (ref 150–450)
RBC: 4.42 x10E6/uL (ref 3.77–5.28)
RDW: 15 % (ref 12.3–15.4)
WBC: 7.2 10*3/uL (ref 3.4–10.8)

## 2018-01-29 LAB — HEMOGLOBIN A1C
Est. average glucose Bld gHb Est-mCnc: 128 mg/dL
HEMOGLOBIN A1C: 6.1 % — AB (ref 4.8–5.6)

## 2018-01-29 NOTE — Progress Notes (Signed)
  Medical Nutrition Therapy:  Appt start time: 0918 end time:  161. Visit # 10  Assessment:  Primary concerns today: weight loss and preventing diabetes.  Ms. Cookston brought 2 weeks of completed food and activity records. She continues with weight loss, but not at her goal. She called and found out that she is on the wait list for the Medicare Diabetes Prevention program. She took the self compassion test.  Her recorded calorie average was ~1100-1300/day.  Her activity is walking 140-200 minutes weekly. New labs noted without change.  She has not reached 7% weight loss. (for her 7% = 273#) Estimated body mass index is 46.63 kg/m as calculated from the following:   Height as of 01/28/18: 5\' 5"  (1.651 m).   Weight as of this encounter: 280 lb 3.2 oz (127.1 kg).  Goal weight ~ 270#  Wt Readings from Last 5 Encounters:  01/29/18 280 lb 3.2 oz (127.1 kg)  01/28/18 279 lb 12.8 oz (126.9 kg)  01/06/18 282 lb (127.9 kg)  12/23/17 286 lb 6.4 oz (129.9 kg)  12/09/17 284 lb 12.8 oz (129.2 kg)   Estimated energy needs:1200-1400 calories for weight loss, 1800+/-100 for weight maintenance.   Progress Towards Goal(s):  No progress.   Nutritional Diagnosis:  NB-1.1 Food and nutrition-related knowledge deficit As related to lack of prior meal planning training for weight loss continues to improve As evidenced by her report, questions keeping good food records, weight loss and increasing her activity.     Intervention:  Nutrition education about her progress, how to evaluate her foods records, and how to handle constipation.    Coordination of care: working with [facility] to enroll her into Med. Diabetes Prev. Program Teaching Method Utilized: Visual,Auditory, Hands on Handouts given during visit include: avs, 2 weeks of food and activity records Barriers to learning/adherence to lifestyle change: competing values Demonstrated degree of understanding via:  Teach Back    Monitoring/Evaluation:  Dietary intake, exercise, and body weight in 2 week(s). Vs start the Medicare Diabetes Prevention P rogram Debera Lat, RD 01/29/2018 9:20 AM.

## 2018-01-29 NOTE — Patient Instructions (Addendum)
Website for diabetes prevention program: [email or web address].[email or web address]  For review of how to handle constipation:  o Drink 64 fl oz or more of water and other low-calorie (10 calories or less per 8 fl oz) beverages o You can add 1-3 teaspoons per day of Metamucil(can by store brand Psyllium husk or chia seeds)  or Benefiber, 1- 2 teaspoon at a time, to any fluids you choose, increase per directions o Add chia seeds or freshly ground flax seeds to yogurt or oatmeal o Eat some prunes, can have a little prune juice o Drink some Smooth Move tea, available at most store on the tea aisle o Ca try a magnesium supplement daily as it relaxes smooth muscles and aids in easier bowel movements. Most of [place] are not getting the recommended amount. I recommend an additional 250 mg of magnesium per day (if you are not getting a higher dose from a different supplement already) o Eat as much leafy green vegetables as possible (spinach, kale, collards, turnip and mustard greens); they provided a lot of fiber, and are a good source of magnesium as well. Add kale or spinach to smoothies with frozen berries (more fiber) if you do not like to eat greens. o Other magnesium-rich foods are: legumes (beans and lentils - also great sources of fiber), nuts like almonds and cashews, avocado, yogurt, and milk. o Over time try to increase intake of vegetables and fruits to 1 or more cups each per day. This will probably take 6+ months.

## 2018-01-30 NOTE — Progress Notes (Signed)
Internal Medicine Clinic Attending  I saw and evaluated the patient.  I personally confirmed the key portions of the history and exam documented by Dr.  Rehman  and I reviewed pertinent patient test results.  The assessment, diagnosis, and plan were formulated together and I agree with the documentation in the resident's note.  

## 2018-02-11 ENCOUNTER — Ambulatory Visit: Payer: Medicare Other | Admitting: Dietician

## 2018-02-11 ENCOUNTER — Encounter: Payer: Self-pay | Admitting: Dietician

## 2018-02-11 VITALS — Wt 279.4 lb

## 2018-02-11 DIAGNOSIS — R972 Elevated prostate specific antigen [PSA]: Secondary | ICD-10-CM

## 2018-02-11 DIAGNOSIS — R7303 Prediabetes: Secondary | ICD-10-CM

## 2018-02-11 NOTE — Patient Instructions (Signed)
Good job keeping up with food and activity records!  Remember to try to get at least 2 servings of fruit each day and 3 servings of vegetables. ( vitamins, minerals and fiber)  Whole grains help with fiber and vitamin mineral too!   See you in 2 weeks!

## 2018-02-11 NOTE — Progress Notes (Signed)
  Diabetes Prevention Visit:  Appt start time: 0905 end time:  925  Assessment:  Primary concerns today: weight loss and preventing diabetes.  Ms. Beshears brought 2 weeks of completed food and activity records.  Weight loss, but not at her goal.  Medicare Diabetes Prevention program: still waiting Constipation: "whole lot better due to the foods with fiber I am eating"  Estimated body mass index is 46.49 kg/m as calculated from the following:   Height as of 01/28/18: 5\' 5"  (1.651 m).   Weight as of this encounter: 279 lb 6.4 oz (126.7 kg).  Goal weight ~ 270#  Wt Readings from Last 5 Encounters:  02/11/18 279 lb 6.4 oz (126.7 kg)  01/29/18 280 lb 3.2 oz (127.1 kg)  01/28/18 279 lb 12.8 oz (126.9 kg)  01/06/18 282 lb (127.9 kg)  12/23/17 286 lb 6.4 oz (129.9 kg)   Estimated energy needs:1200-1400 calories for weight loss, 1800+/-100 for weight maintenance.   Progress Towards Goal(s):  Some progress.  Monitoring/Evaluation:  Dietary intake, exercise, and body weight in 2 week(s). Vs start the Medicare Diabetes Prevention P rogram Debera Lat, RD 02/11/2018 9:16 AM.

## 2018-02-16 ENCOUNTER — Telehealth: Payer: Self-pay | Admitting: Internal Medicine

## 2018-02-16 ENCOUNTER — Other Ambulatory Visit: Payer: Self-pay | Admitting: Internal Medicine

## 2018-02-16 NOTE — Telephone Encounter (Signed)
Diagnosis was changed to screening for osteoporosis. Thanks

## 2018-02-16 NOTE — Telephone Encounter (Signed)
Rec'd phone call from the [facility] asking for a corrected order.  Unable to process current diagnosis code per insurance for the Bone Density requested.  Please change diagnosis as they can not accept the current one.  If any questions please call their office @ [phone].

## 2018-02-16 NOTE — Addendum Note (Signed)
Addended by: Mike Craze on: 02/16/2018 10:33 PM   Modules accepted: Orders

## 2018-02-17 ENCOUNTER — Other Ambulatory Visit: Payer: Self-pay | Admitting: Internal Medicine

## 2018-02-17 DIAGNOSIS — E2839 Other primary ovarian failure: Secondary | ICD-10-CM

## 2018-02-17 DIAGNOSIS — Z13820 Encounter for screening for osteoporosis: Secondary | ICD-10-CM

## 2018-02-17 DIAGNOSIS — Z6841 Body Mass Index (BMI) 40.0 and over, adult: Secondary | ICD-10-CM

## 2018-02-25 ENCOUNTER — Ambulatory Visit: Payer: Medicare Other | Admitting: Dietician

## 2018-02-25 ENCOUNTER — Encounter: Payer: Self-pay | Admitting: Dietician

## 2018-02-25 DIAGNOSIS — R7303 Prediabetes: Secondary | ICD-10-CM

## 2018-02-25 NOTE — Progress Notes (Signed)
  Diabetes Prevention Visit:  Appt start time: 0905 end time:  925  Assessment:  Primary concerns today: weight loss and preventing diabetes.  Ms. Cannady brought 2 weeks of completed food and activity records. Is struggling to try eat the foods she knows she needs and to limit higher calorie snacks. Trying to track calories, protein, fiber and fat.  Medicare Diabetes Prevention program: still waiting  Estimated body mass index is 46.76 kg/m as calculated from the following:   Height as of 01/28/18: 5\' 5"  (1.651 m).   Weight as of this encounter: 281 lb (127.5 kg).  Goal weight ~ 270#  Wt Readings from Last 5 Encounters:  02/25/18 281 lb (127.5 kg)  02/11/18 279 lb 6.4 oz (126.7 kg)  01/29/18 280 lb 3.2 oz (127.1 kg)  01/28/18 279 lb 12.8 oz (126.9 kg)  01/06/18 282 lb (127.9 kg)   Estimated energy needs:1200-1400 calories for weight loss, 1800+/-100 for weight maintenance.   Progress Towards Goal(s):  Some progress.  Monitoring/Evaluation:  Dietary intake, exercise, and body weight in 2 week(s). vs start the Medicare Diabetes Prevention P rogram Debera Lat, RD 02/25/2018 11:53 AM.

## 2018-02-25 NOTE — Patient Instructions (Signed)
Try to think about what you need to meet your nutrition requirements (on sheet Helping me to eat healthy)  when making a food decision.  Can keep up with what you have had on those sheets to help you make food decisions...  Good job! Keeping on keeping up!   Keep up on the Zumba and walking, consider the [place] on [address].

## 2018-03-11 ENCOUNTER — Ambulatory Visit: Payer: Medicare Other | Admitting: Dietician

## 2018-03-11 ENCOUNTER — Encounter: Payer: Self-pay | Admitting: Dietician

## 2018-03-11 DIAGNOSIS — R7303 Prediabetes: Secondary | ICD-10-CM

## 2018-03-11 NOTE — Progress Notes (Signed)
  Diabetes Prevention Visit:  Appt start time: 0915 end time:  945  Assessment:  Primary concerns today: weight loss/preventing diabetes.  Ms. Minella brought 3 weeks of completed food and activity records.She continues to struggle with trying to eat more fruits and vegetables than sweets.  Medicare Diabetes Prevention program: to start Nov 4 at 11 AM  Estimated body mass index is 46.54 kg/m as calculated from the following:   Height as of 01/28/18: 5\' 5"  (1.651 m).   Weight as of this encounter: 279 lb 11.2 oz (126.9 kg).  Goal weight ~ 270#  Wt Readings from Last 5 Encounters:  03/11/18 279 lb 11.2 oz (126.9 kg)  02/25/18 281 lb (127.5 kg)  02/11/18 279 lb 6.4 oz (126.7 kg)  01/29/18 280 lb 3.2 oz (127.1 kg)  01/28/18 279 lb 12.8 oz (126.9 kg)   Estimated energy needs:1200-1400 calories for weight loss, 1800+/-100 for weight maintenance.   Progress Towards Goal(s):  Some progress.  Monitoring/Evaluation:  Dietary intake, exercise, and body weight in 2 week(s).  Debera Lat, RD 03/11/2018 10:10 AM.

## 2018-03-11 NOTE — Patient Instructions (Signed)
You said you are going to try to maintain your food and activity records   AND  Try to talk to your kids about bringing sweets into the house- ? Ask them to pick up fruit instead.   Over whelm them with fruits and vegetables  MEDJOOL dates- super sweet/ Pnieapple ? Frozen fruits

## 2018-03-25 ENCOUNTER — Ambulatory Visit: Payer: Medicare Other | Admitting: Dietician

## 2018-03-25 DIAGNOSIS — R7303 Prediabetes: Secondary | ICD-10-CM

## 2018-03-25 DIAGNOSIS — Z6841 Body Mass Index (BMI) 40.0 and over, adult: Secondary | ICD-10-CM

## 2018-03-25 NOTE — Progress Notes (Signed)
  Diabetes Prevention Visit:  Appt start time: 0815 end time:  845  Assessment:  Primary concerns today: weight loss/preventing diabetes.  Ms. Easton brought 3 weeks of completed food and activity records.She continues to struggle with trying to eat less "junk food" and more fruits and vegetables. Her weight has plateaued.   Medicare Diabetes Prevention program: to start Nov 4 at 11 AM  Estimated body mass index is 46.86 kg/m as calculated from the following:   Height as of 01/28/18: 5\' 5"  (1.651 m).   Weight as of this encounter: 281 lb 9.6 oz (127.7 kg).  Goal weight ~ 270#  Wt Readings from Last 5 Encounters:  03/25/18 281 lb 9.6 oz (127.7 kg)  03/11/18 279 lb 11.2 oz (126.9 kg)  02/25/18 281 lb (127.5 kg)  02/11/18 279 lb 6.4 oz (126.7 kg)  01/29/18 280 lb 3.2 oz (127.1 kg)   Estimated energy needs:1200-1400 calories for weight loss, 1800+/-100 for weight maintenance.   Progress Towards Goal(s):  No progress with weight, progress with attitude and nutrient dense food intake.  Monitoring/Evaluation:  Dietary intake, exercise, and body weight prn.  Debera Lat, RD 03/25/2018 8:54 AM.

## 2018-03-25 NOTE — Patient Instructions (Signed)
Your A1C will be due again next August 2020  Have fun in the diabetes prevention classes.

## 2018-04-16 ENCOUNTER — Ambulatory Visit
Admission: RE | Admit: 2018-04-16 | Discharge: 2018-04-16 | Disposition: A | Discharge status: Home / Self Care | Payer: Medicare Other | Source: Ambulatory Visit | Attending: Internal Medicine | Admitting: Internal Medicine

## 2018-04-16 ENCOUNTER — Other Ambulatory Visit: Payer: Self-pay | Admitting: Internal Medicine

## 2018-04-16 ENCOUNTER — Other Ambulatory Visit: Payer: Medicare Other

## 2018-04-16 DIAGNOSIS — N6320 Unspecified lump in the left breast, unspecified quadrant: Secondary | ICD-10-CM

## 2018-04-16 DIAGNOSIS — E2839 Other primary ovarian failure: Secondary | ICD-10-CM

## 2018-04-16 DIAGNOSIS — N6489 Other specified disorders of breast: Secondary | ICD-10-CM

## 2018-04-16 DIAGNOSIS — Z780 Asymptomatic menopausal state: Secondary | ICD-10-CM | POA: Diagnosis not present

## 2018-04-16 DIAGNOSIS — R928 Other abnormal and inconclusive findings on diagnostic imaging of breast: Secondary | ICD-10-CM | POA: Diagnosis not present

## 2018-04-16 DIAGNOSIS — Z13820 Encounter for screening for osteoporosis: Secondary | ICD-10-CM | POA: Diagnosis not present

## 2018-04-29 ENCOUNTER — Telehealth: Payer: Self-pay | Admitting: *Deleted

## 2018-04-29 ENCOUNTER — Other Ambulatory Visit: Payer: Self-pay | Admitting: Internal Medicine

## 2018-04-29 DIAGNOSIS — I10 Essential (primary) hypertension: Secondary | ICD-10-CM

## 2018-04-29 MED ORDER — HYDROCHLOROTHIAZIDE 25 MG PO TABS: ORAL_TABLET | ORAL | 1 refills | Status: DC

## 2018-04-29 NOTE — Telephone Encounter (Signed)
I just corrected this and sent a new script to the pharmacy. Let me know if you need anything else.  Thanks!

## 2018-04-29 NOTE — Telephone Encounter (Signed)
Dr Laural Golden, [facility] calls and states they need clarification on HCTZ script sent 01/28/18 script reads HCTZ 25mg  take 1 tablet (40mg ) by mouth daily. Reviewed note at last visit, gave VO for 25 mg (total 25mg ) once daily. If you agree please change med list to reflect this

## 2018-05-11 ENCOUNTER — Encounter: Payer: Self-pay | Admitting: Registered"

## 2018-05-11 ENCOUNTER — Encounter: Payer: Medicare Other | Attending: Internal Medicine | Admitting: Registered"

## 2018-05-11 DIAGNOSIS — R7303 Prediabetes: Secondary | ICD-10-CM | POA: Diagnosis not present

## 2018-05-11 NOTE — Progress Notes (Signed)
Patient was seen on 05/11/18 for the Core Session 1 of Diabetes Prevention Program course at [facility]. The following learning objectives were met by the patient during this class:   Learning Objectives:   Be able to explain the purpose and benefits of the National Diabetes Prevention Program.   Be able to describe the events that will take place at every session.   Know the weight loss and physical activity goals established by the National Diabetes Prevention Program.   Know their own individual weight loss and physical activity goals.   Be able to explain the important effect of self-monitoring on behavior change.   Goals:   Record food and beverage intake in "Food and Activity Tracker" over the next week.   Bring completed "Food and Activity Tracker" for session 1 to session 2 next week.  Circle the foods or beverages you think are highest in fat and calories in your food tracker.  Read the labels on the food you buy, and consider using measuring cups and spoons to help you calculate the amount you eat. We will talk about measuring in more detail in the coming weeks.   Follow-Up Plan:  Attend Core Session 2 next week.   Bring completed "Food and Activity Tracker" next week to be reviewed by Lifestyle Coach.    

## 2018-05-18 ENCOUNTER — Encounter: Payer: Self-pay | Admitting: Registered"

## 2018-05-18 ENCOUNTER — Encounter (HOSPITAL_BASED_OUTPATIENT_CLINIC_OR_DEPARTMENT_OTHER): Payer: Medicare Other | Admitting: Registered"

## 2018-05-18 DIAGNOSIS — R7303 Prediabetes: Secondary | ICD-10-CM

## 2018-05-18 NOTE — Progress Notes (Signed)
Patient was seen on 05/18/18 for the Core Session 2 of Diabetes Prevention Program course at [facility]. By the end of this session patients are able to complete the following objectives:   Learning Objectives:  Self-monitor their weight during the weeks following Session 2.   Describe the relationship between fat and calories.   Explain the reason for, and basic principles of, self-monitoring fat grams and calories.   Identify their personal fat gram goals.   Use the ?Fat and Calorie Counter to calculate the calories and fat grams of a given selection of foods.   Keep a running total of the fat grams they eat each day.   Calculate fat, calories, and serving sizes from nutrition labels.   Goals:   Weigh yourself at the same time each day, or every few days, and record your weight in your Food and Activity Tracker.  Write down everything you eat and drink in your Food and Activity Tracker.  Measure portions as much as you can, and start reading labels.   Use the ?Fat and Calorie Counter to figure out the amount of fat and calories in what you ate, and write the amount down in your Food and Activity Tracker.  Keep a running fat gram total throughout the day. Come as close to your fat gram goal as you can.   Follow-Up Plan:  Attend Core Session 3 next week.   Bring completed "Food and Activity Tracker" next week to be reviewed by Lifestyle Coach.

## 2018-05-25 ENCOUNTER — Encounter: Payer: Self-pay | Admitting: Registered"

## 2018-05-25 ENCOUNTER — Encounter (HOSPITAL_BASED_OUTPATIENT_CLINIC_OR_DEPARTMENT_OTHER): Payer: Medicare Other | Admitting: Registered"

## 2018-05-25 DIAGNOSIS — R7303 Prediabetes: Secondary | ICD-10-CM

## 2018-05-25 NOTE — Progress Notes (Signed)
Patient was seen on 05/25/18 for the Core Session 3 of Diabetes Prevention Program course at [facility]. By the end of this session patients are able to complete the following objectives:   Learning Objectives:  Weigh and measure foods.  Estimate the fat and calorie content of common foods.  Describe three ways to eat less fat and fewer calories.  Create a plan to eat less fat for the following week.   Goals:   Track weight when weighing outside of class.   Track food and beverages eaten each day in Food and Activity Tracker and include fat grams and calories for each.   Try to stay within fat gram goal.   Complete plan for eating less high fat foods and answer related homework questions.    Follow-Up Plan:  Attend Core Session 4 next week.   Bring completed "Food and Activity Tracker" next week to be reviewed by Lifestyle Coach.

## 2018-06-01 ENCOUNTER — Encounter: Payer: Self-pay | Admitting: Registered"

## 2018-06-01 ENCOUNTER — Encounter (HOSPITAL_BASED_OUTPATIENT_CLINIC_OR_DEPARTMENT_OTHER): Payer: Medicare Other | Admitting: Registered"

## 2018-06-01 DIAGNOSIS — R7303 Prediabetes: Secondary | ICD-10-CM

## 2018-06-01 NOTE — Progress Notes (Signed)
Patient was seen on 06/01/18 for the Core Session 4 of Diabetes Prevention Program course at [facility]. By the end of this session patients are able to complete the following objectives:   Learning Objectives:  Explain the health benefits of eating less fat and fewer calories.  Describe the MyPlate food guide and its recommendations, including  how to reduce fat and calories in our diet.  Compare and contrast MyPlate guidelines with participants' eating  habits.  List ways to replace high-fat and high-calorie foods with low-fat and  low-calorie foods.  Explain the importance of eating plenty of whole grains, vegetables, and  fruits, while staying within fat gram goals.  Explain the importance of eating foods from all groups of MyPlate and  of eating a variety of foods from within each group.  Explain why a balanced diet is beneficial to health.  Explain why eating the same foods over and over is not the best strategy  for long-term success.   Goals:   Record weight taken outside of class.   Track foods and beverages eaten each day in the "Food and Activity Tracker," including calories and fat grams for each item.   Practice comparing what you eat with the recommendations of MyPlate using the "Rate Your Plate" handout.   Complete the "Rate Your Plate" handout form on at least 3 days.   Answer homework questions.   Follow-Up Plan:  Attend Core Session 5 next week.   Bring completed "Food and Activity Tracker" next week to be reviewed by Lifestyle Coach.

## 2018-06-04 ENCOUNTER — Other Ambulatory Visit: Payer: Self-pay | Admitting: Internal Medicine

## 2018-06-04 DIAGNOSIS — H52203 Unspecified astigmatism, bilateral: Secondary | ICD-10-CM | POA: Diagnosis not present

## 2018-06-04 DIAGNOSIS — H524 Presbyopia: Secondary | ICD-10-CM | POA: Diagnosis not present

## 2018-06-04 DIAGNOSIS — H5203 Hypermetropia, bilateral: Secondary | ICD-10-CM | POA: Diagnosis not present

## 2018-06-05 ENCOUNTER — Other Ambulatory Visit: Payer: Self-pay

## 2018-06-05 NOTE — Telephone Encounter (Signed)
This was sent in today, done

## 2018-06-05 NOTE — Telephone Encounter (Signed)
pantoprazole (PROTONIX) 40 MG tablet, refill request @  [facility] [address] [place] [phone] (Phone) [phone] (Fax)

## 2018-06-07 ENCOUNTER — Other Ambulatory Visit: Payer: Self-pay | Admitting: Internal Medicine

## 2018-06-08 ENCOUNTER — Encounter: Payer: Medicare Other | Attending: Internal Medicine | Admitting: Registered"

## 2018-06-08 ENCOUNTER — Encounter: Payer: Self-pay | Admitting: Registered"

## 2018-06-08 DIAGNOSIS — R7303 Prediabetes: Secondary | ICD-10-CM | POA: Insufficient documentation

## 2018-06-08 NOTE — Progress Notes (Addendum)
Patient was seen on 06/08/18 for the Core Session 5 of Diabetes Prevention Program course at [facility]. By the end of this session patients are able to complete the following objectives:   Learning Objectives:  Establish a physical activity goal.  Explain the importance of the physical activity goal.  Describe their current level of physical activity.  Name ways that they are already physically active.  Develop personal plans for physical activity for the next week.   Goals:   Record weight taken outside of class.   Track foods and beverages eaten each day in the "Food and Activity Tracker," including calories and fat grams for each item.   Make an Activity Plan including date, specific type of activity, and length of time you plan to be active that includes at last 60 minutes of activity for the week.   Track activity type, minutes you were active, and distance you reached each day in the "Food and Activity Tracker."   Follow-Up Plan:  Attend Core Session 6 next week.   Bring completed "Food and Activity Tracker" next week to be reviewed by Lifestyle Coach.

## 2018-06-09 ENCOUNTER — Telehealth: Payer: Self-pay | Admitting: *Deleted

## 2018-06-09 NOTE — Telephone Encounter (Signed)
Pt calling about Pantoprazole not being refilled Informed pt - it was refilled 06/04/18 by Dr Laural Golden and sent to [facility]. Called [facility] - stated they did not received refill rx. Verbal order given "Pantoprazole 40 mg Take 1 tab by mouth daily qty# 90 x 2 RF". Called pt back - left message of refill.

## 2018-06-15 ENCOUNTER — Encounter (HOSPITAL_BASED_OUTPATIENT_CLINIC_OR_DEPARTMENT_OTHER): Payer: Medicare Other | Admitting: Registered"

## 2018-06-15 DIAGNOSIS — R7303 Prediabetes: Secondary | ICD-10-CM | POA: Diagnosis not present

## 2018-06-15 NOTE — Progress Notes (Signed)
Patient was seen on 06/15/18  for the Core Session 6 of Diabetes Prevention Program course at [facility]. By the end of this session patients are able to complete the following objectives:   Learning Objectives:  Graph their daily physical activity.   Describe two ways of finding the time to be active.   Define "lifestyle activity."   Describe how to prevent injury.   Develop an activity plan for the coming week.   Goals:   Record weight taken outside of class.   Track foods and beverages eaten each day in the "Food and Activity Tracker," including calories and fat grams for each item.    Track activity type, minutes you were active, and distance you reached each day in the "Food and Activity Tracker."   Set aside one 20 to 30-minute block of time every day or find two or more periods of 10 to15 minutes each for physical activity.   Warm up, cool down, and stretch.  Make a Physical Activities Plan for the Week.   Follow-Up Plan:  Attend Core Session 7 next week.   Bring completed "Food and Activity Tracker" next week to be reviewed by Lifestyle Coach.

## 2018-06-22 ENCOUNTER — Encounter: Payer: Self-pay | Admitting: Registered"

## 2018-06-22 ENCOUNTER — Encounter (HOSPITAL_BASED_OUTPATIENT_CLINIC_OR_DEPARTMENT_OTHER): Payer: Medicare Other | Admitting: Registered"

## 2018-06-22 DIAGNOSIS — R7303 Prediabetes: Secondary | ICD-10-CM

## 2018-06-22 NOTE — Progress Notes (Signed)
Patient was seen on 06/22/18 for the Core Session 7 of Diabetes Prevention Program course at [facility]. By the end of this session patients are able to complete the following objectives:   Learning Objectives:  Define calorie balance.  Explain how healthy eating and being active are related in terms of calorie balance.   Describe the relationship between calorie balance and weight loss.   Describe his or her progress as it relates to calorie balance.   Develop an activity plan for the coming week.   Goals:   Record weight taken outside of class.   Track foods and beverages eaten each day in the "Food and Activity Tracker," including calories and fat grams for each item.    Track activity type, minutes you were active, and distance you reached each day in the "Food and Activity Tracker."   Set aside one 20 to 30-minute block of time every day or find two or more periods of 10 to15 minutes each for physical activity.   Make a Physical Activities Plan for the Week.   Make active lifestyle choices all through the day   Stay at or go slightly over activity goal.   Follow-Up Plan:  Attend Core Session 8 next week.   Bring completed "Food and Activity Tracker" next week to be reviewed by Lifestyle Coach.

## 2018-06-29 ENCOUNTER — Encounter: Payer: Self-pay | Admitting: Registered"

## 2018-06-29 ENCOUNTER — Encounter (HOSPITAL_BASED_OUTPATIENT_CLINIC_OR_DEPARTMENT_OTHER): Payer: Medicare Other | Admitting: Registered"

## 2018-06-29 DIAGNOSIS — R7303 Prediabetes: Secondary | ICD-10-CM | POA: Diagnosis not present

## 2018-06-29 NOTE — Progress Notes (Signed)
Patient was seen on 06/29/18 for the Core Session 8 of Diabetes Prevention Program course at [facility]. By the end of this session patients are able to complete the following objectives:   Learning Objectives:  Recognize positive and negative food and activity cues.   Change negative food and activity cues to positive cues.   Add positive cues for activity and eliminate cues for inactivity.   Develop a plan for removing one problem food cue for the coming week.   Goals:   Record weight taken outside of class.   Track foods and beverages eaten each day in the "Food and Activity Tracker," including calories and fat grams for each item.    Track activity type, minutes you were active, and distance you reached each day in the "Food and Activity Tracker."   Set aside one 20 to 30 minute block of time every day or find two or more periods of 10 to 15 minutes each for physical activity.   Remove one problem food cue.   Add one positive cue for being more active.  Follow-Up Plan:  Attend Core Session 9 next week.   Bring completed "Food and Activity Tracker" next week to be reviewed by Lifestyle Coach.

## 2018-07-06 ENCOUNTER — Encounter: Payer: Medicare Other | Attending: Internal Medicine | Admitting: Registered"

## 2018-07-06 ENCOUNTER — Encounter: Payer: Self-pay | Admitting: Registered"

## 2018-07-06 DIAGNOSIS — R7303 Prediabetes: Secondary | ICD-10-CM

## 2018-07-06 NOTE — Progress Notes (Signed)
Patient was seen on 07/06/18 for the Core Session 9 of Diabetes Prevention Program course at [facility]. By the end of this session patients are able to complete the following objectives:   Learning Objectives:  List and describe five steps to problem solving.   Apply the five problem solving steps to resolve a problem he or she has with eating less fat and fewer calories or being more active.   Goals:   Record weight taken outside of class.   Track foods and beverages eaten each day in the "Food and Activity Tracker," including calories and fat grams for each item.    Track activity type, minutes you were active, and distance you reached each day in the "Food and Activity Tracker."   Set aside one 20 to 30-minute block of time every day or find two or more periods of 10 to15 minutes each for physical activity.   Use problem solving action plan created during session to problem solve.   Follow-Up Plan:  Attend Core Session 10 next week.   Bring completed "Food and Activity Tracker" next week to be reviewed by Lifestyle Coach.  Bring menus from favorite restaurants to next session for future discussion.

## 2018-07-13 ENCOUNTER — Encounter (HOSPITAL_BASED_OUTPATIENT_CLINIC_OR_DEPARTMENT_OTHER): Payer: Medicare Other | Admitting: Registered"

## 2018-07-13 ENCOUNTER — Encounter: Payer: Self-pay | Admitting: Registered"

## 2018-07-13 DIAGNOSIS — R7303 Prediabetes: Secondary | ICD-10-CM | POA: Diagnosis not present

## 2018-07-13 NOTE — Progress Notes (Signed)
Patient was seen on 07/13/18 for the Core Session 10 of Diabetes Prevention Program course at [facility]. By the end of this session patients are able to complete the following objectives:   Learning Objectives:  List and describe the four keys for healthy eating out.   Give examples of how to apply these keys at the type of restaurants that the participants go to regularly.   Make an appropriate meal selection from a restaurant menu.   Demonstrate how to ask for a substitute item using assertive language and a polite tone of voice.    Goals:   Record weight taken outside of class.   Track foods and beverages eaten each day in the "Food and Activity Tracker," including calories and fat grams for each item.    Track activity type, minutes you were active, and distance you reached each day in the "Food and Activity Tracker."   Set aside one 20 to 30-minute block of time every day or find two or more periods of 10 to15 minutes each for physical activity.   Utilize positive action plan and complete questions on "To Do List."   Follow-Up Plan:  Attend Core Session 11 next week.   Bring completed "Food and Activity Tracker" next week to be reviewed by Lifestyle Coach.

## 2018-07-15 ENCOUNTER — Other Ambulatory Visit: Payer: Self-pay

## 2018-07-15 ENCOUNTER — Encounter: Payer: Self-pay | Admitting: Internal Medicine

## 2018-07-15 ENCOUNTER — Ambulatory Visit (INDEPENDENT_AMBULATORY_CARE_PROVIDER_SITE_OTHER): Payer: Medicare Other | Admitting: Internal Medicine

## 2018-07-15 VITALS — BP 143/69 | HR 78 | Temp 98.2°F | Ht 65.0 in | Wt 289.2 lb

## 2018-07-15 DIAGNOSIS — M79605 Pain in left leg: Secondary | ICD-10-CM | POA: Diagnosis not present

## 2018-07-15 DIAGNOSIS — E785 Hyperlipidemia, unspecified: Secondary | ICD-10-CM

## 2018-07-15 DIAGNOSIS — K219 Gastro-esophageal reflux disease without esophagitis: Secondary | ICD-10-CM

## 2018-07-15 DIAGNOSIS — I10 Essential (primary) hypertension: Secondary | ICD-10-CM | POA: Diagnosis not present

## 2018-07-15 DIAGNOSIS — Z6841 Body Mass Index (BMI) 40.0 and over, adult: Secondary | ICD-10-CM

## 2018-07-15 MED ORDER — HYDROCHLOROTHIAZIDE 25 MG PO TABS: ORAL_TABLET | ORAL | 1 refills | Status: DC

## 2018-07-15 MED ORDER — PRAVASTATIN SODIUM 40 MG PO TABS: 40.0000 mg | ORAL_TABLET | Freq: Every day | ORAL | 1 refills | Status: DC

## 2018-07-15 MED ORDER — PANTOPRAZOLE SODIUM 40 MG PO TBEC: 40.0000 mg | DELAYED_RELEASE_TABLET | Freq: Every day | ORAL | 2 refills | Status: DC

## 2018-07-15 NOTE — Assessment & Plan Note (Addendum)
Patient reported left upper leg pain for the past couple days.  She notes pain is so severe it has woken her up from sleep.  She denies any back, hip or knee pain.  Denies any weakness, numbness or tingling.  Physical activity does not make the pain worse. She states tylenol has helped alleviate the pain. She has also been working with a nutritionist and trying to be more active so felt she may have pulled a muscle. Discussed that this was most likely musculoskeletal in nature and to continue conservative measures with tylenol and icy hot.   Plan:  - continue tylenol and icy hot prn

## 2018-07-15 NOTE — Progress Notes (Signed)
Case discussed with Dr. Laural Golden at the time of the visit. We reviewed the resident's history and exam and pertinent patient test results. I agree with the assessment, diagnosis, and plan of care documented in the resident's note.

## 2018-07-15 NOTE — Patient Instructions (Signed)
Ms. Markman,  It was a pleasure seeing you today! I am so proud of your hard work with your nutritionist. As we discussed, keep a log of your blood pressures over the next couple of weeks and let me know how your blood pressure is doing. If it remains elevated we may need to start a new medication along with your hydrochlorothiazide. Otherwise we can plan to follow up in six months.  Thanks, Dr. Laural Golden

## 2018-07-15 NOTE — Assessment & Plan Note (Addendum)
BP 143/69. She states she has been taking HCTZ 25 mg daily. She also reports a history of white coat htn and that when she checks her pressures at home they are lower. Discussed possibly starting an additional medication such as lisinopril and amlodipine. Patient declined at this time and states she will keep a blood pressure log at home. She is also continuing to work on lifestyle modifications. She is working with a Engineer, maintenance (IT). If her pressures remain elevated then we will add an additional medication.  Plan: -Continue hydrochlorothiazide 25 mg daily -Patient will keep blood pressure log, if pressures remain elevated consider adding amlodipine or lisinopril

## 2018-07-15 NOTE — Progress Notes (Signed)
   CC: hypertension, leg pain  HPI:  [name] is a 68 y.o. with a PMHx listed below presenting for evaluation of her left leg pain and blood pressure. For details of today's visit and the status of his chronic medical issues please refer to the assessment and plan.   Past Medical History:  Diagnosis Date  . Acanthosis nigricans   . Allergic rhinitis   . Asthma    in past  . Colonic polyp    Hyperplastic, no adenomatous or malignant changes.   . Elevated LFTs    Resolved- etiology unclear hep B, Hep C serology neg 2005  . Endometrial polyp    bemogm 12/06  . Gastric ulcer 02/20/2013  . Gastric ulcer with hemorrhage 02/20/2013  . Hx of abnormal cervical Pap smear    1980 normal in 2006.   Marland Kitchen Hyperlipidemia   . Hypertension   . Iron deficiency anemia    postmenopausal bleeding followed by Dr. Kalman Shan.  Hx of benign endometrial polyp.   . Lumbar back pain   . Obesity    morbid obesity  . Premenopausal menorrhagia   . Right knee pain    Review of Systems:   Review of Systems  Constitutional: Negative for malaise/fatigue and weight loss.  Musculoskeletal: Positive for myalgias. Negative for back pain, falls and joint pain.  Neurological: Negative for sensory change and weakness.    Physical Exam:  Vitals:   07/15/18 1355  BP: (!) 143/69  Pulse: 78  Temp: 98.2 F (36.8 C)  SpO2: 97%  Weight: 289 lb 3.2 oz (131.2 kg)  Height: 5\' 5"  (1.651 m)   Physical Exam  Constitutional: She is oriented to person, place, and time and well-developed, well-nourished, and in no distress.  Cardiovascular: Normal rate, regular rhythm and normal heart sounds.  No murmur heard. Pulmonary/Chest: Effort normal and breath sounds normal. No respiratory distress. She has no wheezes.  Abdominal: Soft. Bowel sounds are normal. She exhibits no distension. There is no abdominal tenderness.  Musculoskeletal: Normal range of motion.        General: No tenderness or edema.  Neurological:  She is alert and oriented to person, place, and time.  Skin: Skin is warm and dry.  Psychiatric: Mood, memory, affect and judgment normal.    Assessment & Plan:   See Encounters Tab for problem based charting.  Patient discussed with Dr. Eppie Gibson

## 2018-07-20 ENCOUNTER — Encounter (HOSPITAL_BASED_OUTPATIENT_CLINIC_OR_DEPARTMENT_OTHER): Payer: Medicare Other | Admitting: Registered"

## 2018-07-20 ENCOUNTER — Encounter: Payer: Self-pay | Admitting: Registered"

## 2018-07-20 DIAGNOSIS — R7303 Prediabetes: Secondary | ICD-10-CM | POA: Diagnosis not present

## 2018-07-20 NOTE — Progress Notes (Signed)
Patient was seen on 07/20/18 for the Core Session 11 of Diabetes Prevention Program course at [facility]. By the end of this session patients are able to complete the following objectives:   Learning Objectives:  Give examples of negative thoughts that could prevent them from meeting their goals of losing weight and being more physically active.   Describe how to stop negative thoughts and talk back to them with positive thoughts.   Practice 1) stopping negative thoughts and 2) talking back to negative thoughts with positive ones.    Goals:   Record weight taken outside of class.   Track foods and beverages eaten each day in the "Food and Activity Tracker," including calories and fat grams for each item.    Track activity type, minutes you were active, and distance you reached each day in the "Food and Activity Tracker."   If you have any negative thoughts-write them in your Food and Activity Trackers, along with how you talked back to them. Practice stopping negative thoughts and talking back to them with positive thoughts.   Follow-Up Plan:  Attend Core Session 12 next week.   Bring completed "Food and Activity Tracker" next week to be reviewed by Lifestyle Coach.

## 2018-07-27 ENCOUNTER — Encounter: Payer: Self-pay | Admitting: Registered"

## 2018-07-27 ENCOUNTER — Encounter (HOSPITAL_BASED_OUTPATIENT_CLINIC_OR_DEPARTMENT_OTHER): Payer: Medicare Other | Admitting: Registered"

## 2018-07-27 DIAGNOSIS — R7303 Prediabetes: Secondary | ICD-10-CM | POA: Diagnosis not present

## 2018-07-27 NOTE — Progress Notes (Signed)
Patient was seen on 07/27/18 for the Core Session 12 of Diabetes Prevention Program course at [facility]. By the end of this session patients are able to complete the following objectives:   Learning Objectives:  Describe their current progress toward defined goals.  Describe common causes for slipping from healthy eating or being  active.  Explain what to do to get back on their feet after a slip.  Goals:   Record weight taken outside of class.   Track foods and beverages eaten each day in the "Food and Activity Tracker," including calories and fat grams for each item.    Track activity type, minutes active, and distance reached each day in the "Food and Activity Tracker."   Try out the two action plans created during session- "Slips from Healthy Eating: Action Plan" and "Slips from Being Active: Action Plan"  Answer questions on the handout.   Follow-Up Plan:  Attend Core Session 13 next week.   Bring completed "Food and Activity Tracker" next week to be reviewed by Lifestyle Coach.

## 2018-08-03 ENCOUNTER — Encounter: Payer: Medicare Other | Attending: Internal Medicine | Admitting: Registered"

## 2018-08-03 DIAGNOSIS — R7303 Prediabetes: Secondary | ICD-10-CM | POA: Insufficient documentation

## 2018-08-03 NOTE — Progress Notes (Signed)
Patient was seen on 08/03/18 for the Core Session 13 of Diabetes Prevention Program course at [facility]. By the end of this session patients are able to complete the following objectives:   Learning Objectives:  Describe ways to add interest and variety to their activity plans.  Define ?aerobic fitness.  Explain the four F.I.T.T. principles (frequency, intensity, time, and type of activity) and how they relate to aerobic fitness.   Goals:   Record weight taken outside of class.   Track foods and beverages eaten each day in the "Food and Activity Tracker," including calories and fat grams for each item.    Track activity type, minutes you were active, and distance you reached each day in the "Food and Activity Tracker."   Do your best to reach activity goal for the week.  Use one of the F.I.T.T. principles to jump start workouts.  Document activity level on the "To Do Next Week" handout.  Follow-Up Plan:  Attend Core Session 14 next week.   Bring completed "Food and Activity Tracker" next week to be reviewed by Lifestyle Coach.

## 2018-08-10 ENCOUNTER — Encounter: Payer: Self-pay | Admitting: Registered"

## 2018-08-10 ENCOUNTER — Encounter (HOSPITAL_BASED_OUTPATIENT_CLINIC_OR_DEPARTMENT_OTHER): Payer: Medicare Other | Admitting: Registered"

## 2018-08-10 DIAGNOSIS — R7303 Prediabetes: Secondary | ICD-10-CM | POA: Diagnosis not present

## 2018-08-10 NOTE — Progress Notes (Signed)
Patient was seen on 08/10/18 for the Core Session 14 of Diabetes Prevention Program course at [facility]. By the end of this session patients are able to complete the following objectives:   Learning Objectives:  Give examples of problem social cues and helpful social cues.   Explain how to remove problem social cues and add helpful ones.   Describe ways of coping with vacations and social events such as parties, holidays, and visits from relatives and friends.   Create an action plan to change a problem social cue and add a helpful one.   Goals:   Record weight taken outside of class.   Track foods and beverages eaten each day in the "Food and Activity Tracker," including calories and fat grams for each item.    Track activity type, minutes you were active, and distance you reached each day in the "Food and Activity Tracker."   Do your best to reach activity goal for the week.  Use action plan created during session to change a problem social cue and add a helpful social cue.   Answer questions regarding success of changing social cues on "To Do Next Week" handout.    Follow-Up Plan:  Attend Core Session 15 next week.   Bring completed "Food and Activity Tracker" next week to be reviewed by Lifestyle Coach.

## 2018-10-16 ENCOUNTER — Ambulatory Visit: Payer: Medicare Other

## 2018-10-16 ENCOUNTER — Other Ambulatory Visit: Payer: Self-pay

## 2018-10-16 ENCOUNTER — Ambulatory Visit
Admission: RE | Admit: 2018-10-16 | Discharge: 2018-10-16 | Disposition: A | Discharge status: Home / Self Care | Payer: Medicare Other | Source: Ambulatory Visit | Attending: Internal Medicine | Admitting: Internal Medicine

## 2018-10-16 DIAGNOSIS — N6320 Unspecified lump in the left breast, unspecified quadrant: Secondary | ICD-10-CM | POA: Diagnosis not present

## 2018-10-16 DIAGNOSIS — R928 Other abnormal and inconclusive findings on diagnostic imaging of breast: Secondary | ICD-10-CM | POA: Diagnosis not present

## 2018-11-06 ENCOUNTER — Encounter: Payer: Medicare Other | Attending: Internal Medicine | Admitting: Registered"

## 2018-11-06 DIAGNOSIS — R7303 Prediabetes: Secondary | ICD-10-CM | POA: Diagnosis not present

## 2018-11-12 ENCOUNTER — Encounter: Payer: Self-pay | Admitting: Registered"

## 2018-11-12 NOTE — Progress Notes (Signed)
On 11/06/2018 patient completed the Core Session 15 of Diabetes Prevention Program course. By the end of this session patients are able to complete the following objectives:   Learning Objectives:  Explain how to prevent stress or cope with unavoidable stress.   Describe how this program can be a source of stress.   Explain how to manage stressful situations.   Create and follow an action plan for either preventing or coping with a stressful situation.   Goals:   Record weight taken outside of class.   Track foods and beverages eaten each day in the "Food and Activity Tracker," including calories and fat grams for each item.    Track activity type, minutes you were active, and distance you reached each day in the "Food and Activity Tracker."   Do your best to reach activity goal for the week.  Follow your action plan to reduce stress.   Answer questions on handout regarding success of action plan.   Follow-Up Plan:  Attend Core Session 16.   Bring completed "Food and Activity Tracker" next week to be reviewed by Lifestyle Coach.

## 2018-11-20 ENCOUNTER — Encounter: Payer: Self-pay | Admitting: Registered"

## 2018-11-20 ENCOUNTER — Encounter (HOSPITAL_BASED_OUTPATIENT_CLINIC_OR_DEPARTMENT_OTHER): Payer: Medicare Other | Admitting: Registered"

## 2018-11-20 DIAGNOSIS — R7303 Prediabetes: Secondary | ICD-10-CM | POA: Diagnosis not present

## 2018-11-20 NOTE — Progress Notes (Signed)
On 11/20/18 patient completed the Core Session 16 of Diabetes Prevention Program course. By the end of this session patients are able to complete the following objectives:   Learning Objectives:  Measure their progress toward weight and physical activity goals since Session 1.   Develop a plan for improving progress, if their goals have not yet been attained.   Describe ways to stay motivated long-term.   Goals:   Record weight taken outside of class.   Track foods and beverages eaten each day in the "Food and Activity Tracker," including calories and fat grams for each item.    Track activity type, minutes you were active, and distance you reached each day in the "Food and Activity Tracker."   Utilize action plan to help stay motivated and complete questions on "To Do List."   Follow-Up Plan:  Attend session 17 in two weeks.   Bring completed "Food and Activity Tracker" next session to be reviewed by Lifestyle Coach.

## 2018-11-29 ENCOUNTER — Encounter: Payer: Self-pay | Admitting: *Deleted

## 2018-12-11 ENCOUNTER — Other Ambulatory Visit: Payer: Self-pay | Admitting: Dietician

## 2018-12-11 ENCOUNTER — Ambulatory Visit (HOSPITAL_BASED_OUTPATIENT_CLINIC_OR_DEPARTMENT_OTHER): Payer: Medicare Other | Admitting: Registered"

## 2018-12-11 DIAGNOSIS — R7303 Prediabetes: Secondary | ICD-10-CM

## 2018-12-11 NOTE — Progress Notes (Signed)
[facility] request new referral for [name] to continue in the Medical Diabetes Prevention program(MDPP).

## 2018-12-16 NOTE — Addendum Note (Signed)
Addended by: Hulan Fray on: 12/16/2018 07:40 PM   Modules accepted: Orders

## 2018-12-17 ENCOUNTER — Other Ambulatory Visit: Payer: Self-pay | Admitting: Internal Medicine

## 2018-12-17 ENCOUNTER — Encounter: Payer: Self-pay | Admitting: Registered"

## 2018-12-17 DIAGNOSIS — R7303 Prediabetes: Secondary | ICD-10-CM

## 2018-12-17 NOTE — Progress Notes (Signed)
On 12/11/2018 pt completed Session 17 of Diabetes Prevention Program course. By the end of this session patients are able to complete the following objectives:   Learning Objectives:  Identify how to maintain and/or continue working toward program goals for the remainder of the program.   Describe ways that food and activity tracking can assist them in maintaining/reaching program goals.   Identify progress they have made since the beginning of the program.   Goals:   Record weight taken outside of class.   Track foods and beverages eaten each day in the "Food and Activity Tracker," including calories and fat grams for each item.    Track activity type, minutes you were active, and distance you reached each day in the "Food and Activity Tracker."   Follow-Up Plan:  Attend session 18.   Bring completed "Food and Activity Trackers" next session to be reviewed by Lifestyle Coach.

## 2019-01-15 ENCOUNTER — Encounter: Payer: Medicare Other | Attending: Internal Medicine | Admitting: Registered"

## 2019-01-15 ENCOUNTER — Encounter: Payer: Self-pay | Admitting: Registered"

## 2019-01-15 DIAGNOSIS — R7303 Prediabetes: Secondary | ICD-10-CM | POA: Diagnosis not present

## 2019-01-15 NOTE — Progress Notes (Signed)
On 01/15/2019 patient completed a session of the Diabetes Prevention Program course with [facility]. By the end of this session patients are able to complete the following objectives:   Learning Objectives:  Describe the importance of having regular meals each day and how skipping meals can negatively affect food choices and weight.   Plan out balanced meals and snacks.  List ways to avoid unplanned snacking.   Goals:   Record weight taken outside of class.   Track foods and beverages eaten each day in the "Food and Activity Tracker," including calories and fat grams for each item.    Track activity type, minutes you were active, and distance you reached each day in the "Food and Activity Tracker."   Follow-Up Plan:  Attend next session.   Email "Food and Activity Trackers" to next session to be reviewed by Lifestyle Coach.

## 2019-01-29 ENCOUNTER — Encounter (HOSPITAL_BASED_OUTPATIENT_CLINIC_OR_DEPARTMENT_OTHER): Payer: Medicare Other | Admitting: Registered"

## 2019-01-29 ENCOUNTER — Encounter: Payer: Self-pay | Admitting: Registered"

## 2019-01-29 DIAGNOSIS — R7303 Prediabetes: Secondary | ICD-10-CM | POA: Diagnosis not present

## 2019-01-29 NOTE — Progress Notes (Signed)
On 01/29/2019 patient completed a post core session of the Diabetes Prevention Program course with [facility]. By the end of this session patients are able to complete the following objectives:   Learning Objectives:  Define fiber and describe the difference between insoluble and soluble fiber   List foods that are good sources of fiber  Explain the health benefits of fiber   Describe ways to increase volume of meals and snacks while staying within fat goal.   Goals:   Record weight taken outside of class.   Track foods and beverages eaten each day in the "Food and Activity Tracker," including calories and fat grams for each item.    Track activity type, minutes you were active, and distance you reached each day in the "Food and Activity Tracker."   Follow-Up Plan:  Attend next session.   Email completed "Food and Activity Trackers" to Lifestyle Coach by next session for review.

## 2019-02-05 ENCOUNTER — Encounter: Payer: Medicare Other | Admitting: Registered"

## 2019-02-09 ENCOUNTER — Other Ambulatory Visit: Payer: Self-pay | Admitting: Internal Medicine

## 2019-02-09 DIAGNOSIS — I10 Essential (primary) hypertension: Secondary | ICD-10-CM

## 2019-02-09 NOTE — Telephone Encounter (Signed)
Needs refill on hydrochlorothiazide (HYDRODIURIL) 25 MG tablet [facility] [address] [place], [place]  ;pt contact [phone]

## 2019-02-11 MED ORDER — HYDROCHLOROTHIAZIDE 25 MG PO TABS: ORAL_TABLET | ORAL | 1 refills | Status: DC

## 2019-02-11 NOTE — Telephone Encounter (Signed)
Approved refill HCTZ 25 mg. Patient last seen on 07/15/2018 . BP 143/69 and continued on this medication. Plan to work on lifestyle modifications. Will follow up at patients next visit.   Tamsen Snider, MD PGY1  [phone]

## 2019-02-12 ENCOUNTER — Encounter: Payer: Medicare Other | Attending: Internal Medicine | Admitting: Registered"

## 2019-02-12 DIAGNOSIS — R7303 Prediabetes: Secondary | ICD-10-CM | POA: Insufficient documentation

## 2019-02-17 ENCOUNTER — Encounter: Payer: Self-pay | Admitting: Registered"

## 2019-02-17 NOTE — Progress Notes (Signed)
On 02/12/2019 pt completed a session of the Diabetes Prevention Program virtually with [facility]. By the end of this session patients are able to complete the following objectives:   Learning Objectives:  Describe how to incorporate more fruits and vegetables into meals.  List criteria for selecting good quality fruits and vegetables at the store.   Define mindful eating.  List the benefits of eating mindfully.   Goals:   Record weight taken outside of class.   Track foods and beverages eaten each day in the "Food and Activity Tracker," including calories and fat grams for each item.    Track activity type, minutes you were active, and distance you reached each day in the "Food and Activity Tracker."   Follow-Up Plan:  Attend next session.   Email completed "Food and Activity Trackers" to Lifestyle Coach before next session.

## 2019-02-26 ENCOUNTER — Encounter: Payer: Medicare Other | Admitting: Registered"

## 2019-02-26 DIAGNOSIS — R7303 Prediabetes: Secondary | ICD-10-CM

## 2019-03-01 ENCOUNTER — Encounter: Payer: Self-pay | Admitting: Registered"

## 2019-03-01 NOTE — Progress Notes (Signed)
On 02/26/2019 patient completed a post core session of the Diabetes Prevention Program virtually with [facility]. By the end of this session patients are able to complete the following objectives:   Learning Objectives:  List risk factors for heart disease.   Define the difference between HDL and LDL cholesterol  List ways to reduce risk for heart disease.   Goals:   Record weight taken outside of class.   Track foods and beverages eaten each day in the "Food and Activity Tracker," including calories and fat grams for each item.    Track activity type, minutes you were active, and distance you reached each day in the "Food and Activity Tracker."   Follow-Up Plan:  Attend next session.   Email completed "Food and Activity Trackers" to be reviewed by Lifestyle Coach.

## 2019-03-05 ENCOUNTER — Other Ambulatory Visit: Payer: Self-pay | Admitting: Internal Medicine

## 2019-03-05 ENCOUNTER — Other Ambulatory Visit: Payer: Self-pay | Admitting: *Deleted

## 2019-03-05 DIAGNOSIS — K219 Gastro-esophageal reflux disease without esophagitis: Secondary | ICD-10-CM

## 2019-03-05 NOTE — Telephone Encounter (Signed)
New encounter started

## 2019-03-05 NOTE — Telephone Encounter (Signed)
REFILL REQUEST  pantoprazole (PROTONIX) 40 MG tablet  [facility] [address], [place] [phone] (Phone) [phone] (Fax)

## 2019-03-08 MED ORDER — PANTOPRAZOLE SODIUM 40 MG PO TBEC: 40.0000 mg | DELAYED_RELEASE_TABLET | Freq: Every day | ORAL | 0 refills | Status: DC

## 2019-03-10 ENCOUNTER — Encounter: Payer: Self-pay | Admitting: Internal Medicine

## 2019-03-10 ENCOUNTER — Ambulatory Visit (INDEPENDENT_AMBULATORY_CARE_PROVIDER_SITE_OTHER): Payer: Medicare Other | Admitting: Internal Medicine

## 2019-03-10 ENCOUNTER — Other Ambulatory Visit: Payer: Self-pay

## 2019-03-10 VITALS — BP 133/64 | HR 77 | Temp 98.7°F | Wt 300.0 lb

## 2019-03-10 DIAGNOSIS — Z8719 Personal history of other diseases of the digestive system: Secondary | ICD-10-CM

## 2019-03-10 DIAGNOSIS — E785 Hyperlipidemia, unspecified: Secondary | ICD-10-CM | POA: Diagnosis not present

## 2019-03-10 DIAGNOSIS — K219 Gastro-esophageal reflux disease without esophagitis: Secondary | ICD-10-CM | POA: Diagnosis not present

## 2019-03-10 DIAGNOSIS — Z23 Encounter for immunization: Secondary | ICD-10-CM

## 2019-03-10 DIAGNOSIS — E669 Obesity, unspecified: Secondary | ICD-10-CM

## 2019-03-10 DIAGNOSIS — Z79899 Other long term (current) drug therapy: Secondary | ICD-10-CM

## 2019-03-10 DIAGNOSIS — Z6841 Body Mass Index (BMI) 40.0 and over, adult: Secondary | ICD-10-CM

## 2019-03-10 DIAGNOSIS — R7303 Prediabetes: Secondary | ICD-10-CM | POA: Diagnosis not present

## 2019-03-10 DIAGNOSIS — I10 Essential (primary) hypertension: Secondary | ICD-10-CM

## 2019-03-10 MED ORDER — HYDROCHLOROTHIAZIDE 25 MG PO TABS: ORAL_TABLET | ORAL | 3 refills | Status: DC

## 2019-03-10 MED ORDER — PRAVASTATIN SODIUM 40 MG PO TABS: 40.0000 mg | ORAL_TABLET | Freq: Every day | ORAL | 3 refills | Status: DC

## 2019-03-10 MED ORDER — PANTOPRAZOLE SODIUM 40 MG PO TBEC: 40.0000 mg | DELAYED_RELEASE_TABLET | Freq: Every day | ORAL | 1 refills | Status: DC

## 2019-03-10 NOTE — Patient Instructions (Addendum)
Thank you for trusting me with your care. To recap, today we discussed the following:  Blood pressure - BP 133/64 - Keep taking HCTZ 25  Pre-diabetes / nutrition - Keep up the work with Ms.Leonard  Reflux w/ history of bleeding ulcer - Continue the protonix   We will check yearly labs and follow up if they are abnormal.    My best,  Tamsen Snider, MD

## 2019-03-10 NOTE — Assessment & Plan Note (Signed)
Patient is currently on moderate intensity statin. 10 year risk of heart disease or stroke using ASCVD calculator is 10.5% using lipid panel in 2019.  - Continue pravastatin 40 mg

## 2019-03-10 NOTE — Assessment & Plan Note (Addendum)
Patient is currently working with RD and is approximately 11lbs heavier than visit earlier this year. Encourage exercise and continue work with RD. Denies constipation and lethargy.  - TSH

## 2019-03-10 NOTE — Addendum Note (Signed)
Addended by: Marcelino Duster on: 03/10/2019 03:50 PM   Modules accepted: Orders

## 2019-03-10 NOTE — Assessment & Plan Note (Addendum)
BP 133/64 today. Patient currently on HCTZ 25. - Continue HCTZ 25 - BMP

## 2019-03-10 NOTE — Progress Notes (Signed)
   CC: pre-diabetes and high blood pressure  HPI:  [name] is a 68 y.o. with past medical history listed below. Please see problem based charting for details of presentation, assessment , and plan.    Past Medical History:  Diagnosis Date  . Acanthosis nigricans   . Allergic rhinitis   . Asthma    in past  . Colonic polyp    Hyperplastic, no adenomatous or malignant changes.   . Elevated LFTs    Resolved- etiology unclear hep B, Hep C serology neg 2005  . Endometrial polyp    bemogm 12/06  . Gastric ulcer 02/20/2013  . Gastric ulcer with hemorrhage 02/20/2013  . Hx of abnormal cervical Pap smear    1980 normal in 2006.   Marland Kitchen Hyperlipidemia   . Hypertension   . Iron deficiency anemia    postmenopausal bleeding followed by Dr. Kalman Shan.  Hx of benign endometrial polyp.   . Lumbar back pain   . Obesity    morbid obesity  . Premenopausal menorrhagia   . Right knee pain    Review of Systems: Review of Systems  Constitutional: Negative for chills and fever.  Cardiovascular: Negative for chest pain and leg swelling.    Vitals:   03/10/19 1000  BP: 133/64  Pulse: 77  Temp: 98.7 F (37.1 C)  TempSrc: Oral  SpO2: 99%  Weight: 300 lb (136.1 kg)   Physical Exam: Physical Exam Constitutional:      Appearance: Normal appearance. She is obese.  Cardiovascular:     Rate and Rhythm: Normal rate and regular rhythm.  Pulmonary:     Breath sounds: Normal breath sounds. No wheezing, rhonchi or rales.  Abdominal:     General: Bowel sounds are normal.     Tenderness: There is no abdominal tenderness.  Neurological:     Mental Status: She is alert.  Psychiatric:        Mood and Affect: Mood normal.        Thought Content: Thought content normal.      Assessment & Plan:   See Encounters Tab for problem based charting.  Patient seen with Dr. Dareen Piano

## 2019-03-10 NOTE — Progress Notes (Signed)
Internal Medicine Clinic Attending  I saw and evaluated the patient.  I personally confirmed the key portions of the history and exam documented by Dr. Steen and I reviewed pertinent patient test results.  The assessment, diagnosis, and plan were formulated together and I agree with the documentation in the resident's note.     

## 2019-03-10 NOTE — Assessment & Plan Note (Addendum)
Hgb A1c was 6.1 in 2019 , will check today. Patient reports working with RD for the past year. Encourage increase in exercise. Currently walking 20 minutes per day. - Hemoglobin A1c

## 2019-03-11 LAB — BMP8+ANION GAP
Anion Gap: 16 mmol/L (ref 10.0–18.0)
BUN/Creatinine Ratio: 13 (ref 12–28)
BUN: 13 mg/dL (ref 8–27)
CO2: 25 mmol/L (ref 20–29)
Calcium: 9.6 mg/dL (ref 8.7–10.3)
Chloride: 100 mmol/L (ref 96–106)
Creatinine, Ser: 0.98 mg/dL (ref 0.57–1.00)
GFR calc Af Amer: 70 mL/min/{1.73_m2} (ref >59)
GFR calc non Af Amer: 60 mL/min/{1.73_m2} (ref >59)
Glucose: 106 mg/dL — ABNORMAL HIGH (ref 65–99)
Potassium: 3.9 mmol/L (ref 3.5–5.2)
Sodium: 141 mmol/L (ref 134–144)

## 2019-03-11 LAB — HEMOGLOBIN A1C
Est. average glucose Bld gHb Est-mCnc: 134 mg/dL
Hgb A1c MFr Bld: 6.3 % — ABNORMAL HIGH (ref 4.8–5.6)

## 2019-03-11 LAB — TSH: TSH: 2.71 u[IU]/mL (ref 0.450–4.500)

## 2019-03-12 ENCOUNTER — Encounter: Payer: Self-pay | Admitting: Registered"

## 2019-03-12 ENCOUNTER — Encounter: Payer: Medicare Other | Attending: Internal Medicine | Admitting: Registered"

## 2019-03-12 DIAGNOSIS — R7303 Prediabetes: Secondary | ICD-10-CM | POA: Insufficient documentation

## 2019-03-12 NOTE — Progress Notes (Signed)
On 03/12/2019 pt completed Session 22 of Diabetes Prevention Program course virtually with [facility]. By the end of this session patients are able to complete the following objectives:   Learning Objectives:  Describe the difference between a lapse and a relapse.  List steps to prevent a lapse from becoming a relapse.   Identify situations that increase risk of having a lapse.   Make a plan to help prevent lapses and recover after a lapse has occurred.   Goals:   Record weight taken outside of class.   Track foods and beverages eaten each day in the "Food and Activity Tracker," including calories and fat grams for each item.    Track activity type, minutes you were active, and distance you reached each day in the "Food and Activity Tracker."   Follow-Up Plan:  Attend session 23  Email completed "Food and Activity Trackers" to be reviewed by Lifestyle Coach

## 2019-04-20 ENCOUNTER — Encounter: Payer: Medicare Other | Attending: Internal Medicine | Admitting: Registered"

## 2019-04-20 ENCOUNTER — Other Ambulatory Visit: Payer: Self-pay

## 2019-04-20 ENCOUNTER — Encounter: Payer: Self-pay | Admitting: Registered"

## 2019-04-20 DIAGNOSIS — R7303 Prediabetes: Secondary | ICD-10-CM | POA: Insufficient documentation

## 2019-04-20 NOTE — Progress Notes (Signed)
On 04/20/2019 patient completed a session of the Diabetes Prevention Program course virtually with [facility]. By the end of this session patients are able to complete the following objectives:   Learning Objectives:  Counter self-defeating thoughts with positive self-statements  Define assertiveness.   List examples of ways to practice assertiveness.   Goals:   Record weight taken outside of class.   Track foods and beverages eaten each day in the "Food and Activity Tracker," including calories and fat grams for each item.    Track activity type, minutes you were active, and distance you reached each day in the "Food and Activity Tracker."   Follow-Up Plan:  Attend next session.   Email completed "Food and Activity Trackers" to Lifestyle Coach by next session to be reviewed.

## 2019-05-14 ENCOUNTER — Encounter: Payer: Medicare Other | Attending: Internal Medicine | Admitting: Registered"

## 2019-05-14 DIAGNOSIS — R7303 Prediabetes: Secondary | ICD-10-CM | POA: Insufficient documentation

## 2019-05-15 ENCOUNTER — Encounter: Payer: Self-pay | Admitting: Registered"

## 2019-05-15 NOTE — Progress Notes (Signed)
On 05/14/2019 patient completed a post core session of the Diabetes Prevention Program course virtually with [facility]. By the end of this session patients are able to complete the following objectives:   Learning Objectives:  Explain how glucose is used in the body and it's relationship with insulin/insulin resistance.   Identify symptoms of diabetes.   Describe lab tests used to diagnose diabetes.   Describe health complications and conditions related to diabetes.   Goals:   Record weight taken outside of class.   Track foods and beverages eaten each day in the "Food and Activity Tracker," including calories and fat grams for each item.    Track activity type, minutes you were active, and distance you reached each day in the "Food and Activity Tracker."   Follow-Up Plan:  Attend next session.   Email completed "Food and Activity Trackers" before next session to be reviewed by Lifestyle Coach.

## 2019-06-08 IMAGING — [facility] DIGITAL DIAGNOSTIC UNILATERAL LEFT MAMMOGRAM WITH TOMO AND CAD
8 series · 8 of 24 positions shown · non-contrast
Comparison: Previous exam(s).

CLINICAL DATA: Followup probably benign mass in the inferior
posterior left breast with a possible corresponding area of fat
necrosis at ultrasound.

EXAM:
DIGITAL DIAGNOSTIC LEFT MAMMOGRAM WITH CAD AND TOMO
ULTRASOUND LEFT BREAST

[L MLO synth-2D (1 of 2)]
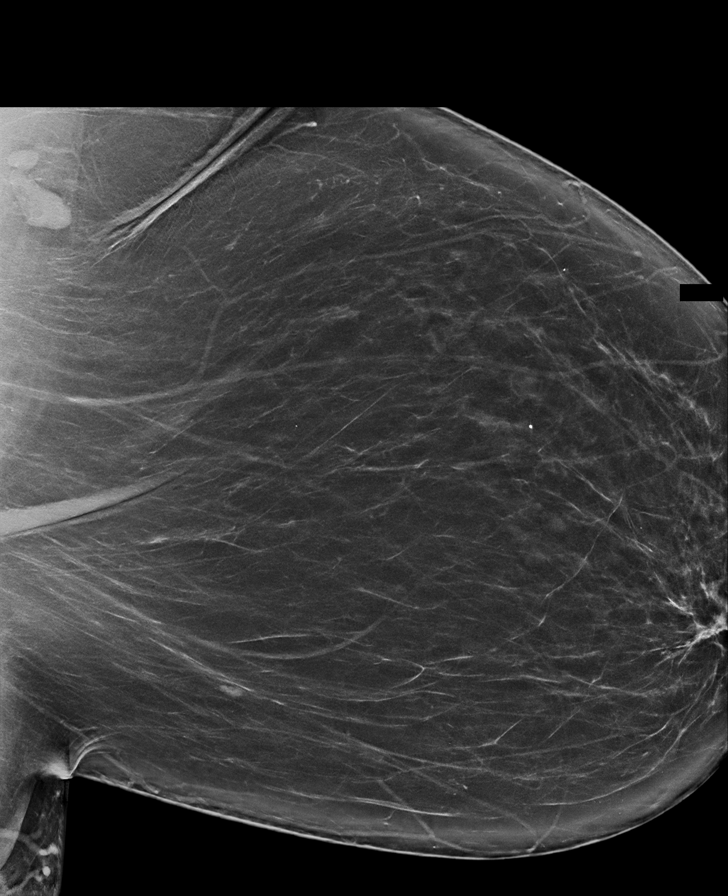

[L MLO synth-2D (2 of 2)]
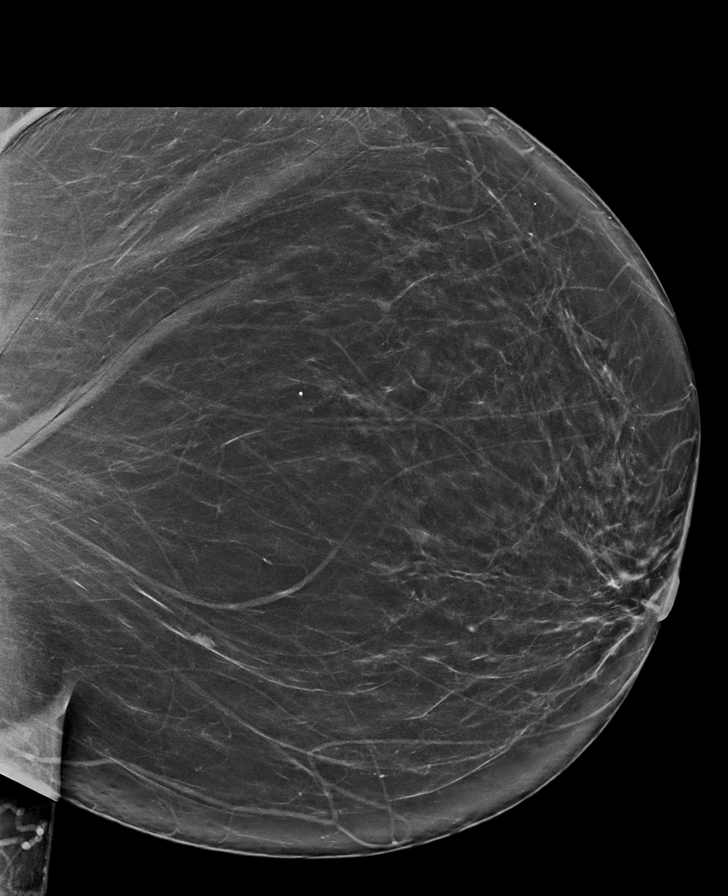

[L CC synth-2D (1 of 2)]
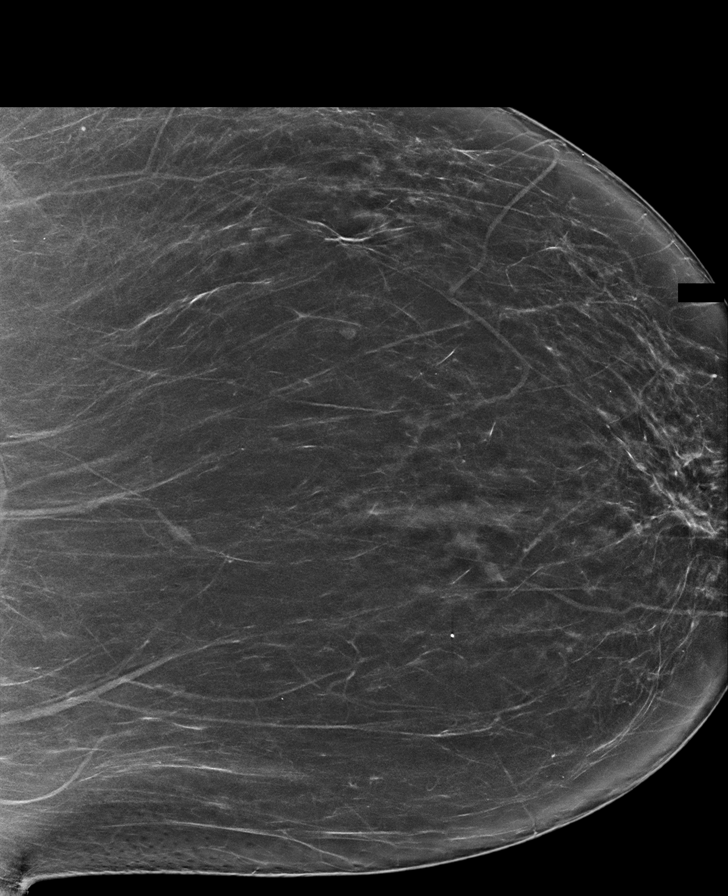

[L CC synth-2D (2 of 2)]
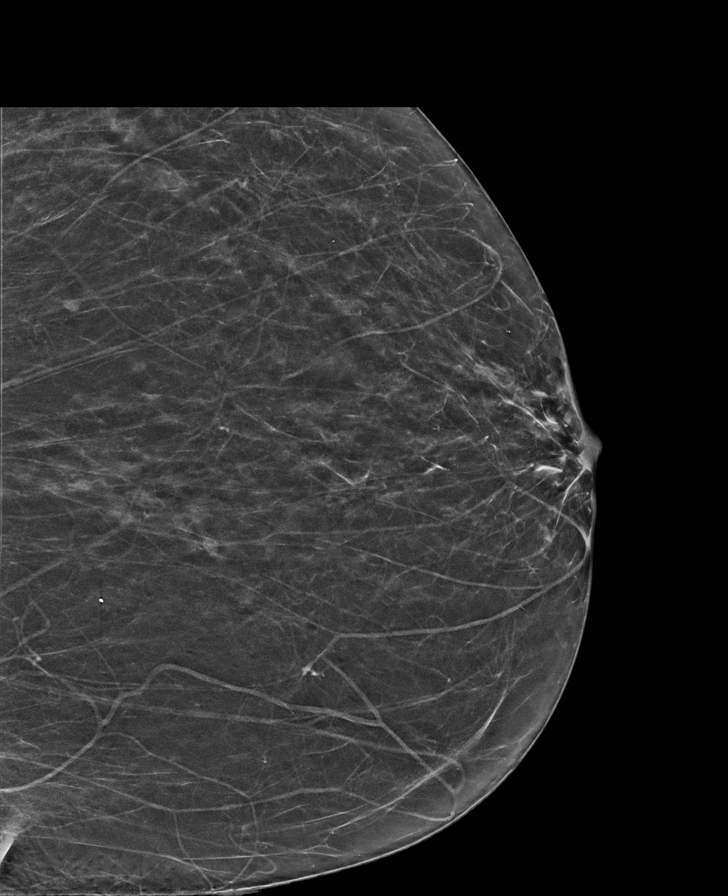

[L CC tomo (1 of 2) · tomo slice 41/80.0]
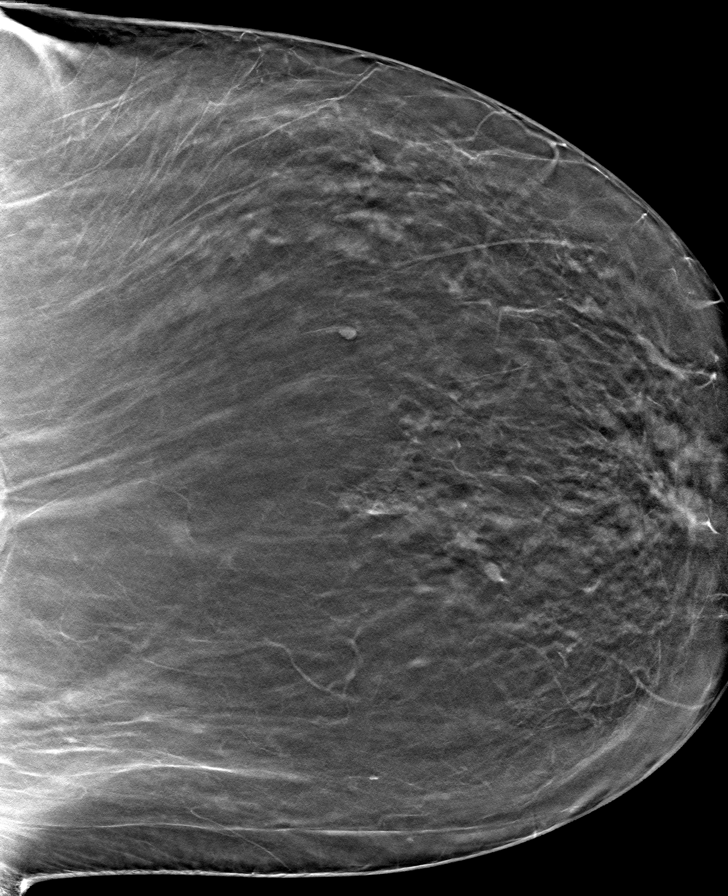

[L MLO tomo (1 of 2) · tomo slice 43/86.0]
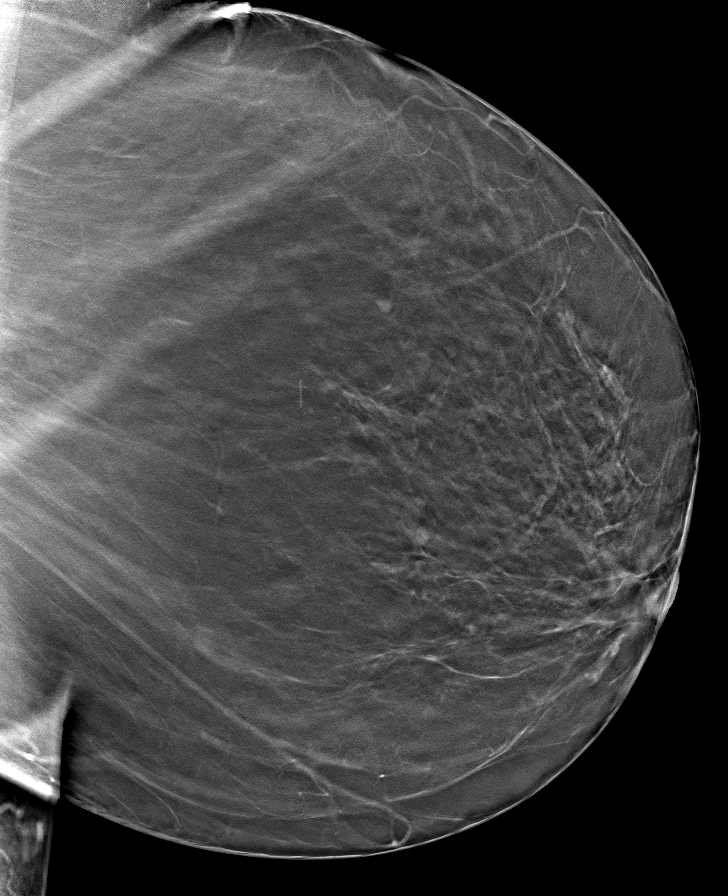

[L MLO tomo (2 of 2) · tomo slice 47/92.0]
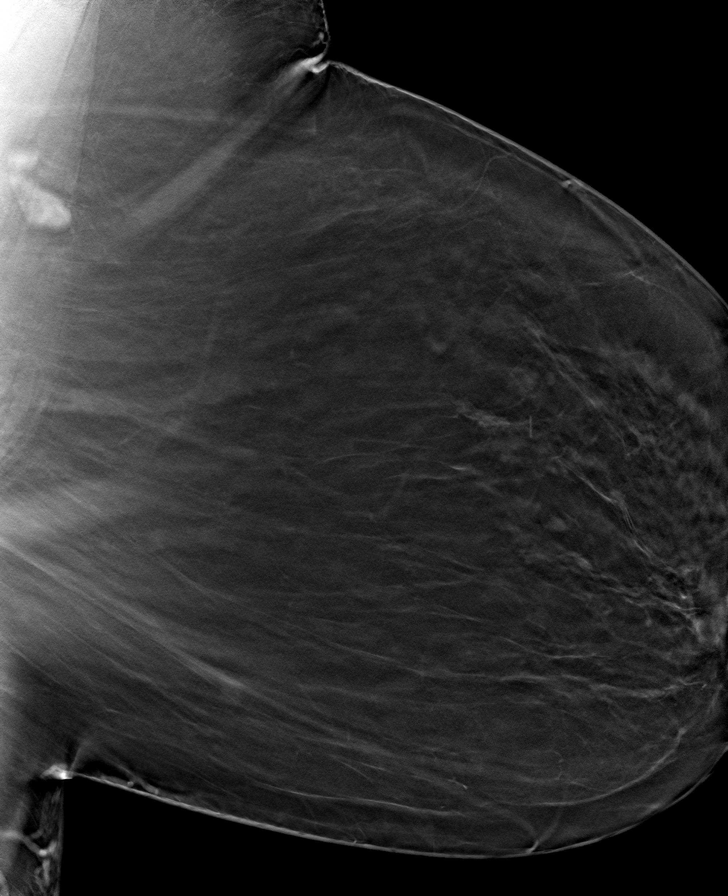

[L CC tomo (2 of 2) · tomo slice 32/63.0]
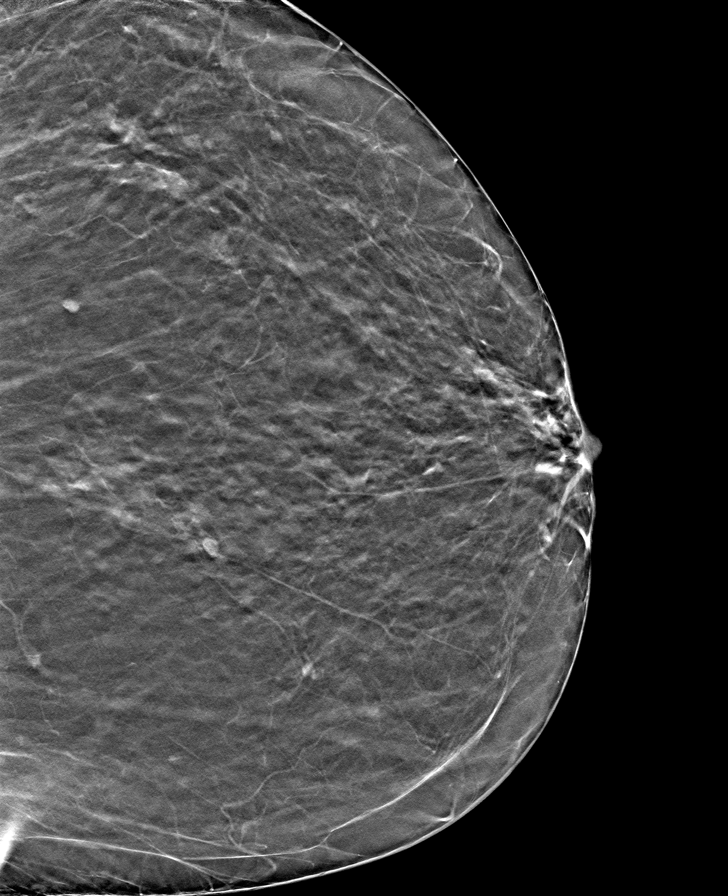

[8 of 24 positions shown; findings below may reference images not displayed]

ACR Breast Density Category b: There are scattered areas of
fibroglandular density.
FINDINGS: The previously demonstrated small, oval, circumscribed mass in the
posterior aspect of the inferior left breast is unchanged. No
interval mammographic findings suspicious for malignancy.

Mammographic images were processed with CAD.

On physical exam, no mass is palpable in the inferior left breast.

Targeted ultrasound is performed, showing normal appearing breast
tissue throughout the inferior left breast. The 8 mm area of
probable fat necrosis seen in the 5:30 o'clock position of the left
breast on 10/13/2017 is no longer seen.
IMPRESSION: 1. Stable small probably benign mass in the posterior aspect of the
inferior left breast.
2. Resolved fat necrosis in 5:30 o'clock position of the left
breast.

RECOMMENDATION:
Bilateral diagnostic mammogram in 6 months. That will be 1 year
since mammographic evaluation of the right breast.

I have discussed the findings and recommendations with the patient.
Results were also provided in writing at the conclusion of the
visit. If applicable, a reminder letter will be sent to the patient
regarding the next appointment.

BI-RADS CATEGORY  3: Probably benign.

## 2019-06-11 ENCOUNTER — Encounter: Payer: Medicare Other | Attending: Internal Medicine | Admitting: Registered"

## 2019-06-11 DIAGNOSIS — R7303 Prediabetes: Secondary | ICD-10-CM | POA: Insufficient documentation

## 2019-06-15 ENCOUNTER — Encounter: Payer: Self-pay | Admitting: Registered"

## 2019-06-15 NOTE — Progress Notes (Signed)
On 06/11/2019 pt completed a session of the Diabetes Prevention Program course virtually with [facility]. By the end of this session patients are able to complete the following objectives:   Learning Objectives:  Describe the differences between unsaturated, saturated, and trans fat on heart health.   List dietary sources of unsaturated, saturated, and trans fats.  Explain ways to reduce intake of saturated fats and replace them with heart healthy fats.  Goals:   Record weight taken outside of class.   Track foods and beverages eaten each day in the "Food and Activity Tracker," including calories and fat grams for each item.    Track activity type, minutes you were active, and distance you reached each day in the "Food and Activity Tracker."   Follow-Up Plan:  Attend next session.   Email completed "Food and Activity Trackers" to next session to be reviewed by Lifestyle Coach.

## 2019-07-16 ENCOUNTER — Encounter: Payer: Medicare Other | Attending: Internal Medicine | Admitting: Registered"

## 2019-07-16 DIAGNOSIS — R7303 Prediabetes: Secondary | ICD-10-CM | POA: Insufficient documentation

## 2019-07-22 ENCOUNTER — Encounter: Payer: Self-pay | Admitting: Registered"

## 2019-07-22 NOTE — Progress Notes (Signed)
On 07/16/19 patient completed a post core session of the Diabetes Prevention Program virtually with [facility]. By the end of this session patients are able to complete the following objectives:   Learning Objectives: Marland Kitchen List indoor physical activity options.  . Identify any barriers to being active and brainstorm how to overcome barriers.  . Describe short and long-term health benefits of physical activity.   Goals:   Record weight taken outside of class.   Track foods and beverages eaten each day in the "Food and Activity Tracker," including calories and fat grams for each item.    Track activity type, minutes you were active, and distance you reached each day in the "Food and Activity Tracker."   Follow-Up Plan:  Attend next session.   Email completed "Food and Activity Trackers" before next session to be reviewed by Lifestyle Coach.

## 2019-08-13 ENCOUNTER — Encounter: Payer: Medicare Other | Attending: Internal Medicine | Admitting: Registered"

## 2019-08-13 DIAGNOSIS — R7303 Prediabetes: Secondary | ICD-10-CM | POA: Insufficient documentation

## 2019-08-14 ENCOUNTER — Other Ambulatory Visit: Payer: Self-pay | Admitting: Internal Medicine

## 2019-08-14 DIAGNOSIS — I10 Essential (primary) hypertension: Secondary | ICD-10-CM

## 2019-08-16 ENCOUNTER — Encounter: Payer: Self-pay | Admitting: Registered"

## 2019-08-16 NOTE — Progress Notes (Signed)
On 08/13/19 pt completed the last session of the Diabetes Prevention Program course virtually with [facility]. By the end of this session patients are able to complete the following objectives:   Learning Objectives:  Reflect on lifestyle changes they have made since starting the DPP.   Set long-term goals to promote continued maintenance of lifestyle changes made during the program.   Goals:   Work toward reaching new long-term goals set during class.   Follow-Up Plan:  Contact Lifestyle Coach with questions/concerns PRN.

## 2019-09-05 ENCOUNTER — Other Ambulatory Visit: Payer: Self-pay | Admitting: Internal Medicine

## 2019-09-05 DIAGNOSIS — K219 Gastro-esophageal reflux disease without esophagitis: Secondary | ICD-10-CM

## 2019-09-30 ENCOUNTER — Other Ambulatory Visit: Payer: Self-pay | Admitting: Internal Medicine

## 2019-09-30 DIAGNOSIS — N6489 Other specified disorders of breast: Secondary | ICD-10-CM

## 2019-10-20 ENCOUNTER — Other Ambulatory Visit: Payer: Self-pay

## 2019-10-20 ENCOUNTER — Ambulatory Visit: Payer: Medicare Other

## 2019-10-20 ENCOUNTER — Ambulatory Visit
Admission: RE | Admit: 2019-10-20 | Discharge: 2019-10-20 | Disposition: A | Discharge status: Home / Self Care | Payer: Medicare Other | Source: Ambulatory Visit | Attending: *Deleted | Admitting: *Deleted

## 2019-10-20 DIAGNOSIS — N6489 Other specified disorders of breast: Secondary | ICD-10-CM

## 2019-10-20 DIAGNOSIS — R928 Other abnormal and inconclusive findings on diagnostic imaging of breast: Secondary | ICD-10-CM | POA: Diagnosis not present

## 2020-01-20 ENCOUNTER — Other Ambulatory Visit: Payer: Self-pay

## 2020-01-20 ENCOUNTER — Encounter: Payer: Self-pay | Admitting: Internal Medicine

## 2020-01-20 ENCOUNTER — Ambulatory Visit (INDEPENDENT_AMBULATORY_CARE_PROVIDER_SITE_OTHER): Payer: Medicare Other | Admitting: Internal Medicine

## 2020-01-20 VITALS — BP 126/66 | HR 70 | Temp 98.2°F | Ht 65.0 in | Wt 300.4 lb

## 2020-01-20 DIAGNOSIS — K219 Gastro-esophageal reflux disease without esophagitis: Secondary | ICD-10-CM | POA: Diagnosis not present

## 2020-01-20 DIAGNOSIS — R7303 Prediabetes: Secondary | ICD-10-CM

## 2020-01-20 DIAGNOSIS — E785 Hyperlipidemia, unspecified: Secondary | ICD-10-CM | POA: Diagnosis not present

## 2020-01-20 DIAGNOSIS — I10 Essential (primary) hypertension: Secondary | ICD-10-CM | POA: Diagnosis not present

## 2020-01-20 DIAGNOSIS — R269 Unspecified abnormalities of gait and mobility: Secondary | ICD-10-CM | POA: Insufficient documentation

## 2020-01-20 DIAGNOSIS — Z6841 Body Mass Index (BMI) 40.0 and over, adult: Secondary | ICD-10-CM

## 2020-01-20 LAB — POCT GLYCOSYLATED HEMOGLOBIN (HGB A1C): Hemoglobin A1C: 6.3 % — AB (ref 4.0–5.6)

## 2020-01-20 LAB — GLUCOSE, CAPILLARY: Glucose-Capillary: 97 mg/dL (ref 70–99)

## 2020-01-20 MED ORDER — PANTOPRAZOLE SODIUM 40 MG PO TBEC: 40.0000 mg | DELAYED_RELEASE_TABLET | Freq: Every day | ORAL | 3 refills | Status: DC

## 2020-01-20 MED ORDER — HYDROCHLOROTHIAZIDE 25 MG PO TABS: ORAL_TABLET | ORAL | 3 refills | Status: DC

## 2020-01-20 MED ORDER — PRAVASTATIN SODIUM 40 MG PO TABS: 40.0000 mg | ORAL_TABLET | Freq: Every day | ORAL | 3 refills | Status: DC

## 2020-01-20 NOTE — Assessment & Plan Note (Signed)
Gastroesophageal reflux disease without esophagitis Refilled - pantoprazole (PROTONIX) 40 MG tablet; Take 1 tablet (40 mg total) by mouth daily.  Dispense: 90 tablet; Refill: 3

## 2020-01-20 NOTE — Assessment & Plan Note (Signed)
Prediabetes      Hemoglobin A1c 6.3 10 months ago and 6.3 today. - encouraging intense diet and exercise. Patient has worked with 2 RD, but has been unable to reach her weight goals.  - Discussed starting Metformin, patient will read on this medication and discuss this further in the future - Hgb A1c in 3-6  months

## 2020-01-20 NOTE — Patient Instructions (Signed)
Thank you for trusting me with your care. To recap, today we discussed the following:  1. Hyperlipidemia, unspecified hyperlipidemia type Refill sent - pravastatin (PRAVACHOL) 40 MG tablet; Take 1 tablet (40 mg total) by mouth daily.  Dispense: 90 tablet; Refill: 3  2. Gastroesophageal reflux disease without esophagitis Refill Sent - pantoprazole (PROTONIX) 40 MG tablet; Take 1 tablet (40 mg total) by mouth daily.  Dispense: 90 tablet; Refill: 3  3. Essential hypertension BP: 126/66 - Great! At goal!  Refill Sent - hydrochlorothiazide (HYDRODIURIL) 25 MG tablet; TAKE 1 TABLET BY MOUTH DAILY.  Dispense: 90 tablet; Refill: 3 - BMP8+Anion Gap  4. Prediabetes - POC Hbg A1C - Read up on Metformin. This would be the medication I would like to start if your Hbg A1c comes back in diabetic range.   5. Gait abnormality due to uneven legs - Referral to Sports Medicine for orthotics

## 2020-01-20 NOTE — Assessment & Plan Note (Signed)
I have been encouraging patient to be more active as I see her obesity to be her biggest health risk. She has bilateral pain in her knees and starting to develop some hip pain limiting her activity. On evaluation of her gate it is apparent she has a leg discrepancy.  Plan: Referral to sports medicine for evaluation and consideration of orthotics

## 2020-01-20 NOTE — Progress Notes (Signed)
   CC: uneven gait, weight gain, and hx of prediabetes   HPI:[name] is a 69 y.o. female who presents for a yearly checkup and specifically we evaluated her for diabetes, uneven gait, and please see individual problem based A/P for details.   Past Medical History:  Diagnosis Date  . Acanthosis nigricans   . Allergic rhinitis   . Asthma    in past  . Colonic polyp    Hyperplastic, no adenomatous or malignant changes.   . Elevated LFTs    Resolved- etiology unclear hep B, Hep C serology neg 2005  . Endometrial polyp    bemogm 12/06  . Gastric ulcer 02/20/2013  . Gastric ulcer with hemorrhage 02/20/2013  . Hx of abnormal cervical Pap smear    1980 normal in 2006.   Marland Kitchen Hyperlipidemia   . Hypertension   . Iron deficiency anemia    postmenopausal bleeding followed by Dr. Kalman Shan.  Hx of benign endometrial polyp.   . Lumbar back pain   . Obesity    morbid obesity  . Premenopausal menorrhagia   . Right knee pain    Review of Systems:   Review of Systems  Constitutional: Negative for chills and fever.  Cardiovascular: Negative for chest pain and palpitations.  Musculoskeletal: Positive for joint pain. Negative for falls.     Physical Exam: Vitals:   01/20/20 1326  BP: 126/66  Pulse: 70  Temp: 98.2 F (36.8 C)  TempSrc: Oral  SpO2: 98%  Weight: (!) 300 lb 6.4 oz (136.3 kg)  Height: 5\' 5"  (1.651 m)   General: NAD, nl appearance HEENT: Normocephalic, atraumatic , Conjunctiva nl  Cardiovascular: Normal rate, regular rhythm.  No murmurs, rubs, or gallops Pulmonary : Equal breath sounds, No wheezes, rales, or rhonchi Abdominal: soft, nontender,  bowel sounds present MSK: altered gait due to leg length discrepancy    Assessment & Plan:   See Encounters Tab for problem based charting.  Patient discussed with Dr. Philipp Ovens

## 2020-01-20 NOTE — Assessment & Plan Note (Signed)
BP Readings from Last 3 Encounters:  01/20/20 126/66  03/10/19 133/64  07/15/18 (!) 143/69   Patient BP at goal. Compliant on HCTZ. Denies any lightheaded episodes or dizziness upon standing.  Plan: HCTZ 25 mg daily -BMP today

## 2020-01-20 NOTE — Assessment & Plan Note (Signed)
Hyperlipidemia Refilled - pravastatin (PRAVACHOL) 40 MG tablet; Take 1 tablet (40 mg total) by mouth daily.  Dispense: 90 tablet; Refill: 3

## 2020-01-21 LAB — BMP8+ANION GAP
Anion Gap: 18 mmol/L (ref 10.0–18.0)
BUN/Creatinine Ratio: 9 — ABNORMAL LOW (ref 12–28)
BUN: 10 mg/dL (ref 8–27)
CO2: 23 mmol/L (ref 20–29)
Calcium: 9.5 mg/dL (ref 8.7–10.3)
Chloride: 100 mmol/L (ref 96–106)
Creatinine, Ser: 1.09 mg/dL — ABNORMAL HIGH (ref 0.57–1.00)
GFR calc Af Amer: 61 mL/min/{1.73_m2} (ref >59)
GFR calc non Af Amer: 53 mL/min/{1.73_m2} — ABNORMAL LOW (ref >59)
Glucose: 103 mg/dL — ABNORMAL HIGH (ref 65–99)
Potassium: 3.9 mmol/L (ref 3.5–5.2)
Sodium: 141 mmol/L (ref 134–144)

## 2020-01-28 ENCOUNTER — Other Ambulatory Visit: Payer: Self-pay

## 2020-01-28 ENCOUNTER — Ambulatory Visit
Admission: RE | Admit: 2020-01-28 | Discharge: 2020-01-28 | Disposition: A | Discharge status: Home / Self Care | Payer: Medicare Other | Source: Ambulatory Visit | Attending: Family Medicine | Admitting: Family Medicine

## 2020-01-28 ENCOUNTER — Ambulatory Visit (INDEPENDENT_AMBULATORY_CARE_PROVIDER_SITE_OTHER): Payer: Medicare Other | Admitting: Family Medicine

## 2020-01-28 VITALS — BP 126/83 | Ht 65.0 in | Wt 296.0 lb

## 2020-01-28 DIAGNOSIS — M25851 Other specified joint disorders, right hip: Secondary | ICD-10-CM | POA: Diagnosis not present

## 2020-01-28 DIAGNOSIS — M25551 Pain in right hip: Secondary | ICD-10-CM

## 2020-01-28 DIAGNOSIS — M1611 Unilateral primary osteoarthritis, right hip: Secondary | ICD-10-CM | POA: Diagnosis not present

## 2020-01-28 DIAGNOSIS — M76892 Other specified enthesopathies of left lower limb, excluding foot: Secondary | ICD-10-CM | POA: Diagnosis not present

## 2020-01-28 NOTE — Progress Notes (Signed)
PCP: Madalyn Rob, MD  Subjective:   CC: Patient is a 69 y.o. female here for chronic R hip pain.  HPI: R- side hip pain that has been worsening over the past couple of years.  No known injury or falls. No groin pain. Does not radiate down leg. Does not interfere with sleep.  The pain is most exacerbated by standing from a seated position and going up stairs.  When she has taken a few steps it seems like she has improvement in pain with walking but still has difficulty with long distances. Can walk pretty good for normal chores.  Has no pain at rest. No cane or walker. No weakness.  Has taken tylenol for the pain which helps. Also uses vicks and icy hot.  DEXA scan 2019 showed normal bone density     Objective:  BP 126/83   Ht 5\' 5"  (1.651 m)   Wt 296 lb (134.3 kg)   BMI 49.26 kg/m   Physical Exam: Gen: NAD, comfortable in exam room Right hip: Observation: Negative for gross abnormalities Palpation: Tenderness to palpation of greater trochanteric area ROM Difficulty with hip extension when laying flat on back Strength: Mild decreased strength in hip flexion and abduction compared to left Special tests: Positive Corky Sox, negative FADIR Negative straight leg raise Gait: Algesic gait.  Favors left leg.   Assessment & Plan:  Right hip pain Chronic right hip pain with exam consistent for joint arthritis -Provide patient with hip flexor exercises -Hip x-rays to assess for arthritic changes or other abnormalities -Discuss with patient on treatment options based on imaging results -Continue Tylenol as needed   Doristine Mango, [facility] PGY-3

## 2020-01-28 NOTE — Progress Notes (Signed)
[name] Attending Note: I have seen and examined this patient. I have discussed this patient with the resident and reviewed the assessment and plan as documented above. I agree with the resident's findings and plan. Hip pain Most consistent with DJD. X rays and will talk with her after that. She is really NOT excited about a hip replacement (which I did bring up) as she says her Mom had both hips done at age mid-31s. She does not think it really helped her Mom be more active. She is unclear whether it helped her Mom's pain. We briefly discussed and I recommended further discussion with her Mom who is now in her 68 and uses a walker.  I think she would benefit from discussion with ortho if her X rays show the amount of DJD I suspect.  We did give her some hip flexor exercises.

## 2020-01-28 NOTE — Patient Instructions (Signed)
Let's get some X rays and then we will call you. I suspect you would benefit from speaking with an orthopedic surgeon, but we can decide after we see your films. Great to meet you!

## 2020-01-28 NOTE — Assessment & Plan Note (Signed)
Chronic right hip pain with exam consistent for joint arthritis -Provide patient with hip flexor exercises -Hip x-rays to assess for arthritic changes or other abnormalities -Discuss with patient on treatment options based on imaging results -Continue Tylenol as needed

## 2020-01-31 ENCOUNTER — Other Ambulatory Visit: Payer: Self-pay

## 2020-01-31 DIAGNOSIS — M25551 Pain in right hip: Secondary | ICD-10-CM

## 2020-02-02 NOTE — Progress Notes (Signed)
Internal Medicine Clinic Attending  Case discussed with Dr. Steen  At the time of the visit.  We reviewed the resident's history and exam and pertinent patient test results.  I agree with the assessment, diagnosis, and plan of care documented in the resident's note.  

## 2020-02-14 ENCOUNTER — Ambulatory Visit: Payer: Medicare Other | Admitting: Orthopaedic Surgery

## 2020-02-14 DIAGNOSIS — M25551 Pain in right hip: Secondary | ICD-10-CM | POA: Diagnosis not present

## 2020-02-14 NOTE — Progress Notes (Signed)
Office Visit Note   Patient: [name]           Date of Birth: 06-Jan-1951           MRN: [id number] Visit Date: 02/14/2020              Requested by: Dickie La, MD [address] [place],  [place] [address] PCP: Madalyn Rob, MD   Assessment & Plan: Visit Diagnoses:  1. Pain in right hip     Plan: Based on her clinical exam of the right hip and her plain film findings, she does have mild arthritis in the right hip but I do not feel that she is at the point of needing hip replacement surgery yet.  Also with a BMI of 49, we would be prohibited with being able to perform the surgery until she has a significant weight loss and gets her BMI closer to 40.  I have recommended outpatient physical therapy and even considering a intra-articular steroid injection in the right hip.  She will let us know if she would like to have that performed.  She is also a candidate for referral to Dr. Leafy Ro with the [facility] bariatric clinic but she would like to see how she does on her own first.  All questions and concerns were answered and addressed.  Follow-up is as needed.  Follow-Up Instructions: Return if symptoms worsen or fail to improve.   Orders:  No orders of the defined types were placed in this encounter.  No orders of the defined types were placed in this encounter.     Procedures: No procedures performed   Clinical Data: No additional findings.   Subjective: Chief Complaint  Patient presents with  . Right Hip - Pain  The patient is a very pleasant 69 year old female who is referred from Dr. Dorcas Mcmurray to evaluate and treat right hip pain.  The patient reports about a 2 to 3-year history of hip pain.  She says not a constant pain but if she sits too long gets up she feels like she is much older and it hurts definitely some in her groin on the right side.  She reports is a little bit of left hip pain.  She said she is a borderline diabetic and does not have any  significant medical issues.  Her BMI though is 49.  She has been sent to a nutritionist.  She does have a home exercise program to work on hip strengthening.  She denies any injury to that hip and has never had hip surgery.  HPI  Review of Systems She currently denies any headache, chest pain, shortness of breath, fever, chills, nausea, vomiting  Objective: Vital Signs: There were no vitals taken for this visit.  Physical Exam She is alert and orient x3 and in no acute distress Ortho Exam Examination of her right hip shows the move smoothly and fluidly with a little bit of pain in the groin.  Her left hip also moves smoothly and fluidly. Specialty Comments:  No specialty comments available.  Imaging: No results found. X-rays of the canopy system reviewed of both hips.  Her right hip shows just some mild arthritic changes.  The joint space is still well-maintained.  PMFS History: Patient Active Problem List   Diagnosis Date Noted  . Right hip pain 01/28/2020  . Gait abnormality 01/20/2020  . Gastroesophageal reflux disease without esophagitis 07/15/2018  . Left leg pain 07/15/2018  . Need for immunization  against influenza 01/28/2018  . Prediabetes 01/19/2017  . Lipoma 09/01/2013  . Personal history of colonic polyps- serrated adenoma 05/07/2013  . History of gastric ulcer 02/20/2013  . Preventative health care 07/18/2010  . Discomfort of right groin 03/14/2010  . Hyperlipidemia 05/22/2006  . Body mass index (BMI) of 45.0 to 49.9 in adult ([place]) 05/22/2006  . History of iron deficiency anemia 05/22/2006  . Essential hypertension 05/22/2006   Past Medical History:  Diagnosis Date  . Acanthosis nigricans   . Allergic rhinitis   . Asthma    in past  . Colonic polyp    Hyperplastic, no adenomatous or malignant changes.   . Elevated LFTs    Resolved- etiology unclear hep B, Hep C serology neg 2005  . Endometrial polyp    bemogm 12/06  . Gastric ulcer 02/20/2013  . Gastric  ulcer with hemorrhage 02/20/2013  . Hx of abnormal cervical Pap smear    1980 normal in 2006.   Marland Kitchen Hyperlipidemia   . Hypertension   . Iron deficiency anemia    postmenopausal bleeding followed by Dr. Kalman Shan.  Hx of benign endometrial polyp.   . Lumbar back pain   . Obesity    morbid obesity  . Premenopausal menorrhagia   . Right knee pain     Family History  Problem Relation Age of Onset  . Hypertension Mother   . Breast cancer Mother   . Alcohol abuse Father   . Hypertension Father   . Heart disease Father   . Gout Father   . Hypertension Sister   . Hypertension Sister     Past Surgical History:  Procedure Laterality Date  . COLONOSCOPY    . COLONOSCOPY N/A 05/05/2013   Procedure: COLONOSCOPY;  Surgeon: Gatha Mayer, MD;  Location: [facility];  Service: Endoscopy;  Laterality: N/A;  . ESOPHAGOGASTRODUODENOSCOPY N/A 02/20/2013   Procedure: ESOPHAGOGASTRODUODENOSCOPY (EGD);  Surgeon: Milus Banister, MD;  Location: [place];  Service: Endoscopy;  Laterality: N/A;  . ESOPHAGOGASTRODUODENOSCOPY N/A 05/05/2013   Procedure: ESOPHAGOGASTRODUODENOSCOPY (EGD);  Surgeon: Gatha Mayer, MD;  Location: [facility];  Service: Endoscopy;  Laterality: N/A;  . THERAPEUTIC ABORTION  1975   Social History   Occupational History  . Occupation: Radio broadcast assistant  . Occupation: SECRETARY/RECEPTIONIST    Employer: [employer]  Tobacco Use  . Smoking status: Former Smoker    Years: 10.00    Types: Cigarettes    Quit date: 07/18/1981    Years since quitting: 38.6  . Smokeless tobacco: Never Used  Substance and Sexual Activity  . Alcohol use: No    Alcohol/week: 0.0 standard drinks  . Drug use: No  . Sexual activity: Not on file

## 2020-03-02 ENCOUNTER — Encounter: Payer: Self-pay | Admitting: Dietician

## 2020-03-02 ENCOUNTER — Telehealth: Payer: Self-pay | Admitting: Dietician

## 2020-03-02 NOTE — Telephone Encounter (Signed)
Call to patient in response to referral for prediabetes medical nutrition therapy. I explained o her that her insurance will not cover this service except for the Medicare Diabetes Prevention Program (MDPP) and since she just finished the year long MDPP, they will not cover it again. I explained her options and she asked that I mail them to her. She found the MDPP to be helpful and verbalized that she is still working on some barriers to healthful eating and increasing her physical activity.

## 2020-04-20 ENCOUNTER — Encounter: Payer: Self-pay | Admitting: Internal Medicine

## 2020-04-20 ENCOUNTER — Other Ambulatory Visit: Payer: Self-pay

## 2020-04-20 ENCOUNTER — Ambulatory Visit (INDEPENDENT_AMBULATORY_CARE_PROVIDER_SITE_OTHER): Payer: Medicare Other | Admitting: Internal Medicine

## 2020-04-20 VITALS — BP 141/94 | HR 76 | Ht 65.0 in | Wt 292.9 lb

## 2020-04-20 DIAGNOSIS — K219 Gastro-esophageal reflux disease without esophagitis: Secondary | ICD-10-CM | POA: Diagnosis not present

## 2020-04-20 DIAGNOSIS — R7303 Prediabetes: Secondary | ICD-10-CM | POA: Diagnosis not present

## 2020-04-20 DIAGNOSIS — Z6841 Body Mass Index (BMI) 40.0 and over, adult: Secondary | ICD-10-CM

## 2020-04-20 DIAGNOSIS — I10 Essential (primary) hypertension: Secondary | ICD-10-CM

## 2020-04-20 DIAGNOSIS — E785 Hyperlipidemia, unspecified: Secondary | ICD-10-CM

## 2020-04-20 DIAGNOSIS — Z23 Encounter for immunization: Secondary | ICD-10-CM | POA: Diagnosis not present

## 2020-04-20 LAB — GLUCOSE, CAPILLARY: Glucose-Capillary: 107 mg/dL — ABNORMAL HIGH (ref 70–99)

## 2020-04-20 LAB — POCT GLYCOSYLATED HEMOGLOBIN (HGB A1C): Hemoglobin A1C: 6.3 % — AB (ref 4.0–5.6)

## 2020-04-20 MED ORDER — PANTOPRAZOLE SODIUM 20 MG PO TBEC: 20.0000 mg | DELAYED_RELEASE_TABLET | Freq: Every day | ORAL | 0 refills | Status: DC

## 2020-04-20 NOTE — Assessment & Plan Note (Signed)
Hemoglobin A1c 6.33 months ago.  Patient says her and her daughter never discussed starting Metformin. She has lost 8 pounds since her last visit and is eating healthier.  Assessment: History of prediabetes Plan: Check A1c today

## 2020-04-20 NOTE — Patient Instructions (Addendum)
Thank you, Ms.Theora Gianotti for allowing [place] to provide your care today. Today we discussed blood pressure, gastroesophageal reflux and hyperlipidemia.    I have ordered the following labs for you:   Lab Orders     BMP8+Anion Gap     Lipid Profile     POC Hbg A1C   Tests ordered today:  none  Referrals ordered today:   Referral Orders  No referral(s) requested today     I have ordered the following medication/changed the following medications:   Stop the following medications: There are no discontinued medications.   Start the following medications: No orders of the defined types were placed in this encounter.    Follow up: 3 months    Remember: Take your Protonix 40 mg until you run out of this bottle. I sent Protonix 20 mg daily for you to take for one month. Then you can stop taking the medication. Call if you have heartburn and we can discuss restarting the medication if needed.  Bring your blood pressure machine to the next visit. Let me know if your blood pressure is running consistently above 130/80  I will call with the results of your lab work when they return.   Should you have any questions or concerns please call the internal medicine clinic at [phone].      Tamsen Snider, M.D. [place]

## 2020-04-20 NOTE — Assessment & Plan Note (Signed)
BP Readings from Last 3 Encounters:  04/20/20 (!) 141/94  01/28/20 126/83  01/20/20 126/66    Patient's BP elevated today.  Reports she has had a recent loss in her family and believes this is because her blood pressure to be elevated.  She has lost weight since her last visit and encouraged to continue the healthy lifestyle changes she has made.  At her next visit I recommended bringing her sphygmomanometer for calibration.  Plan: HCTZ 25 mg daily BMP today

## 2020-04-20 NOTE — Assessment & Plan Note (Signed)
Patient has lost 8 pounds since her last visit 3 months ago.  I was very excited and encouraged her to continue her healthy lifestyle choices.  She is portioning her meals and eating a balanced diet.  She is exercising intermediately and stays active taking care of her dog.  She would like to join the [facility] and has been given information previously by Debera Lat, RD

## 2020-04-20 NOTE — Assessment & Plan Note (Signed)
Patient has been on Protonix for 3 years and currently takes 40 mg daily.  She denies any heartburn or abdominal pain.  She was started on this medication when she had an ulcer, negative for H.Pylori.  Assessment: GERD without esophagitis, patient has been on a PPI for 3 years and plan to deprescribe.  Plan:  -Continue 40 mg Protonix daily until she finishes this months prescription, next month Protonix 20 mg daily and then stop Protonix.  Given instructions if she has return of symptoms we will make medication changes

## 2020-04-20 NOTE — Progress Notes (Signed)
Internal Medicine Clinic Attending  Case discussed with Dr. Steen  At the time of the visit.  We reviewed the resident's history and exam and pertinent patient test results.  I agree with the assessment, diagnosis, and plan of care documented in the resident's note.  

## 2020-04-20 NOTE — Progress Notes (Signed)
   CC: high blood pressure, GERD, and hyperlipidemia   HPI:[name] is a 69 y.o. female who presents for evaluation of hight blood pressure, GERD, and hyperlipidemia. Please see individual problem based A/P for details.  Past Medical History:  Diagnosis Date  . Acanthosis nigricans   . Allergic rhinitis   . Asthma    in past  . Colonic polyp    Hyperplastic, no adenomatous or malignant changes.   . Elevated LFTs    Resolved- etiology unclear hep B, Hep C serology neg 2005  . Endometrial polyp    bemogm 12/06  . Gastric ulcer 02/20/2013  . Gastric ulcer with hemorrhage 02/20/2013  . Hx of abnormal cervical Pap smear    1980 normal in 2006.   Marland Kitchen Hyperlipidemia   . Hypertension   . Iron deficiency anemia    postmenopausal bleeding followed by Dr. Kalman Shan.  Hx of benign endometrial polyp.   . Lumbar back pain   . Obesity    morbid obesity  . Premenopausal menorrhagia   . Right knee pain    Review of Systems:   Review of Systems  Constitutional: Positive for weight loss (intentional). Negative for chills and fever.  Gastrointestinal: Negative for abdominal pain and heartburn.  Neurological: Negative for dizziness and headaches.     Physical Exam: Vitals:   04/20/20 1318  BP: (!) 151/60  Pulse: 83  SpO2: 99%  Weight: 292 lb 14.4 oz (132.9 kg)  Height: 5\' 5"  (1.651 m)    General: NAD, nl appearance HEENT: Normocephalic, atraumatic , Conjunctiva nl  Cardiovascular: Normal rate, regular rhythm.  No murmurs, rubs, or gallops Pulmonary : Equal breath sounds, No wheezes, rales, or rhonchi Abdominal: soft, nontender,  bowel sounds present   Assessment & Plan:   See Encounters Tab for problem based charting.  Patient discussed with Dr. Philipp Ovens

## 2020-04-21 LAB — LIPID PANEL
Chol/HDL Ratio: 3.5 ratio (ref 0.0–4.4)
Cholesterol, Total: 207 mg/dL — ABNORMAL HIGH (ref 100–199)
HDL: 59 mg/dL (ref >39)
LDL Chol Calc (NIH): 128 mg/dL — ABNORMAL HIGH (ref 0–99)
Triglycerides: 112 mg/dL (ref 0–149)
VLDL Cholesterol Cal: 20 mg/dL (ref 5–40)

## 2020-04-21 LAB — BMP8+ANION GAP
Anion Gap: 17 mmol/L (ref 10.0–18.0)
BUN/Creatinine Ratio: 13 (ref 12–28)
BUN: 13 mg/dL (ref 8–27)
CO2: 24 mmol/L (ref 20–29)
Calcium: 9.6 mg/dL (ref 8.7–10.3)
Chloride: 101 mmol/L (ref 96–106)
Creatinine, Ser: 1.02 mg/dL — ABNORMAL HIGH (ref 0.57–1.00)
GFR calc Af Amer: 66 mL/min/{1.73_m2} (ref >59)
GFR calc non Af Amer: 57 mL/min/{1.73_m2} — ABNORMAL LOW (ref >59)
Glucose: 105 mg/dL — ABNORMAL HIGH (ref 65–99)
Potassium: 3.9 mmol/L (ref 3.5–5.2)
Sodium: 142 mmol/L (ref 134–144)

## 2020-04-25 ENCOUNTER — Telehealth: Payer: Self-pay | Admitting: Internal Medicine

## 2020-04-25 NOTE — Telephone Encounter (Signed)
Called and updated Ms. Winterhalter on her recent lab results.  We made no medication changes today. Continue Pravachol 40 mg, moderate intensity statin  The 10-year ASCVD risk score Mikey Bussing DC Jr., et al., 2013) is: 12.8%   Values used to calculate the score:     Age: 69 years     Sex: Female     Is Non-Hispanic African American: Yes     Diabetic: No     Tobacco smoker: No     Systolic Blood Pressure: 314 mmHg     Is BP treated: Yes     HDL Cholesterol: 59 mg/dL     Total Cholesterol: 207 mg/dL

## 2020-05-22 ENCOUNTER — Other Ambulatory Visit: Payer: Self-pay | Admitting: Internal Medicine

## 2020-05-22 DIAGNOSIS — K219 Gastro-esophageal reflux disease without esophagitis: Secondary | ICD-10-CM

## 2020-06-13 DIAGNOSIS — H2513 Age-related nuclear cataract, bilateral: Secondary | ICD-10-CM | POA: Diagnosis not present

## 2020-06-13 DIAGNOSIS — H5203 Hypermetropia, bilateral: Secondary | ICD-10-CM | POA: Diagnosis not present

## 2020-08-22 ENCOUNTER — Other Ambulatory Visit: Payer: Self-pay

## 2020-08-22 DIAGNOSIS — K219 Gastro-esophageal reflux disease without esophagitis: Secondary | ICD-10-CM

## 2020-08-22 MED ORDER — PANTOPRAZOLE SODIUM 20 MG PO TBEC: 20.0000 mg | DELAYED_RELEASE_TABLET | Freq: Every day | ORAL | 0 refills | Status: DC

## 2020-08-30 ENCOUNTER — Ambulatory Visit (INDEPENDENT_AMBULATORY_CARE_PROVIDER_SITE_OTHER): Payer: Medicare Other | Admitting: Student

## 2020-08-30 ENCOUNTER — Other Ambulatory Visit: Payer: Self-pay

## 2020-08-30 ENCOUNTER — Encounter: Payer: Self-pay | Admitting: Student

## 2020-08-30 DIAGNOSIS — Z6841 Body Mass Index (BMI) 40.0 and over, adult: Secondary | ICD-10-CM

## 2020-08-30 DIAGNOSIS — M79672 Pain in left foot: Secondary | ICD-10-CM

## 2020-08-30 DIAGNOSIS — I10 Essential (primary) hypertension: Secondary | ICD-10-CM | POA: Diagnosis not present

## 2020-08-30 DIAGNOSIS — K219 Gastro-esophageal reflux disease without esophagitis: Secondary | ICD-10-CM | POA: Diagnosis not present

## 2020-08-30 MED ORDER — HYDROCHLOROTHIAZIDE 25 MG PO TABS
ORAL_TABLET | ORAL | 3 refills | Status: DC
Start: 2020-08-30 — End: 2021-09-04

## 2020-08-30 NOTE — Patient Instructions (Addendum)
[name],   Thank you for your visit to the [facility] today. It was a pleasure meeting you. Today we discussed the following:  1) Hypertension: Your blood pressure was good today! And it looks like your home measurements match up with today's reading, so that's reassuring.  - I refilled your HCTZ 25 mg daily  2) Acid reflux: Continue tapering your Protonix. If after you stop it completely, you have return of symptoms, try over the counter Tums or Pepcid for symptom relief.  3) Lifestyle changes - Awesome job with the dietary choices you are making! Keep it up. - Check out what sort of exercise programs your insurance has to offer.  4) Left foot swelling, pain - It is reassuring the pain and swelling are improving. Continue rest, icing, elevating the foot. Try ibuprofen or acetaminophen. If it does not continue to get better in the next week or so, let us know.   Schedule follow-up with your primary care doctor, Dr. Court Joy, in about 6 months. Please bring all of your medications with you.   If you have any questions or concerns, please call our clinic at [phone] between 9am-5pm. Outside of these hours, call [phone] and ask for the internal medicine resident on call. If you feel you are having a medical emergency please call 911.

## 2020-08-30 NOTE — Progress Notes (Signed)
   CC: BP check, left foot pain  HPI:  Ms.Thanya J Qualls is a 70 y.o. woman with history as below who presents to clinic for HTN follow-up and report of left foot pain. Her last clinic visit was on 04/20/20 with Dr. Court Joy.   To see the details of this patient's management of their acute and chronic problems, please refer to the Assessment & Plan under the Encounters tab.    Past Medical History:  Diagnosis Date  . Acanthosis nigricans   . Allergic rhinitis   . Asthma    in past  . Colonic polyp    Hyperplastic, no adenomatous or malignant changes.   . Elevated LFTs    Resolved- etiology unclear hep B, Hep C serology neg 2005  . Endometrial polyp    bemogm 12/06  . Gastric ulcer 02/20/2013  . Gastric ulcer with hemorrhage 02/20/2013  . Hx of abnormal cervical Pap smear    1980 normal in 2006.   Marland Kitchen Hyperlipidemia   . Hypertension   . Iron deficiency anemia    postmenopausal bleeding followed by Dr. Kalman Shan.  Hx of benign endometrial polyp.   . Lumbar back pain   . Obesity    morbid obesity  . Premenopausal menorrhagia   . Right knee pain    Review of Systems:    Review of Systems  Constitutional: Negative for chills, fever and malaise/fatigue.  Eyes: Negative for blurred vision.  Respiratory: Negative for shortness of breath.   Cardiovascular: Negative for chest pain and palpitations.  Gastrointestinal: Negative for abdominal pain.  Genitourinary: Negative for dysuria.  Musculoskeletal: Positive for joint pain.  Neurological: Negative for dizziness, weakness and headaches.    Physical Exam:  Vitals:   08/30/20 1005 08/30/20 1009  BP: 135/90 131/83  Pulse: 80 75  Temp: 99.1 F (37.3 C)   TempSrc: Oral   SpO2: 98%   Weight: 290 lb 9.6 oz (131.8 kg)   Height: 5\' 5"  (1.651 m)    Constitutional: pleasant, well-appearing woman sitting in chair, in no acute distress HENT: normocephalic atraumatic, mucous membranes moist Eyes: conjunctiva non-erythematous Neck:  supple Cardiovascular: regular rate and rhythm, no m/r/g, trace left ankle edema Pulmonary/Chest: normal work of breathing on room air, lungs clear to auscultation bilaterally Abdominal: soft, non-distended MSK: normal bulk and tone; trace left ankle edema, mild tenderness to palpation of lateral aspect of left foot Neurological: alert & oriented x 3 Skin: warm and dry Psych: normal mood and affect   Assessment & Plan:   See Encounters Tab for problem-based charting.  Patient discussed with Dr. Heber Oso

## 2020-08-31 DIAGNOSIS — M79672 Pain in left foot: Secondary | ICD-10-CM | POA: Insufficient documentation

## 2020-08-31 NOTE — Assessment & Plan Note (Signed)
Patient reports acute onset of left ankle/foot swelling about 6 days ago. She notes something similar happened a few years ago and got better with rest, antiinflammatory, and time. She reports she has been doing Epsom salt soaks, keeping weight off the foot, and it has gotten gradually better. She denies preceding trauma, fever, chills. On exam, there is trace swelling in the left ankle and mild tenderness to palpation of lateral left foot.   A/P: Possible mild sprain of unclear etiology. Reassuring it is getting better with conservative measures. No indication for imaging today. Counseled patient that if it does not get better in the next few days, to let us know. Counseled her to try ibuprofen, acetaminophen as well.

## 2020-08-31 NOTE — Assessment & Plan Note (Signed)
Patient down 2 lb since last visit 3 months ago. She is very excited to report her adherence to diet changes and describes her favorite meals to me (steel cut oats, salad, etc). She is pleased with how she is feeling overall. She does note with sadness that since her dog died in 06-01-2020 she has not been getting out of the house as much, however she notes she does stay active around the house. I suggested she look into exercise programs which her insurance may cover/include, and she states she will look into this.

## 2020-08-31 NOTE — Assessment & Plan Note (Signed)
Patient reports she is currently working through the last of her Protonix 20 mg prescription. Reports she has had minimal reflux symptoms, noting she avoids her known triggers.  Plan: - continue Protonix 20 mg daily until run out, then stop Protonix. Counseled patient if after she stops she has reflux she can first try Tums and/or OTC Pepcid

## 2020-08-31 NOTE — Assessment & Plan Note (Signed)
Patient presents for blood pressure follow-up after her BP was uncharacteristically elevated (141/94) at last visit (04/20/20). She was instructed to monitor her BP daily and bring her home cuff at this follow-up visit. She reports adherence to HCTZ 25 mg daily.  Today, her in-office BP is 131/83. On review of her recorded home BP, her BP have ranged 120-135/70s-80s. She demonstrates taking her BP with her home cuff. She uses the cuff on her left forearm instead of upper arm and measures 154/88, which is not consistent with her home measurement this morning or the clinic measurement. On repeat using the cuff on the upper arm, it is similarly elevated.  A/P: BP well-controlled at today's visit. I cannot reconcile the different measurement using her home cuff in the office, however I am reassured that her average home measurements align with ours.  - continue HCTZ 25 mg daily

## 2020-09-02 NOTE — Progress Notes (Signed)
Internal Medicine Clinic Attending  Case discussed with Dr. Watson  At the time of the visit.  We reviewed the resident's history and exam and pertinent patient test results.  I agree with the assessment, diagnosis, and plan of care documented in the resident's note.  

## 2020-09-08 ENCOUNTER — Other Ambulatory Visit: Payer: Self-pay | Admitting: Internal Medicine

## 2020-09-08 DIAGNOSIS — Z1231 Encounter for screening mammogram for malignant neoplasm of breast: Secondary | ICD-10-CM

## 2020-10-31 ENCOUNTER — Ambulatory Visit
Admission: RE | Admit: 2020-10-31 | Discharge: 2020-10-31 | Disposition: A | Discharge status: Home / Self Care | Payer: Medicare Other | Source: Ambulatory Visit

## 2020-10-31 ENCOUNTER — Other Ambulatory Visit: Payer: Self-pay

## 2020-10-31 DIAGNOSIS — Z1231 Encounter for screening mammogram for malignant neoplasm of breast: Secondary | ICD-10-CM

## 2020-12-05 ENCOUNTER — Encounter: Payer: Self-pay | Admitting: *Deleted

## 2021-01-05 ENCOUNTER — Telehealth: Payer: Self-pay | Admitting: Registered"

## 2021-01-05 NOTE — Telephone Encounter (Signed)
Dietitian called pt regarding upcoming grant sponsored DPP as pt had voiced interest in trying program again due to pt's [employer] cohort being partially disrupted by covid in 2020. Pt reports she feels she is doing well with her eating and activity at this time and doesn't feel she needs to join the program now. Pt wants to see how things go through the rest of this year and will contact office if she changes her mind. Dietitian praised pt for continuing healthy habits and encouraged her to reach out if needed.

## 2021-02-20 ENCOUNTER — Ambulatory Visit (INDEPENDENT_AMBULATORY_CARE_PROVIDER_SITE_OTHER): Payer: Medicare Other | Admitting: Internal Medicine

## 2021-02-20 ENCOUNTER — Encounter: Payer: Self-pay | Admitting: Internal Medicine

## 2021-02-20 VITALS — BP 143/88 | HR 80 | Temp 98.8°F | Ht 65.5 in | Wt 284.4 lb

## 2021-02-20 DIAGNOSIS — R7303 Prediabetes: Secondary | ICD-10-CM

## 2021-02-20 DIAGNOSIS — I10 Essential (primary) hypertension: Secondary | ICD-10-CM | POA: Diagnosis not present

## 2021-02-20 DIAGNOSIS — N950 Postmenopausal bleeding: Secondary | ICD-10-CM

## 2021-02-20 DIAGNOSIS — E7800 Pure hypercholesterolemia, unspecified: Secondary | ICD-10-CM | POA: Diagnosis not present

## 2021-02-20 LAB — POCT GLYCOSYLATED HEMOGLOBIN (HGB A1C): Hemoglobin A1C: 6.2 % — AB (ref 4.0–5.6)

## 2021-02-20 LAB — GLUCOSE, CAPILLARY: Glucose-Capillary: 99 mg/dL (ref 70–99)

## 2021-02-20 LAB — PROTIME-INR
INR: 1 (ref 0.8–1.2)
Prothrombin Time: 13.5 seconds (ref 11.4–15.2)

## 2021-02-20 NOTE — Progress Notes (Signed)
   CC: Hypertension, Hyperlipidemia, Prediabetes,Post menopausal bleeding  HPI:[name] is a 70 y.o. female who presents for evaluation of HTN, HLD, Prediabetes, and post menopausal bleeding. Please see individual problem based A/P for details.  Past Medical History:  Diagnosis Date   Acanthosis nigricans    Allergic rhinitis    Asthma    in past   Colonic polyp    Hyperplastic, no adenomatous or malignant changes.    Elevated LFTs    Resolved- etiology unclear hep B, Hep C serology neg 2005   Endometrial polyp    bemogm 12/06   Gastric ulcer 02/20/2013   Gastric ulcer with hemorrhage 02/20/2013   Hx of abnormal cervical Pap smear    1980 normal in 2006.    Hyperlipidemia    Hypertension    Iron deficiency anemia    postmenopausal bleeding followed by Dr. Kalman Shan.  Hx of benign endometrial polyp.    Lumbar back pain    Obesity    morbid obesity   Premenopausal menorrhagia    Right knee pain    Review of Systems:   Review of Systems  Constitutional:  Negative for chills and fever.  Neurological:  Negative for dizziness and headaches.  Endo/Heme/Allergies:  Positive for polydipsia. Does not bruise/bleed easily.    Physical Exam: Vitals:   02/20/21 1340  BP: (!) 143/88  Pulse: 80  Temp: 98.8 F (37.1 C)  TempSrc: Oral  SpO2: 97%  Weight: 284 lb 6.4 oz (129 kg)  Height: 5' 5.5" (1.664 m)    General: obese, NAD HEENT: Conjunctiva nl , antiicteric sclerae, moist mucous membranes, no exudate or erythema Cardiovascular: Normal rate, regular rhythm.  No murmurs, rubs, or gallops Pulmonary : Equal breath sounds, No wheezes, rales, or rhonchi Abdominal: soft, nontender,  bowel sounds present Ext: No edema in lower extremities, no tenderness to palpation of lower extremities.   Assessment & Plan:   See Encounters Tab for problem based charting.  Patient discussed with Dr. Evette Doffing

## 2021-02-20 NOTE — Patient Instructions (Signed)
Thank you for trusting me with your care. To recap, today we discussed the following:  1. Essential hypertension - CMP14 + Anion Gap - Lipid Profile  2. Pure hypercholesterolemia - Lipid Profile  3. Prediabetes - POC Hbg A1C  4. Post menopausal bleeding - follow up in one week for pelvic exam and we will send you for pelvic ultrasound after exam.

## 2021-02-21 ENCOUNTER — Encounter: Payer: Self-pay | Admitting: Internal Medicine

## 2021-02-21 DIAGNOSIS — N950 Postmenopausal bleeding: Secondary | ICD-10-CM | POA: Insufficient documentation

## 2021-02-21 LAB — LIPID PANEL
Chol/HDL Ratio: 3.5 ratio (ref 0.0–4.4)
Cholesterol, Total: 204 mg/dL — ABNORMAL HIGH (ref 100–199)
HDL: 59 mg/dL (ref >39)
LDL Chol Calc (NIH): 126 mg/dL — ABNORMAL HIGH (ref 0–99)
Triglycerides: 106 mg/dL (ref 0–149)
VLDL Cholesterol Cal: 19 mg/dL (ref 5–40)

## 2021-02-21 LAB — CBC WITH DIFFERENTIAL/PLATELET
Basophils Absolute: 0 10*3/uL (ref 0.0–0.2)
Basos: 1 %
EOS (ABSOLUTE): 0.2 10*3/uL (ref 0.0–0.4)
Eos: 3 %
Hematocrit: 38.3 % (ref 34.0–46.6)
Hemoglobin: 12.2 g/dL (ref 11.1–15.9)
Immature Grans (Abs): 0 10*3/uL (ref 0.0–0.1)
Immature Granulocytes: 0 %
Lymphocytes Absolute: 2.1 10*3/uL (ref 0.7–3.1)
Lymphs: 32 %
MCH: 25.8 pg — ABNORMAL LOW (ref 26.6–33.0)
MCHC: 31.9 g/dL (ref 31.5–35.7)
MCV: 81 fL (ref 79–97)
Monocytes Absolute: 0.5 10*3/uL (ref 0.1–0.9)
Monocytes: 7 %
Neutrophils Absolute: 3.8 10*3/uL (ref 1.4–7.0)
Neutrophils: 57 %
Platelets: 338 10*3/uL (ref 150–450)
RBC: 4.72 x10E6/uL (ref 3.77–5.28)
RDW: 14.6 % (ref 11.7–15.4)
WBC: 6.6 10*3/uL (ref 3.4–10.8)

## 2021-02-21 LAB — CMP14 + ANION GAP
ALT: 13 IU/L (ref 0–32)
AST: 23 IU/L (ref 0–40)
Albumin/Globulin Ratio: 1.7 (ref 1.2–2.2)
Albumin: 4.2 g/dL (ref 3.8–4.8)
Alkaline Phosphatase: 96 IU/L (ref 44–121)
Anion Gap: 17 mmol/L (ref 10.0–18.0)
BUN/Creatinine Ratio: 13 (ref 12–28)
BUN: 12 mg/dL (ref 8–27)
Bilirubin Total: 0.5 mg/dL (ref 0.0–1.2)
CO2: 24 mmol/L (ref 20–29)
Calcium: 9 mg/dL (ref 8.7–10.3)
Chloride: 100 mmol/L (ref 96–106)
Creatinine, Ser: 0.9 mg/dL (ref 0.57–1.00)
Globulin, Total: 2.5 g/dL (ref 1.5–4.5)
Glucose: 95 mg/dL (ref 65–99)
Potassium: 4.1 mmol/L (ref 3.5–5.2)
Sodium: 141 mmol/L (ref 134–144)
Total Protein: 6.7 g/dL (ref 6.0–8.5)
eGFR: 70 mL/min/{1.73_m2} (ref >59)

## 2021-02-21 MED ORDER — ROSUVASTATIN CALCIUM 20 MG PO TABS: 20.0000 mg | ORAL_TABLET | Freq: Every day | ORAL | 2 refills | Status: DC

## 2021-02-21 NOTE — Assessment & Plan Note (Addendum)
Patient currently taking pravastatin 40 mg daily. Last lipid panel 2019. Will check lipid panel today.  Addendum:  The 10-year ASCVD risk score (Arnett DK, et al., 2019) is: 13.7%.  Lipid Panel     Component Value Date/Time   CHOL 204 (H) 02/20/2021 1424   TRIG 106 02/20/2021 1424   HDL 59 02/20/2021 1424   CHOLHDL 3.5 02/20/2021 1424   CHOLHDL 3.5 11/30/2013 0946   VLDL 22 11/30/2013 0946   LDLCALC 126 (H) 02/20/2021 1424   LABVLDL 19 02/20/2021 1424   Stopped pravastatin. Starting Crestor. Patient may finish this months pravastatin before starting Crestor. I will check lipid panel in 3 months at her next visit.

## 2021-02-21 NOTE — Assessment & Plan Note (Signed)
[name] presented today for routine clinic visit. She had acute concern of post menopausal bleeding. Reports her last menstrual cycle was approximately 15 years ago, but last week she had a menstrual cycle which last 5 days. She did notice some blood clots. She is not currently having any active bleeding. Her last pap smear was in 2016, and nl.   I offered further evaluation with pelvic exam, however patient has an another appointment scheduled today and would like to reschedule this evaluation. I asked patient to return for pelvic exam and if no source of bleeding is found I recommend transvaginal ultrasound.

## 2021-02-21 NOTE — Assessment & Plan Note (Addendum)
Lab Results  Component Value Date   HGBA1C 6.2 (A) 02/20/2021   Patient is down to 284 from 290 lbs.   Assessment/Plan: Prediabetes, patient continues to make good progress with weight loss. Continue to encourage exercise and healthy diet.

## 2021-02-21 NOTE — Assessment & Plan Note (Addendum)
BP Readings from Last 3 Encounters:  02/20/21 (!) 143/88  08/30/20 131/83  04/20/20 (!) 141/94    Patient blood pressures is above goal. She continues to work on lifestyle modification and reports her SBP is < 130 on blood pressure monitoring. She does not have her blood pressure cough for calibration against our machine. She would not like further treatment with medications.   Assessment/Plan: HTN, controlled on home monitor.  - Continue HCTZ 25 mg daily - Check labs today  Addendum: Labs wnl

## 2021-02-22 NOTE — Progress Notes (Signed)
Internal Medicine Clinic Attending  Case discussed with Dr. Steen  At the time of the visit.  We reviewed the resident's history and exam and pertinent patient test results.  I agree with the assessment, diagnosis, and plan of care documented in the resident's note.  

## 2021-03-06 ENCOUNTER — Other Ambulatory Visit (HOSPITAL_COMMUNITY)
Admission: RE | Admit: 2021-03-06 | Discharge: 2021-03-06 | Disposition: A | Discharge status: Home / Self Care | Payer: Medicare Other | Source: Ambulatory Visit | Attending: Internal Medicine | Admitting: Internal Medicine

## 2021-03-06 ENCOUNTER — Ambulatory Visit (INDEPENDENT_AMBULATORY_CARE_PROVIDER_SITE_OTHER): Payer: Medicare Other | Admitting: Internal Medicine

## 2021-03-06 ENCOUNTER — Encounter: Payer: Self-pay | Admitting: Internal Medicine

## 2021-03-06 VITALS — BP 156/87 | HR 80 | Wt 286.0 lb

## 2021-03-06 DIAGNOSIS — Z01419 Encounter for gynecological examination (general) (routine) without abnormal findings: Secondary | ICD-10-CM | POA: Diagnosis not present

## 2021-03-06 DIAGNOSIS — N950 Postmenopausal bleeding: Secondary | ICD-10-CM | POA: Diagnosis present

## 2021-03-06 NOTE — Progress Notes (Addendum)
   CC: Postmenopausal bleeding  HPI:  [name] is a 70 y.o. PMH noted below, who presents to the [facility] with complaints of post-menopausal bleeding. To see the management of his acute and chronic conditions, please refer to the A&P note under the encounters tab.   Past Medical History:  Diagnosis Date   Acanthosis nigricans    Allergic rhinitis    Asthma    in past   Colonic polyp    Hyperplastic, no adenomatous or malignant changes.    Elevated LFTs    Resolved- etiology unclear hep B, Hep C serology neg 2005   Endometrial polyp    bemogm 12/06   Gastric ulcer 02/20/2013   Gastric ulcer with hemorrhage 02/20/2013   Hx of abnormal cervical Pap smear    1980 normal in 2006.    Hyperlipidemia    Hypertension    Iron deficiency anemia    postmenopausal bleeding followed by Dr. Kalman Shan.  Hx of benign endometrial polyp.    Lumbar back pain    Obesity    morbid obesity   Premenopausal menorrhagia    Right knee pain    Review of Systems:  Positive for vaginal bleeding, otherwise negative  Physical Exam: Gen: Obese appearing woman in  NAD HEENT:Normocephalic atraumatic, neck supple, sclerae anicteric CV: RRR Resp: normal WOB on room air, speaking in complete sentences GI: non-tender, soft, distended GU: round, skin colored papule on the right labia majora, possible polyp near the urethra/vaginal introitus, cervix visualized with scant old blood in the vaginal vault, no abnormal discharge, cervix friable on pap Neuro: alert, answering questions appropriately MSK: moves extremities spontaneously, normal gait Skin:warm and dry Psych:normal affect   Assessment & Plan:   See Encounters Tab for problem based charting.  Patient seen with Dr. Saverio Danker

## 2021-03-06 NOTE — Progress Notes (Signed)
Internal Medicine Clinic Attending  I saw and evaluated the patient with the resident.  I personally confirmed the key portions of the history and exam documented by Dr. Ileene Musa and I reviewed pertinent patient test results.  The assessment, diagnosis, and plan were formulated together and I agree with the documentation in the resident's note. Cervix and vagina normal on visual exam, although friable cervix and evidence of small amount of old blood near the introitus. Small, pedunculated mass between introital opening and urethra. Skin-colored nodule of labia also noted. Will review Pap results and send for pelvic US. Referral to gynecology for further evaluation of exam findings, possible biopsy, and consideration of endometrial biopsy.

## 2021-03-06 NOTE — Assessment & Plan Note (Addendum)
Patient reports bleeding 1 year ago and then again three months ago. Not currently sexually active and does not have a personal history of any type of breast or gynecologic cancer. Does have two first degree relatives with breast cancer. Pelvic exam identified a possible polyp near the urethral opening, no cervical polyp identified by scant old blood was noted in the vaginal vault as well as a mildly friable cervix. Obtained a pap smear today which showed no evidence of malgnancy. Order placed to obtain a transvaginal ultrasound. Called patient to let her know the results of her pap smear were normal, but requested that she get the ultrasound appointment scheduled. Will also refer patient out for a gynecology appointment to further assess the possible urethral vs introital polyp. Follow up in three months or sooner if problem worsens  Addendum: Transvaginal ultrasound showed thickened endometrium. Called patient to discuss results and next steps of biopsy. Will resend referral for Obgyn as patient has not yet been able to schedule an appointment.

## 2021-03-06 NOTE — Patient Instructions (Addendum)
[name]  It was a pleasure seeing you in the clinic today.   We did a pap smear and a pelvic exam today in clinic. We will call you with the results when they come back.   Post-menopausal bleeding We did a pap smear today to look for any abnormal cells on your cervix. We will also place a referral for you to get an ultrasound to look at your pelvic anatomy and to make sure we don't see anything concerning for something like cancer. We are also going to recommend that you follow up with a gynecologist to discuss the results. We will place that referral for you.   Please call our clinic at [phone] if you have any questions or concerns. The best time to call is Monday-Friday from 9am-4pm, but there is someone available 24/7 at the same number. If you need medication refills, please notify your pharmacy one week in advance and they will send [place] a request.   Thank you for letting us take part in your care. We look forward to seeing you next time!

## 2021-03-08 LAB — CYTOLOGY - PAP
Diagnosis: NEGATIVE
Diagnosis: REACTIVE

## 2021-03-16 ENCOUNTER — Ambulatory Visit (HOSPITAL_COMMUNITY): Payer: Medicare Other

## 2021-03-20 ENCOUNTER — Ambulatory Visit (HOSPITAL_COMMUNITY)
Admission: RE | Admit: 2021-03-20 | Discharge: 2021-03-20 | Disposition: A | Discharge status: Home / Self Care | Payer: Medicare Other | Source: Ambulatory Visit | Attending: Internal Medicine | Admitting: Internal Medicine

## 2021-03-20 ENCOUNTER — Other Ambulatory Visit: Payer: Self-pay

## 2021-03-20 DIAGNOSIS — N950 Postmenopausal bleeding: Secondary | ICD-10-CM | POA: Diagnosis not present

## 2021-03-21 NOTE — Addendum Note (Signed)
Addended by: Inda Coke on: 03/21/2021 09:15 AM   Modules accepted: Orders

## 2021-05-29 ENCOUNTER — Other Ambulatory Visit: Payer: Self-pay | Admitting: Internal Medicine

## 2021-05-29 DIAGNOSIS — E7800 Pure hypercholesterolemia, unspecified: Secondary | ICD-10-CM

## 2021-05-30 ENCOUNTER — Other Ambulatory Visit: Payer: Self-pay

## 2021-05-30 ENCOUNTER — Ambulatory Visit (INDEPENDENT_AMBULATORY_CARE_PROVIDER_SITE_OTHER): Payer: Medicare Other | Admitting: Obstetrics & Gynecology

## 2021-05-30 ENCOUNTER — Encounter: Payer: Self-pay | Admitting: Obstetrics & Gynecology

## 2021-05-30 ENCOUNTER — Other Ambulatory Visit (HOSPITAL_COMMUNITY)
Admission: RE | Admit: 2021-05-30 | Discharge: 2021-05-30 | Disposition: A | Discharge status: Home / Self Care | Payer: Medicare Other | Source: Ambulatory Visit | Attending: Obstetrics & Gynecology | Admitting: Obstetrics & Gynecology

## 2021-05-30 DIAGNOSIS — C541 Malignant neoplasm of endometrium: Secondary | ICD-10-CM | POA: Diagnosis not present

## 2021-05-30 DIAGNOSIS — N950 Postmenopausal bleeding: Secondary | ICD-10-CM | POA: Diagnosis not present

## 2021-05-30 DIAGNOSIS — R9389 Abnormal findings on diagnostic imaging of other specified body structures: Secondary | ICD-10-CM | POA: Diagnosis not present

## 2021-05-30 NOTE — Progress Notes (Signed)
Patient ID: [name], female   DOB: [birth year], 70 y.o.   MRN: [id number]  Chief Complaint  Patient presents with   Gynecologic Exam    HPI [name] is a 70 y.o. female.  Patient began to have bleeding in Sept like a normal period and since then she has had occasional spotting. US showed 11 mm endometrial stripe and she presents for endometrial biopsy. Pap done by PCP [id number] was normal. HPI  Past Medical History:  Diagnosis Date   Acanthosis nigricans    Allergic rhinitis    Asthma    in past   Colonic polyp    Hyperplastic, no adenomatous or malignant changes.    Elevated LFTs    Resolved- etiology unclear hep B, Hep C serology neg 2005   Endometrial polyp    bemogm 12/06   Gastric ulcer 02/20/2013   Gastric ulcer with hemorrhage 02/20/2013   Hx of abnormal cervical Pap smear    1980 normal in 2006.    Hyperlipidemia    Hypertension    Iron deficiency anemia    postmenopausal bleeding followed by Dr. Kalman Shan.  Hx of benign endometrial polyp.    Lumbar back pain    Obesity    morbid obesity   Premenopausal menorrhagia    Right knee pain     Past Surgical History:  Procedure Laterality Date   COLONOSCOPY     COLONOSCOPY N/A 05/05/2013   Procedure: COLONOSCOPY;  Surgeon: Gatha Mayer, MD;  Location: [facility];  Service: Endoscopy;  Laterality: N/A;   ESOPHAGOGASTRODUODENOSCOPY N/A 02/20/2013   Procedure: ESOPHAGOGASTRODUODENOSCOPY (EGD);  Surgeon: Milus Banister, MD;  Location: [facility];  Service: Endoscopy;  Laterality: N/A;   ESOPHAGOGASTRODUODENOSCOPY N/A 05/05/2013   Procedure: ESOPHAGOGASTRODUODENOSCOPY (EGD);  Surgeon: Gatha Mayer, MD;  Location: [facility];  Service: Endoscopy;  Laterality: N/A;   THERAPEUTIC ABORTION  1975    Family History  Problem Relation Age of Onset   Hypertension Mother    Breast cancer Mother    Alcohol abuse Father    Hypertension Father    Heart disease Father    Gout Father    Hypertension Sister     Hypertension Sister     Social History Social History   Tobacco Use   Smoking status: Former    Years: 10.00    Types: Cigarettes    Quit date: 07/18/1981    Years since quitting: 39.8   Smokeless tobacco: Never  Substance Use Topics   Alcohol use: No    Alcohol/week: 0.0 standard drinks   Drug use: No    No Known Allergies  Current Outpatient Medications  Medication Sig Dispense Refill   hydrochlorothiazide (HYDRODIURIL) 25 MG tablet TAKE 1 TABLET BY MOUTH DAILY. 90 tablet 3   NON FORMULARY Alive Vitamins-Take one daily     rosuvastatin (CRESTOR) 20 MG tablet TAKE ONE TABLET BY MOUTH DAILY 90 tablet 1   No current facility-administered medications for this visit.    Review of Systems Review of Systems  Constitutional: Negative.   Respiratory: Negative.    Genitourinary:  Negative for menstrual problem and pelvic pain. Vaginal bleeding: occasional spotting.  Blood pressure (!) (P) 163/90, pulse (P) 77, weight (P) 285 lb 14.4 oz (129.7 kg).  Physical Exam Physical Exam Vitals and nursing note reviewed. Exam conducted with a chaperone present.  Constitutional:      Appearance: She is obese. She is not ill-appearing.  Abdominal:     Palpations: Abdomen is soft.  Genitourinary:    General: Normal vulva.     Exam position: Lithotomy position.     Vagina: Normal. Vaginal discharge: scant spotting.     Cervix: Normal. Cervical bleeding: small amount.  Skin:    General: Skin is warm and dry.  Neurological:     Mental Status: She is alert.  Psychiatric:        Mood and Affect: Mood normal.        Behavior: Behavior normal.   Patient given informed consent, signed copy in the chart, time out was performed. Appropriate time out taken. . The patient was placed in the lithotomy position and the cervix brought into view with sterile speculum.  Portio of cervix cleansed x 2 with betadine swabs.  A tenaculum was placed in the anterior lip of the cervix.  The uterus was  sounded for depth of 9 cm. A pipelle was introduced to into the uterus, suction created,  and an endometrial sample was obtained. All equipment was removed and accounted for.  The patient tolerated the procedure well.    Patient given post procedure instructions. The patient will return in 2 weeks for results. Data Reviewed Narrative & Impression  CLINICAL DATA:  Post menopausal bleeding   EXAM: TRANSABDOMINAL AND TRANSVAGINAL ULTRASOUND OF PELVIS   TECHNIQUE: Both transabdominal and transvaginal ultrasound examinations of the pelvis were performed. Transabdominal technique was performed for global imaging of the pelvis including uterus, ovaries, adnexal regions, and pelvic cul-de-sac. It was necessary to proceed with endovaginal exam following the transabdominal exam to visualize the uterus endometrium ovaries.   COMPARISON:  Pelvic ultrasound 01/14/2021   FINDINGS: Uterus   Measurements: 10.3 x 5.9 x 6.6 cm = volume: 219.6 mL. No fibroids or other mass visualized.   Endometrium   Thickness: 11.5 mm. Heterogenous with increased vascularity. Poorly defined endometrial myometrial interface.   Right ovary   Not seen   Left ovary   Not seen.   Other findings   No abnormal free fluid.   IMPRESSION: Endometrial thickness of 11 point 5 mm. In the setting of post-menopausal bleeding, endometrial sampling is indicated to exclude carcinoma. If results are benign, sonohysterogram should be considered for focal lesion work-up. (Ref: Radiological Reasoning: Algorithmic Workup of Abnormal Vaginal Bleeding with Endovaginal Sonography and Sonohysterography. AJR 2008; 798:X21-19)   Nonvisualized ovaries     Electronically Signed   By: Donavan Foil M.D.   On: 03/20/2021 16:15    Assessment Post-menopausal bleeding - Plan: Surgical pathology Thickened endometrium   Plan Review pathology results F/U 4 weeks Consider progesterone Rx    [name] 05/30/2021,  10:39 AM

## 2021-06-01 LAB — SURGICAL PATHOLOGY

## 2021-06-06 DIAGNOSIS — C541 Malignant neoplasm of endometrium: Secondary | ICD-10-CM | POA: Insufficient documentation

## 2021-06-06 NOTE — Addendum Note (Signed)
Addended by: Woodroe Mode on: 06/06/2021 11:57 AM   Modules accepted: Orders

## 2021-06-17 ENCOUNTER — Telehealth: Payer: Self-pay | Admitting: Radiology

## 2021-06-17 NOTE — Telephone Encounter (Signed)
Referral and visit note faxed to [facility] for appointment scheduling. Per Dr Jordan Hawks request.

## 2021-06-18 ENCOUNTER — Telehealth: Payer: Self-pay | Admitting: *Deleted

## 2021-06-18 NOTE — Telephone Encounter (Signed)
Spoke with the patient and scheduled her for a new patient on 1/26 at 10:30 am per patient request. Patient offered an appt on 1/20 and patient declined

## 2021-06-25 ENCOUNTER — Other Ambulatory Visit: Payer: Self-pay

## 2021-06-25 ENCOUNTER — Encounter: Payer: Self-pay | Admitting: Internal Medicine

## 2021-06-25 ENCOUNTER — Ambulatory Visit (INDEPENDENT_AMBULATORY_CARE_PROVIDER_SITE_OTHER): Payer: Medicare Other | Admitting: Internal Medicine

## 2021-06-25 ENCOUNTER — Ambulatory Visit (INDEPENDENT_AMBULATORY_CARE_PROVIDER_SITE_OTHER): Payer: Medicare Other | Admitting: Obstetrics and Gynecology

## 2021-06-25 ENCOUNTER — Encounter: Payer: Self-pay | Admitting: Obstetrics and Gynecology

## 2021-06-25 VITALS — BP 159/53 | HR 68 | Temp 98.2°F | Wt 287.7 lb

## 2021-06-25 VITALS — BP 159/78 | HR 90 | Ht 65.5 in | Wt 284.1 lb

## 2021-06-25 DIAGNOSIS — C541 Malignant neoplasm of endometrium: Secondary | ICD-10-CM | POA: Diagnosis not present

## 2021-06-25 DIAGNOSIS — R7303 Prediabetes: Secondary | ICD-10-CM

## 2021-06-25 DIAGNOSIS — I10 Essential (primary) hypertension: Secondary | ICD-10-CM

## 2021-06-25 DIAGNOSIS — E785 Hyperlipidemia, unspecified: Secondary | ICD-10-CM | POA: Diagnosis not present

## 2021-06-25 LAB — POCT GLYCOSYLATED HEMOGLOBIN (HGB A1C): Hemoglobin A1C: 6.2 % — AB (ref 4.0–5.6)

## 2021-06-25 LAB — GLUCOSE, CAPILLARY: Glucose-Capillary: 95 mg/dL (ref 70–99)

## 2021-06-25 NOTE — Patient Instructions (Signed)
Dear [name],  Thank you for trusting [place] with your care today. Today we discussed your blood pressure, blood sugar, cholesterol, and cancer.   We will continue you on your current blood pressure medication today. Please bring your blood pressure cuff with you to your next office visit.  Please follow up with your OBGYN doctor later today as they will be the most important ones when managing your cancer.  We will check some blood work today and call you if any changes need to be made to your medications.   Please return to the clinic in 3 months for a blood pressure check.

## 2021-06-25 NOTE — Progress Notes (Signed)
°  CC: endometrial biopsy follow up Subjective:    Patient ID: [name], female    DOB: [birth year], 71 y.o.   MRN: [id number]  HPI 71 yo G3P2 seen for follow up from endometrial biopsy.  Pt is present with her daughter.  Reviewed last pap smear, pelvic ultrasound and endometrial biopsy findings with patient.  Pt is aware the endometrial biopsy showed endometrioid adenocarcinoma.  Pt has consultation with Gyn/Onc on 06/28/21.  Pt was reassured, but future plan of care and cancer ramifications were not explored at this time as she has gync/onc appointment pending.   Review of Systems     Objective:   Physical Exam Vitals:   06/25/21 1450  BP: (!) 159/78  Pulse: 90         Assessment & Plan:   1. Endometrial cancer determined by uterine biopsy [facility]) Pending appointment with K. Berline Lopes from gyn Oncology. Questions and concerns answered to best of ability.   I spent 20 minutes dedicated to the care of this patient including previsit review of records, face to face time with the patient discussing biopsy findings, future plan of care and post visit testing.     Griffin Basil, MD Faculty Attending, Center for [facility]

## 2021-06-25 NOTE — Progress Notes (Signed)
05/30/21-Endometrial Bx

## 2021-06-25 NOTE — Progress Notes (Signed)
° °  CC: routine check up  HPI:[name] is a 71 y.o. female who presents for evaluation of routine check up. Please see individual problem based A/P for details.    Depression, PHQ-9: Based on the patients  [place] Visit from 08/30/2020 in [place]  PHQ-9 Total Score 0      score we have 0.  Past Medical History:  Diagnosis Date   Acanthosis nigricans    Allergic rhinitis    Asthma    in past   Colonic polyp    Hyperplastic, no adenomatous or malignant changes.    Elevated LFTs    Resolved- etiology unclear hep B, Hep C serology neg 2005   Endometrial polyp    bemogm 12/06   Gastric ulcer 02/20/2013   Gastric ulcer with hemorrhage 02/20/2013   Hx of abnormal cervical Pap smear    1980 normal in 2006.    Hyperlipidemia    Hypertension    Iron deficiency anemia    postmenopausal bleeding followed by Dr. Kalman Shan.  Hx of benign endometrial polyp.    Lumbar back pain    Obesity    morbid obesity   Premenopausal menorrhagia    Right knee pain    Review of Systems:   Review of Systems  Constitutional: Negative.   HENT: Negative.    Eyes: Negative.   Respiratory: Negative.    Cardiovascular: Negative.   Gastrointestinal: Negative.   Genitourinary: Negative.   Musculoskeletal: Negative.   Skin: Negative.   Neurological: Negative.   Endo/Heme/Allergies: Negative.   Psychiatric/Behavioral: Negative.      Physical Exam: There were no vitals filed for this visit.   General: alert and oriented. obese HEENT: Conjunctiva nl , antiicteric sclerae, moist mucous membranes, no exudate or erythema Cardiovascular: Normal rate, regular rhythm.  No murmurs, rubs, or gallops Pulmonary : Equal breath sounds, No wheezes, rales, or rhonchi Abdominal: soft, nontender,  bowel sounds present Ext: No edema in lower extremities, no tenderness to palpation of lower extremities.   Assessment & Plan:   See Encounters Tab for problem based  charting.  Patient discussed with Dr. Dareen Piano

## 2021-06-26 ENCOUNTER — Encounter: Payer: Self-pay | Admitting: Gynecologic Oncology

## 2021-06-26 LAB — LIPID PANEL
Chol/HDL Ratio: 2.9 ratio (ref 0.0–4.4)
Cholesterol, Total: 163 mg/dL (ref 100–199)
HDL: 57 mg/dL (ref >39)
LDL Chol Calc (NIH): 88 mg/dL (ref 0–99)
Triglycerides: 99 mg/dL (ref 0–149)
VLDL Cholesterol Cal: 18 mg/dL (ref 5–40)

## 2021-06-28 ENCOUNTER — Inpatient Hospital Stay: Payer: Medicare Other | Attending: Gynecologic Oncology | Admitting: Gynecologic Oncology

## 2021-06-28 ENCOUNTER — Encounter: Payer: Self-pay | Admitting: Gynecologic Oncology

## 2021-06-28 ENCOUNTER — Ambulatory Visit (HOSPITAL_BASED_OUTPATIENT_CLINIC_OR_DEPARTMENT_OTHER): Payer: Medicare Other | Admitting: Gynecologic Oncology

## 2021-06-28 ENCOUNTER — Other Ambulatory Visit: Payer: Self-pay

## 2021-06-28 VITALS — BP 167/74 | HR 87 | Temp 98.3°F | Resp 17 | Wt 290.1 lb

## 2021-06-28 DIAGNOSIS — Z79899 Other long term (current) drug therapy: Secondary | ICD-10-CM | POA: Insufficient documentation

## 2021-06-28 DIAGNOSIS — C541 Malignant neoplasm of endometrium: Secondary | ICD-10-CM | POA: Diagnosis not present

## 2021-06-28 DIAGNOSIS — N950 Postmenopausal bleeding: Secondary | ICD-10-CM

## 2021-06-28 DIAGNOSIS — Z6841 Body Mass Index (BMI) 40.0 and over, adult: Secondary | ICD-10-CM | POA: Diagnosis not present

## 2021-06-28 DIAGNOSIS — E785 Hyperlipidemia, unspecified: Secondary | ICD-10-CM | POA: Insufficient documentation

## 2021-06-28 DIAGNOSIS — I10 Essential (primary) hypertension: Secondary | ICD-10-CM | POA: Insufficient documentation

## 2021-06-28 MED ORDER — TRAMADOL HCL 50 MG PO TABS: 50.0000 mg | ORAL_TABLET | Freq: Four times a day (QID) | ORAL | 0 refills | Status: DC | PRN

## 2021-06-28 MED ORDER — SENNOSIDES-DOCUSATE SODIUM 8.6-50 MG PO TABS: 2.0000 | ORAL_TABLET | Freq: Every day | ORAL | 0 refills | Status: DC

## 2021-06-28 NOTE — H&P (View-Only) (Signed)
GYNECOLOGIC ONCOLOGY NEW PATIENT CONSULTATION   Patient Name: [name]  Patient Age: 71 y.o. Date of Service: 06/28/2021 Referring Provider: [name], MD  Primary Care Provider: Madalyn Rob, MD Consulting Provider: Jeral Pinch, MD   Assessment/Plan:  Postmenopausal patient with clinical stage I grade 1 endometrioid endometrial adenocarcinoma.  We reviewed the nature of endometrial cancer and its recommended surgical staging, including total hysterectomy, bilateral salpingo-oophorectomy, and lymph node assessment. The patient is a suitable candidate for staging via a minimally invasive approach to surgery.  We reviewed that robotic assistance would be used to complete the surgery.   While I do not anticipate issues with Trendelenburg, we discussed that if she did not tolerate Trendelenburg positioning or insufflation, that my recommendation is to proceed with D&C and Mirena IUD placement for treatment of her grade 1 endometrial cancer while we aggressively work on weight loss to make definitive surgery safe.  We discussed that most endometrial cancer is detected early and that decisions regarding adjuvant therapy will be made based on her final pathology.   We reviewed the sentinel lymph node technique. Risks and benefits of sentinel lymph node biopsy was reviewed. We reviewed the technique and ICG dye. The patient DOES NOT have an iodine allergy or known liver dysfunction. We reviewed the false negative rate (0.4%), and that 3% of patients with metastatic disease will not have it detected by SLN biopsy in endometrial cancer. A low risk of allergic reaction to the dye, <0.2% for ICG, has been reported. We also discussed that in the case of failed mapping, which occurs 40% of the time, a bilateral or unilateral lymphadenectomy will be performed at the surgeons discretion.   Potential benefits of sentinel nodes including a higher detection rate for metastasis due to ultrastaging  and potential reduction in operative morbidity. However, there remains uncertainty as to the role for treatment of micrometastatic disease. Further, the benefit of operative morbidity associated with the SLN technique in endometrial cancer is not yet completely known. In other patient populations (e.g. the cervical cancer population) there has been observed reductions in morbidity with SLN biopsy compared to pelvic lymphadenectomy. Lymphedema, nerve dysfunction and lymphocysts are all potential risks with the SLN technique as with complete lymphadenectomy. Additional risks to the patient include the risk of damage to an internal organ while operating in an altered view (e.g. the black and white image of the robotic fluorescence imaging mode).   The patient was consented for a robotic assisted hysterectomy, bilateral salpingo-oophorectomy, sentinel lymph node evaluation, possible lymph node dissection, possible laparotomy, and possible D&C with Mirena IUD insertion. The risks of surgery were discussed in detail and she understands these to include infection; wound separation; hernia; vaginal cuff separation, injury to adjacent organs such as bowel, bladder, blood vessels, ureters and nerves; bleeding which may require blood transfusion; anesthesia risk; thromboembolic events; possible death; unforeseen complications; possible need for re-exploration; medical complications such as heart attack, stroke, pleural effusion and pneumonia; and, if full lymphadenectomy is performed the risk of lymphedema and lymphocyst. The patient will receive DVT and antibiotic prophylaxis as indicated. She voiced a clear understanding. She had the opportunity to ask questions. Perioperative instructions were reviewed with her. Prescriptions for post-op medications were sent to her pharmacy of choice.  We reviewed the role of progesterone therapy and the effect on preneoplastic lesions, believed to include induction of apoptosis in  addition to tissue sloughing during withdrawal bleeding.  Activation of the progesterone receptors is believed to lead stromal  decidualization and thinning of the lining.  We reviewed the 3 most studied options, to include levonorgesterol IUD (110mcg/d), oral medorxyprogesterone acetate 10mg  daily or cyclically 89-38 days per month, or oral megesterol acetate 40-200mg  per day.  She understands that all options have few side effects, most common being infrequent edema, GI disturbances, and thromboembolic events), but that local progesterone through IUD may have a stronger effect on the endometrium with less systemic side effects.    We also reviewed the importance of weight loss in overall health as well as reduction in cancer risk.    A copy of this note was sent to the patient's referring provider.   70 minutes of total time was spent for this patient encounter, including preparation, face-to-face counseling with the patient and coordination of care, and documentation of the encounter.   Jeral Pinch, MD  Division of Gynecologic Oncology  Department of Obstetrics and Gynecology  [facility]  ___________________________________________  Chief Complaint: Chief Complaint  Patient presents with   Endometrial cancer, grade I ([place])    History of Present Illness:  [name] is a 71 y.o. y.o. female who is seen in consultation at the request of Dr. Roselie Awkward for an evaluation of endometrial cancer.  Patient began having postmenopausal bleeding in September, similar to a normal menses.  This lasted for about a week and then stopped.  After that time, she had occasional spotting that persisted.  She describes this is sporadic.  She can go up to several days without any bleeding.  Oftentimes the bleeding she has as light.  She sometimes has heavier bleeding with passage of clots.  Her sister encouraged her after the first episode of heavy bleeding to see her PCP.  At  her next visit with her PCP, Pap smear was performed which was negative.  Patient was then referred to have an ultrasound. Pelvic ultrasound exam on 03/20/2021 shows uterus measures 10.3 x 5.9 x 6.6 cm.  Endometrium is thickened measuring 11.5 mm with increased vascularity.  Poorly defined endometrial myometrial interface noted.  Neither ovary appreciated.  Given ultrasound findings as well as continued bleeding, referral was placed to OB/GYN.  The patient underwent endometrial biopsy on 05/30/2021 which showed endometrioid adenocarcinoma, FIGO grade 1.  She denies any associated pelvic pain or cramping.  She notes appetite is okay and her intake has been somewhat decreased.  She denies any weight loss.  Denies any nausea or emesis.  Reports regular bowel function, denies urinary symptoms.  Her last hemoglobin A1c in 02/2021 was 6.2%.  Patient lives with her daughter.  She is retired.  PAST MEDICAL HISTORY:  Past Medical History:  Diagnosis Date   Acanthosis nigricans    Allergic rhinitis    Asthma    in past, no issues her 34s   Colonic polyp    Hyperplastic, no adenomatous or malignant changes.    Elevated LFTs    Resolved- etiology unclear hep B, Hep C serology neg 2005   Endometrial polyp    benign 12/06   Gastric ulcer 02/20/2013   Gastric ulcer with hemorrhage 02/20/2013   Hx of abnormal cervical Pap smear    1980 normal in 2006.    Hyperlipidemia    Hypertension    Iron deficiency anemia    postmenopausal bleeding followed by Dr. Kalman Shan.  Hx of benign endometrial polyp.    Lumbar back pain    Obesity    morbid obesity   Premenopausal menorrhagia  Right knee pain      PAST SURGICAL HISTORY:  Past Surgical History:  Procedure Laterality Date   COLONOSCOPY     COLONOSCOPY N/A 05/05/2013   Procedure: COLONOSCOPY;  Surgeon: Gatha Mayer, MD;  Location: [facility];  Service: Endoscopy;  Laterality: N/A;   ESOPHAGOGASTRODUODENOSCOPY N/A 02/20/2013   Procedure:  ESOPHAGOGASTRODUODENOSCOPY (EGD);  Surgeon: Milus Banister, MD;  Location: [facility];  Service: Endoscopy;  Laterality: N/A;   ESOPHAGOGASTRODUODENOSCOPY N/A 05/05/2013   Procedure: ESOPHAGOGASTRODUODENOSCOPY (EGD);  Surgeon: Gatha Mayer, MD;  Location: [facility];  Service: Endoscopy;  Laterality: N/A;   THERAPEUTIC ABORTION  1975    OB/GYN HISTORY:  OB History  Gravida Para Term Preterm AB Living  3 2 2   1     SAB IAB Ectopic Multiple Live Births    1     2    # Outcome Date GA Lbr Len/2nd Weight Sex Delivery Anes PTL Lv  3 IAB 1975          2 Term 11/1972 [redacted]w[redacted]d   F Vag-Spont None    1 Term 12/1971 [redacted]w[redacted]d   M Vag-Forceps Gen      No LMP recorded. Patient is postmenopausal.  Age at menarche: Does not remember Age at menopause: Does not remember, but a number of years ago Hx of HRT: Denies Hx of STDs: Denies Last pap: 03/06/2021-negative History of abnormal pap smears: Denies Remote history of an IUD in her 26s  SCREENING STUDIES:  Last mammogram: 11/2020  Last colonoscopy: 2019 Last bone mineral density: 2019  MEDICATIONS: Outpatient Encounter Medications as of 06/28/2021  Medication Sig   hydrochlorothiazide (HYDRODIURIL) 25 MG tablet TAKE 1 TABLET BY MOUTH DAILY.   NON FORMULARY Alive Vitamins-Take one daily   rosuvastatin (CRESTOR) 20 MG tablet TAKE ONE TABLET BY MOUTH DAILY   No facility-administered encounter medications on file as of 06/28/2021.    ALLERGIES:  No Known Allergies   FAMILY HISTORY:  Family History  Problem Relation Age of Onset   Hypertension Mother    Breast cancer Mother    Alcohol abuse Father    Hypertension Father    Heart disease Father    Gout Father    Breast cancer Sister    Hypertension Sister    Hypertension Sister    Colon cancer Neg Hx    Ovarian cancer Neg Hx    Endometrial cancer Neg Hx    Pancreatic cancer Neg Hx    Prostate cancer Neg Hx      SOCIAL HISTORY:  Social Connections: Not on file    REVIEW OF  SYSTEMS:  + vaginal bleeding Denies appetite changes, fevers, chills, fatigue, unexplained weight changes. Denies hearing loss, neck lumps or masses, mouth sores, ringing in ears or voice changes. Denies cough or wheezing.  Denies shortness of breath. Denies chest pain or palpitations. Denies leg swelling. Denies abdominal distention, pain, blood in stools, constipation, diarrhea, nausea, vomiting, or early satiety. Denies pain with intercourse, dysuria, frequency, hematuria or incontinence. Denies hot flashes, pelvic painor vaginal discharge.   Denies joint pain, back pain or muscle pain/cramps. Denies itching, rash, or wounds. Denies dizziness, headaches, numbness or seizures. Denies swollen lymph nodes or glands, denies easy bruising or bleeding. Denies anxiety, depression, confusion, or decreased concentration.  Physical Exam:  Vital Signs for this encounter:  Blood pressure (!) 167/74, pulse 87, temperature 98.3 F (36.8 C), resp. rate 17, weight 290 lb 2 oz (131.6 kg), SpO2 100 %. Body mass index is  47.55 kg/m. General: Alert, oriented, no acute distress.  HEENT: Normocephalic, atraumatic. Sclera anicteric.  Chest: Clear to auscultation bilaterally. No wheezes, rhonchi, or rales. Cardiovascular: Regular rate and rhythm, no murmurs, rubs, or gallops.  Abdomen: Obese. Normoactive bowel sounds. Soft, nondistended, nontender to palpation. No masses or hepatosplenomegaly appreciated. No palpable fluid wave.  Extremities: Grossly normal range of motion. Warm, well perfused. No edema bilaterally.  Skin: No rashes or lesions.  Lymphatics: No cervical, supraclavicular, or inguinal adenopathy.  GU:  Normal external female genitalia. No lesions. No discharge or bleeding.             Bladder/urethra:  No lesions or masses, well supported bladder             Vagina: No lesions or masses, no bleeding or discharge appreciated             Cervix: Normal appearing, no lesions.              Uterus: Exam somewhat limited by body habitus, uterus is mobile, no parametrial involvement or nodularity.             Adnexa: No masses appreciated.  Rectal: Deferred.  LABORATORY AND RADIOLOGIC DATA:  Outside medical records were reviewed to synthesize the above history, along with the history and physical obtained during the visit.   Lab Results  Component Value Date   WBC 6.6 02/20/2021   HGB 12.2 02/20/2021   HCT 38.3 02/20/2021   PLT 338 02/20/2021   GLUCOSE 95 02/20/2021   CHOL 163 06/25/2021   TRIG 99 06/25/2021   HDL 57 06/25/2021   LDLCALC 88 06/25/2021   ALT 13 02/20/2021   AST 23 02/20/2021   NA 141 02/20/2021   K 4.1 02/20/2021   CL 100 02/20/2021   CREATININE 0.90 02/20/2021   BUN 12 02/20/2021   CO2 24 02/20/2021   TSH 2.710 03/10/2019   INR 1.0 02/20/2021   HGBA1C 6.2 (A) 06/25/2021

## 2021-06-28 NOTE — Patient Instructions (Addendum)
Preparing for your Surgery  Plan for surgery on July 10, 2021 with Dr. Jeral Pinch at [facility] will be scheduled for robotic assisted total laparoscopic hysterectomy (removal of the uterus and cervix), bilateral salpingo-oophorectomy (removal of both ovaries and fallopian tubes), sentinel lymph node biopsy, possible lymph node dissection, possible laparotomy (larger incision on your abdomen if needed), possible dilation and curettage of the uterus with IUD placement in unable to tolerate positioning for robotic surgery.   Pre-operative Testing -You will receive a phone call from presurgical testing at [facility] to arrange for a pre-operative appointment and lab work.  -Bring your insurance card, copy of an advanced directive if applicable, medication list  -At that visit, you will be asked to sign a consent for a possible blood transfusion in case a transfusion becomes necessary during surgery.  The need for a blood transfusion is rare but having consent is a necessary part of your care.     -You should not be taking blood thinners or aspirin at least ten days prior to surgery unless instructed by your surgeon.  -Do not take supplements such as fish oil (omega 3), red yeast rice, turmeric before your surgery. You want to avoid medications with aspirin in them including headache powders such as BC or Goody's), Excedrin migraine.  Day Before Surgery at [place] will be asked to take in a light diet the day before surgery. You will be advised you can have clear liquids up until 3 hours before your surgery.    Eat a light diet the day before surgery.  Examples including soups, broths, toast, yogurt, mashed potatoes.  AVOID GAS PRODUCING FOODS. Things to avoid include carbonated beverages (fizzy beverages, sodas), raw fruits and raw vegetables (uncooked), or beans.   If your bowels are filled with gas, your surgeon will have difficulty visualizing your pelvic  organs which increases your surgical risks.  Your role in recovery Your role is to become active as soon as directed by your doctor, while still giving yourself time to heal.  Rest when you feel tired. You will be asked to do the following in order to speed your recovery:  - Cough and breathe deeply. This helps to clear and expand your lungs and can prevent pneumonia after surgery.  - [place]. Do mild physical activity. Walking or moving your legs help your circulation and body functions return to normal. Do not try to get up or walk alone the first time after surgery.   -If you develop swelling on one leg or the other, pain in the back of your leg, redness/warmth in one of your legs, please call the office or go to the Emergency Room to have a doppler to rule out a blood clot. For shortness of breath, chest pain-seek care in the Emergency Room as soon as possible. - Actively manage your pain. Managing your pain lets you move in comfort. We will ask you to rate your pain on a scale of zero to 10. It is your responsibility to tell your doctor or nurse where and how much you hurt so your pain can be treated.  Special Considerations -If you are diabetic, you may be placed on insulin after surgery to have closer control over your blood sugars to promote healing and recovery.  This does not mean that you will be discharged on insulin.  If applicable, your oral antidiabetics will be resumed when you are tolerating a solid diet.  -  Your final pathology results from surgery should be available around one week after surgery and the results will be relayed to you when available.  -Dr. Lahoma Crocker is the surgeon that assists your GYN Oncologist with surgery.  If you end up staying the night, the next day after your surgery you will either see Dr. Berline Lopes or Dr. Lahoma Crocker.  -FMLA forms can be faxed to [phone] and please allow 5-7 business days for completion.  Pain  Management After Surgery -You have been prescribed your pain medication and bowel regimen medications before surgery so that you can have these available when you are discharged from the hospital. The pain medication is for use ONLY AFTER surgery and a new prescription will not be given.   -Make sure that you have Tylenol at home to use on a regular basis after surgery for pain control.   -Review the attached handout on narcotic use and their risks and side effects.   Bowel Regimen -You have been prescribed Sennakot-S to take nightly to prevent constipation especially if you are taking the narcotic pain medication intermittently.  It is important to prevent constipation and drink adequate amounts of liquids. You can stop taking this medication when you are not taking pain medication and you are back on your normal bowel routine.  Risks of Surgery Risks of surgery are low but include bleeding, infection, damage to surrounding structures, re-operation, blood clots, and very rarely death.   Blood Transfusion Information (For the consent to be signed before surgery)  We will be checking your blood type before surgery so in case of emergencies, we will know what type of blood you would need.                                            WHAT IS A BLOOD TRANSFUSION?  A transfusion is the replacement of blood or some of its parts. Blood is made up of multiple cells which provide different functions. Red blood cells carry oxygen and are used for blood loss replacement. White blood cells fight against infection. Platelets control bleeding. Plasma helps clot blood. Other blood products are available for specialized needs, such as hemophilia or other clotting disorders. BEFORE THE TRANSFUSION  Who gives blood for transfusions?  You may be able to donate blood to be used at a later date on yourself (autologous donation). Relatives can be asked to donate blood. This is generally not any safer than if  you have received blood from a stranger. The same precautions are taken to ensure safety when a relative's blood is donated. Healthy volunteers who are fully evaluated to make sure their blood is safe. This is blood bank blood. Transfusion therapy is the safest it has ever been in the practice of medicine. Before blood is taken from a donor, a complete history is taken to make sure that person has no history of diseases nor engages in risky social behavior (examples are intravenous drug use or sexual activity with multiple partners). The donor's travel history is screened to minimize risk of transmitting infections, such as malaria. The donated blood is tested for signs of infectious diseases, such as HIV and hepatitis. The blood is then tested to be sure it is compatible with you in order to minimize the chance of a transfusion reaction. If you or a relative donates blood, this is often done in anticipation  of surgery and is not appropriate for emergency situations. It takes many days to process the donated blood. RISKS AND COMPLICATIONS Although transfusion therapy is very safe and saves many lives, the main dangers of transfusion include:  Getting an infectious disease. Developing a transfusion reaction. This is an allergic reaction to something in the blood you were given. Every precaution is taken to prevent this. The decision to have a blood transfusion has been considered carefully by your caregiver before blood is given. Blood is not given unless the benefits outweigh the risks.  AFTER SURGERY INSTRUCTIONS  Return to work: 4-6 weeks if applicable  Activity: 1. Be up and out of the bed during the day.  Take a nap if needed.  You may walk up steps but be careful and use the hand rail.  Stair climbing will tire you more than you think, you may need to stop part way and rest.   2. No lifting or straining for 6 weeks over 10 pounds. No pushing, pulling, straining for 6 weeks.  3. No driving for  around 1 week(s).  Do not drive if you are taking narcotic pain medicine and make sure that your reaction time has returned.   4. You can shower as soon as the next day after surgery. Shower daily.  Use your regular soap and water (not directly on the incision) and pat your incision(s) dry afterwards; don't rub.  No tub baths or submerging your body in water until cleared by your surgeon. If you have the soap that was given to you by pre-surgical testing that was used before surgery, you do not need to use it afterwards because this can irritate your incisions.   5. No sexual activity and nothing in the vagina for 8 weeks.  6. You may experience a small amount of clear drainage from your incisions, which is normal.  If the drainage persists, increases, or changes color please call the office.  7. Do not use creams, lotions, or ointments such as neosporin on your incisions after surgery until advised by your surgeon because they can cause removal of the dermabond glue on your incisions.    8. You may experience vaginal spotting after surgery or around the 6-8 week mark from surgery when the stitches at the top of the vagina begin to dissolve.  The spotting is normal but if you experience heavy bleeding, call our office.  9. Take Tylenol first for pain and only use narcotic pain medication for severe pain not relieved by the Tylenol.  Monitor your Tylenol intake to a max of 4,000 mg in a 24 hour period.   Diet: 1. Low sodium Heart Healthy Diet is recommended but you are cleared to resume your normal (before surgery) diet after your procedure.  2. It is safe to use a laxative, such as Miralax or Colace, if you have difficulty moving your bowels. You have been prescribed Sennakot-S to take at bedtime every evening after surgery to keep bowel movements regular and to prevent constipation.    Wound Care: 1. Keep clean and dry.  Shower daily.  Reasons to call the Doctor: Fever - Oral temperature  greater than 100.4 degrees Fahrenheit Foul-smelling vaginal discharge Difficulty urinating Nausea and vomiting Increased pain at the site of the incision that is unrelieved with pain medicine. Difficulty breathing with or without chest pain New calf pain especially if only on one side Sudden, continuing increased vaginal bleeding with or without clots.   Contacts: For questions or  concerns you should contact:  Dr. Jeral Pinch at [phone]  Joylene John, NP at [phone]  After Hours: call [phone] and have the GYN Oncologist paged/contacted (after 5 pm or on the weekends).  Messages sent via mychart are for non-urgent matters and are not responded to after hours so for urgent needs, please call the after hours number.

## 2021-06-28 NOTE — Progress Notes (Signed)
GYNECOLOGIC ONCOLOGY NEW PATIENT CONSULTATION   Patient Name: [name]  Patient Age: 71 y.o. Date of Service: 06/28/2021 Referring Provider: [name], MD  Primary Care Provider: Madalyn Rob, MD Consulting Provider: Jeral Pinch, MD   Assessment/Plan:  Postmenopausal patient with clinical stage I grade 1 endometrioid endometrial adenocarcinoma.  We reviewed the nature of endometrial cancer and its recommended surgical staging, including total hysterectomy, bilateral salpingo-oophorectomy, and lymph node assessment. The patient is a suitable candidate for staging via a minimally invasive approach to surgery.  We reviewed that robotic assistance would be used to complete the surgery.   While I do not anticipate issues with Trendelenburg, we discussed that if she did not tolerate Trendelenburg positioning or insufflation, that my recommendation is to proceed with D&C and Mirena IUD placement for treatment of her grade 1 endometrial cancer while we aggressively work on weight loss to make definitive surgery safe.  We discussed that most endometrial cancer is detected early and that decisions regarding adjuvant therapy will be made based on her final pathology.   We reviewed the sentinel lymph node technique. Risks and benefits of sentinel lymph node biopsy was reviewed. We reviewed the technique and ICG dye. The patient DOES NOT have an iodine allergy or known liver dysfunction. We reviewed the false negative rate (0.4%), and that 3% of patients with metastatic disease will not have it detected by SLN biopsy in endometrial cancer. A low risk of allergic reaction to the dye, <0.2% for ICG, has been reported. We also discussed that in the case of failed mapping, which occurs 40% of the time, a bilateral or unilateral lymphadenectomy will be performed at the surgeons discretion.   Potential benefits of sentinel nodes including a higher detection rate for metastasis due to ultrastaging  and potential reduction in operative morbidity. However, there remains uncertainty as to the role for treatment of micrometastatic disease. Further, the benefit of operative morbidity associated with the SLN technique in endometrial cancer is not yet completely known. In other patient populations (e.g. the cervical cancer population) there has been observed reductions in morbidity with SLN biopsy compared to pelvic lymphadenectomy. Lymphedema, nerve dysfunction and lymphocysts are all potential risks with the SLN technique as with complete lymphadenectomy. Additional risks to the patient include the risk of damage to an internal organ while operating in an altered view (e.g. the black and white image of the robotic fluorescence imaging mode).   The patient was consented for a robotic assisted hysterectomy, bilateral salpingo-oophorectomy, sentinel lymph node evaluation, possible lymph node dissection, possible laparotomy, and possible D&C with Mirena IUD insertion. The risks of surgery were discussed in detail and she understands these to include infection; wound separation; hernia; vaginal cuff separation, injury to adjacent organs such as bowel, bladder, blood vessels, ureters and nerves; bleeding which may require blood transfusion; anesthesia risk; thromboembolic events; possible death; unforeseen complications; possible need for re-exploration; medical complications such as heart attack, stroke, pleural effusion and pneumonia; and, if full lymphadenectomy is performed the risk of lymphedema and lymphocyst. The patient will receive DVT and antibiotic prophylaxis as indicated. She voiced a clear understanding. She had the opportunity to ask questions. Perioperative instructions were reviewed with her. Prescriptions for post-op medications were sent to her pharmacy of choice.  We reviewed the role of progesterone therapy and the effect on preneoplastic lesions, believed to include induction of apoptosis in  addition to tissue sloughing during withdrawal bleeding.  Activation of the progesterone receptors is believed to lead stromal  decidualization and thinning of the lining.  We reviewed the 3 most studied options, to include levonorgesterol IUD (18mcg/d), oral medorxyprogesterone acetate 10mg  daily or cyclically 93-26 days per month, or oral megesterol acetate 40-200mg  per day.  She understands that all options have few side effects, most common being infrequent edema, GI disturbances, and thromboembolic events), but that local progesterone through IUD may have a stronger effect on the endometrium with less systemic side effects.    We also reviewed the importance of weight loss in overall health as well as reduction in cancer risk.    A copy of this note was sent to the patient's referring provider.   70 minutes of total time was spent for this patient encounter, including preparation, face-to-face counseling with the patient and coordination of care, and documentation of the encounter.   Jeral Pinch, MD  Division of Gynecologic Oncology  Department of Obstetrics and Gynecology  [facility]  ___________________________________________  Chief Complaint: Chief Complaint  Patient presents with   Endometrial cancer, grade I ([place])    History of Present Illness:  [name] is a 71 y.o. y.o. female who is seen in consultation at the request of Dr. Roselie Awkward for an evaluation of endometrial cancer.  Patient began having postmenopausal bleeding in September, similar to a normal menses.  This lasted for about a week and then stopped.  After that time, she had occasional spotting that persisted.  She describes this is sporadic.  She can go up to several days without any bleeding.  Oftentimes the bleeding she has as light.  She sometimes has heavier bleeding with passage of clots.  Her sister encouraged her after the first episode of heavy bleeding to see her PCP.  At  her next visit with her PCP, Pap smear was performed which was negative.  Patient was then referred to have an ultrasound. Pelvic ultrasound exam on 03/20/2021 shows uterus measures 10.3 x 5.9 x 6.6 cm.  Endometrium is thickened measuring 11.5 mm with increased vascularity.  Poorly defined endometrial myometrial interface noted.  Neither ovary appreciated.  Given ultrasound findings as well as continued bleeding, referral was placed to OB/GYN.  The patient underwent endometrial biopsy on 05/30/2021 which showed endometrioid adenocarcinoma, FIGO grade 1.  She denies any associated pelvic pain or cramping.  She notes appetite is okay and her intake has been somewhat decreased.  She denies any weight loss.  Denies any nausea or emesis.  Reports regular bowel function, denies urinary symptoms.  Her last hemoglobin A1c in 02/2021 was 6.2%.  Patient lives with her daughter.  She is retired.  PAST MEDICAL HISTORY:  Past Medical History:  Diagnosis Date   Acanthosis nigricans    Allergic rhinitis    Asthma    in past, no issues her 66s   Colonic polyp    Hyperplastic, no adenomatous or malignant changes.    Elevated LFTs    Resolved- etiology unclear hep B, Hep C serology neg 2005   Endometrial polyp    benign 12/06   Gastric ulcer 02/20/2013   Gastric ulcer with hemorrhage 02/20/2013   Hx of abnormal cervical Pap smear    1980 normal in 2006.    Hyperlipidemia    Hypertension    Iron deficiency anemia    postmenopausal bleeding followed by Dr. Kalman Shan.  Hx of benign endometrial polyp.    Lumbar back pain    Obesity    morbid obesity   Premenopausal menorrhagia  Right knee pain      PAST SURGICAL HISTORY:  Past Surgical History:  Procedure Laterality Date   COLONOSCOPY     COLONOSCOPY N/A 05/05/2013   Procedure: COLONOSCOPY;  Surgeon: Gatha Mayer, MD;  Location: [facility];  Service: Endoscopy;  Laterality: N/A;   ESOPHAGOGASTRODUODENOSCOPY N/A 02/20/2013   Procedure:  ESOPHAGOGASTRODUODENOSCOPY (EGD);  Surgeon: Milus Banister, MD;  Location: [facility];  Service: Endoscopy;  Laterality: N/A;   ESOPHAGOGASTRODUODENOSCOPY N/A 05/05/2013   Procedure: ESOPHAGOGASTRODUODENOSCOPY (EGD);  Surgeon: Gatha Mayer, MD;  Location: [facility];  Service: Endoscopy;  Laterality: N/A;   THERAPEUTIC ABORTION  1975    OB/GYN HISTORY:  OB History  Gravida Para Term Preterm AB Living  3 2 2   1     SAB IAB Ectopic Multiple Live Births    1     2    # Outcome Date GA Lbr Len/2nd Weight Sex Delivery Anes PTL Lv  3 IAB 1975          2 Term 11/1972 [redacted]w[redacted]d   F Vag-Spont None    1 Term 12/1971 [redacted]w[redacted]d   M Vag-Forceps Gen      No LMP recorded. Patient is postmenopausal.  Age at menarche: Does not remember Age at menopause: Does not remember, but a number of years ago Hx of HRT: Denies Hx of STDs: Denies Last pap: 03/06/2021-negative History of abnormal pap smears: Denies Remote history of an IUD in her 60s  SCREENING STUDIES:  Last mammogram: 11/2020  Last colonoscopy: 2019 Last bone mineral density: 2019  MEDICATIONS: Outpatient Encounter Medications as of 06/28/2021  Medication Sig   hydrochlorothiazide (HYDRODIURIL) 25 MG tablet TAKE 1 TABLET BY MOUTH DAILY.   NON FORMULARY Alive Vitamins-Take one daily   rosuvastatin (CRESTOR) 20 MG tablet TAKE ONE TABLET BY MOUTH DAILY   No facility-administered encounter medications on file as of 06/28/2021.    ALLERGIES:  No Known Allergies   FAMILY HISTORY:  Family History  Problem Relation Age of Onset   Hypertension Mother    Breast cancer Mother    Alcohol abuse Father    Hypertension Father    Heart disease Father    Gout Father    Breast cancer Sister    Hypertension Sister    Hypertension Sister    Colon cancer Neg Hx    Ovarian cancer Neg Hx    Endometrial cancer Neg Hx    Pancreatic cancer Neg Hx    Prostate cancer Neg Hx      SOCIAL HISTORY:  Social Connections: Not on file    REVIEW OF  SYSTEMS:  + vaginal bleeding Denies appetite changes, fevers, chills, fatigue, unexplained weight changes. Denies hearing loss, neck lumps or masses, mouth sores, ringing in ears or voice changes. Denies cough or wheezing.  Denies shortness of breath. Denies chest pain or palpitations. Denies leg swelling. Denies abdominal distention, pain, blood in stools, constipation, diarrhea, nausea, vomiting, or early satiety. Denies pain with intercourse, dysuria, frequency, hematuria or incontinence. Denies hot flashes, pelvic painor vaginal discharge.   Denies joint pain, back pain or muscle pain/cramps. Denies itching, rash, or wounds. Denies dizziness, headaches, numbness or seizures. Denies swollen lymph nodes or glands, denies easy bruising or bleeding. Denies anxiety, depression, confusion, or decreased concentration.  Physical Exam:  Vital Signs for this encounter:  Blood pressure (!) 167/74, pulse 87, temperature 98.3 F (36.8 C), resp. rate 17, weight 290 lb 2 oz (131.6 kg), SpO2 100 %. Body mass index is  47.55 kg/m. General: Alert, oriented, no acute distress.  HEENT: Normocephalic, atraumatic. Sclera anicteric.  Chest: Clear to auscultation bilaterally. No wheezes, rhonchi, or rales. Cardiovascular: Regular rate and rhythm, no murmurs, rubs, or gallops.  Abdomen: Obese. Normoactive bowel sounds. Soft, nondistended, nontender to palpation. No masses or hepatosplenomegaly appreciated. No palpable fluid wave.  Extremities: Grossly normal range of motion. Warm, well perfused. No edema bilaterally.  Skin: No rashes or lesions.  Lymphatics: No cervical, supraclavicular, or inguinal adenopathy.  GU:  Normal external female genitalia. No lesions. No discharge or bleeding.             Bladder/urethra:  No lesions or masses, well supported bladder             Vagina: No lesions or masses, no bleeding or discharge appreciated             Cervix: Normal appearing, no lesions.              Uterus: Exam somewhat limited by body habitus, uterus is mobile, no parametrial involvement or nodularity.             Adnexa: No masses appreciated.  Rectal: Deferred.  LABORATORY AND RADIOLOGIC DATA:  Outside medical records were reviewed to synthesize the above history, along with the history and physical obtained during the visit.   Lab Results  Component Value Date   WBC 6.6 02/20/2021   HGB 12.2 02/20/2021   HCT 38.3 02/20/2021   PLT 338 02/20/2021   GLUCOSE 95 02/20/2021   CHOL 163 06/25/2021   TRIG 99 06/25/2021   HDL 57 06/25/2021   LDLCALC 88 06/25/2021   ALT 13 02/20/2021   AST 23 02/20/2021   NA 141 02/20/2021   K 4.1 02/20/2021   CL 100 02/20/2021   CREATININE 0.90 02/20/2021   BUN 12 02/20/2021   CO2 24 02/20/2021   TSH 2.710 03/10/2019   INR 1.0 02/20/2021   HGBA1C 6.2 (A) 06/25/2021

## 2021-06-29 NOTE — Progress Notes (Signed)
Patient here with her daughter for new patient consultation with Dr. Berline Lopes and for a pre-operative discussion prior to her scheduled surgery on July 10, 2021. She is scheduled for robotic assisted total laparoscopic hysterectomy, bilateral salpingo-oophorectomy, sentinel lymph node biopsy, possible lymph node dissection, possible laparotomy, possible dilation and curettage of the uterus with IUD placement. The surgery was discussed in detail.  See after visit summary for additional details. Visual aids used to discuss items related to surgery including sequential compression stockings, foley catheter, IV pump, multi-modal pain regimen including tylenol, photo of the surgical robot, female reproductive system to discuss surgery in detail.      Discussed post-op pain management in detail including the aspects of the enhanced recovery pathway.  Advised her that a new prescription would be sent in for tramadol and it is only to be used for after her upcoming surgery.  We discussed the use of tylenol post-op and to monitor for a maximum of 4,000 mg in a 24 hour period.  Also prescribed sennakot to be used after surgery and to hold if having loose stools.  Discussed bowel regimen in detail.     Discussed the use of heparin pre-op, SCDs, and measures to take at home to prevent DVT including frequent mobility.  Reportable signs and symptoms of DVT discussed. Post-operative instructions discussed and expectations for after surgery. Incisional care discussed as well including reportable signs and symptoms including erythema, drainage, wound separation.     10 minutes spent with the patient.  Verbalizing understanding of material discussed. No needs or concerns voiced at the end of the visit.   Advised patient and family to call for any needs.  Advised that her post-operative medications had been prescribed and could be picked up at any time.    This appointment is included in the global surgical bundle as  pre-operative teaching and has no charge.

## 2021-06-29 NOTE — Patient Instructions (Signed)
Preparing for your Surgery   Plan for surgery on July 10, 2021 with Dr. Jeral Pinch at [place] will be scheduled for robotic assisted total laparoscopic hysterectomy (removal of the uterus and cervix), bilateral salpingo-oophorectomy (removal of both ovaries and fallopian tubes), sentinel lymph node biopsy, possible lymph node dissection, possible laparotomy (larger incision on your abdomen if needed), possible dilation and curettage of the uterus with IUD placement in unable to tolerate positioning for robotic surgery.    Pre-operative Testing -You will receive a phone call from presurgical testing at [facility] to arrange for a pre-operative appointment and lab work.   -Bring your insurance card, copy of an advanced directive if applicable, medication list   -At that visit, you will be asked to sign a consent for a possible blood transfusion in case a transfusion becomes necessary during surgery.  The need for a blood transfusion is rare but having consent is a necessary part of your care.      -You should not be taking blood thinners or aspirin at least ten days prior to surgery unless instructed by your surgeon.   -Do not take supplements such as fish oil (omega 3), red yeast rice, turmeric before your surgery. You want to avoid medications with aspirin in them including headache powders such as BC or Goody's), Excedrin migraine.   Day Before Surgery at [place] will be asked to take in a light diet the day before surgery. You will be advised you can have clear liquids up until 3 hours before your surgery.     Eat a light diet the day before surgery.  Examples including soups, broths, toast, yogurt, mashed potatoes.  AVOID GAS PRODUCING FOODS. Things to avoid include carbonated beverages (fizzy beverages, sodas), raw fruits and raw vegetables (uncooked), or beans.    If your bowels are filled with gas, your surgeon will have difficulty visualizing your  pelvic organs which increases your surgical risks.   Your role in recovery Your role is to become active as soon as directed by your doctor, while still giving yourself time to heal.  Rest when you feel tired. You will be asked to do the following in order to speed your recovery:   - Cough and breathe deeply. This helps to clear and expand your lungs and can prevent pneumonia after surgery.  - [place]. Do mild physical activity. Walking or moving your legs help your circulation and body functions return to normal. Do not try to get up or walk alone the first time after surgery.   -If you develop swelling on one leg or the other, pain in the back of your leg, redness/warmth in one of your legs, please call the office or go to the Emergency Room to have a doppler to rule out a blood clot. For shortness of breath, chest pain-seek care in the Emergency Room as soon as possible. - Actively manage your pain. Managing your pain lets you move in comfort. We will ask you to rate your pain on a scale of zero to 10. It is your responsibility to tell your doctor or nurse where and how much you hurt so your pain can be treated.   Special Considerations -If you are diabetic, you may be placed on insulin after surgery to have closer control over your blood sugars to promote healing and recovery.  This does not mean that you will be discharged on insulin.  If applicable, your oral  antidiabetics will be resumed when you are tolerating a solid diet.   -Your final pathology results from surgery should be available around one week after surgery and the results will be relayed to you when available.   -Dr. Lahoma Crocker is the surgeon that assists your GYN Oncologist with surgery.  If you end up staying the night, the next day after your surgery you will either see Dr. Berline Lopes or Dr. Lahoma Crocker.   -FMLA forms can be faxed to [phone] and please allow 5-7 business days for  completion.   Pain Management After Surgery -You have been prescribed your pain medication and bowel regimen medications before surgery so that you can have these available when you are discharged from the hospital. The pain medication is for use ONLY AFTER surgery and a new prescription will not be given.    -Make sure that you have Tylenol at home to use on a regular basis after surgery for pain control.    -Review the attached handout on narcotic use and their risks and side effects.    Bowel Regimen -You have been prescribed Sennakot-S to take nightly to prevent constipation especially if you are taking the narcotic pain medication intermittently.  It is important to prevent constipation and drink adequate amounts of liquids. You can stop taking this medication when you are not taking pain medication and you are back on your normal bowel routine.   Risks of Surgery Risks of surgery are low but include bleeding, infection, damage to surrounding structures, re-operation, blood clots, and very rarely death.     Blood Transfusion Information (For the consent to be signed before surgery)   We will be checking your blood type before surgery so in case of emergencies, we will know what type of blood you would need.                                             WHAT IS A BLOOD TRANSFUSION?   A transfusion is the replacement of blood or some of its parts. Blood is made up of multiple cells which provide different functions. Red blood cells carry oxygen and are used for blood loss replacement. White blood cells fight against infection. Platelets control bleeding. Plasma helps clot blood. Other blood products are available for specialized needs, such as hemophilia or other clotting disorders. BEFORE THE TRANSFUSION  Who gives blood for transfusions?  You may be able to donate blood to be used at a later date on yourself (autologous donation). Relatives can be asked to donate blood. This is  generally not any safer than if you have received blood from a stranger. The same precautions are taken to ensure safety when a relative's blood is donated. Healthy volunteers who are fully evaluated to make sure their blood is safe. This is blood bank blood. Transfusion therapy is the safest it has ever been in the practice of medicine. Before blood is taken from a donor, a complete history is taken to make sure that person has no history of diseases nor engages in risky social behavior (examples are intravenous drug use or sexual activity with multiple partners). The donor's travel history is screened to minimize risk of transmitting infections, such as malaria. The donated blood is tested for signs of infectious diseases, such as HIV and hepatitis. The blood is then tested to be sure it is compatible  with you in order to minimize the chance of a transfusion reaction. If you or a relative donates blood, this is often done in anticipation of surgery and is not appropriate for emergency situations. It takes many days to process the donated blood. RISKS AND COMPLICATIONS Although transfusion therapy is very safe and saves many lives, the main dangers of transfusion include:  Getting an infectious disease. Developing a transfusion reaction. This is an allergic reaction to something in the blood you were given. Every precaution is taken to prevent this. The decision to have a blood transfusion has been considered carefully by your caregiver before blood is given. Blood is not given unless the benefits outweigh the risks.   AFTER SURGERY INSTRUCTIONS   Return to work: 4-6 weeks if applicable   Activity: 1. Be up and out of the bed during the day.  Take a nap if needed.  You may walk up steps but be careful and use the hand rail.  Stair climbing will tire you more than you think, you may need to stop part way and rest.    2. No lifting or straining for 6 weeks over 10 pounds. No pushing, pulling,  straining for 6 weeks.   3. No driving for around 1 week(s).  Do not drive if you are taking narcotic pain medicine and make sure that your reaction time has returned.    4. You can shower as soon as the next day after surgery. Shower daily.  Use your regular soap and water (not directly on the incision) and pat your incision(s) dry afterwards; don't rub.  No tub baths or submerging your body in water until cleared by your surgeon. If you have the soap that was given to you by pre-surgical testing that was used before surgery, you do not need to use it afterwards because this can irritate your incisions.    5. No sexual activity and nothing in the vagina for 8 weeks.   6. You may experience a small amount of clear drainage from your incisions, which is normal.  If the drainage persists, increases, or changes color please call the office.   7. Do not use creams, lotions, or ointments such as neosporin on your incisions after surgery until advised by your surgeon because they can cause removal of the dermabond glue on your incisions.     8. You may experience vaginal spotting after surgery or around the 6-8 week mark from surgery when the stitches at the top of the vagina begin to dissolve.  The spotting is normal but if you experience heavy bleeding, call our office.   9. Take Tylenol first for pain and only use narcotic pain medication for severe pain not relieved by the Tylenol.  Monitor your Tylenol intake to a max of 4,000 mg in a 24 hour period.    Diet: 1. Low sodium Heart Healthy Diet is recommended but you are cleared to resume your normal (before surgery) diet after your procedure.   2. It is safe to use a laxative, such as Miralax or Colace, if you have difficulty moving your bowels. You have been prescribed Sennakot-S to take at bedtime every evening after surgery to keep bowel movements regular and to prevent constipation.     Wound Care: 1. Keep clean and dry.  Shower daily.    Reasons to call the Doctor: Fever - Oral temperature greater than 100.4 degrees Fahrenheit Foul-smelling vaginal discharge Difficulty urinating Nausea and vomiting Increased pain at the site  of the incision that is unrelieved with pain medicine. Difficulty breathing with or without chest pain New calf pain especially if only on one side Sudden, continuing increased vaginal bleeding with or without clots.   Contacts: For questions or concerns you should contact:   Dr. Jeral Pinch at [phone]   Joylene John, NP at [phone]   After Hours: call [phone] and have the GYN Oncologist paged/contacted (after 5 pm or on the weekends).   Messages sent via mychart are for non-urgent matters and are not responded to after hours so for urgent needs, please call the after hours number.

## 2021-07-02 ENCOUNTER — Encounter: Payer: Self-pay | Admitting: Internal Medicine

## 2021-07-02 ENCOUNTER — Ambulatory Visit: Payer: Medicare Other | Admitting: Obstetrics & Gynecology

## 2021-07-02 NOTE — Assessment & Plan Note (Signed)
Patient was started on crestor last OV. States she is compliant. No complaints.  Lipid panel checked, LDL 88. Continue current management.

## 2021-07-02 NOTE — Assessment & Plan Note (Signed)
Patient reports compliance with medications. She checks BP at home with SBP typically around 120. States she thinks her BP is elevated due to anxiety relating to OBGYN appointment after OV.  SBP 150's during OV, above goal.  Patient on HCTZ, will continue for now. Advised patient for bring BP cuff to next visit.

## 2021-07-02 NOTE — Patient Instructions (Addendum)
DUE TO COVID-19 ONLY ONE VISITOR IS ALLOWED TO COME WITH YOU AND STAY IN THE WAITING ROOM ONLY DURING PRE OP AND PROCEDURE.   **NO VISITORS ARE ALLOWED IN THE SHORT STAY AREA OR RECOVERY ROOM!!**       Your procedure is scheduled on: 07/10/21   Report to [facility] Main Entrance    Report to admitting at 10:15 AM   Call this number if you have problems the morning of surgery [phone]   Eat a light diet the day before surgery. Do not eat food :After Midnight.   May have liquids until 9:30 AM day of surgery  CLEAR LIQUID DIET  Foods Allowed                                                                     Foods Excluded  Water, Black Coffee and tea (no milk or creamer)           liquids that you cannot  Plain Jell-O in any flavor  (No red)                                    see through such as: Fruit ices (not with fruit pulp)                                            milk, soups, orange juice              Iced Popsicles (No red)                                                All solid food                                   Apple juices Sports drinks like Gatorade (No red) Lightly seasoned clear broth or consume(fat free) Sugar    The day of surgery:  Drink ONE (1) Pre-Surgery G2 at 9:30 AM the morning of surgery. Drink in one sitting. Do not sip.  This drink was given to you during your hospital  pre-op appointment visit. Nothing else to drink after completing the  Pre-Surgery G2.          If you have questions, please contact your surgeons office.  FOLLOW BOWEL PREP INSTRUCTIONS YOU RECEIVED FROM YOUR SURGEON'S OFFICE!!!     Oral Hygiene is also important to reduce your risk of infection.                                    Remember - BRUSH YOUR TEETH THE MORNING OF SURGERY WITH YOUR REGULAR TOOTHPASTE   Stop all vitamins and supplements 7 days before surgery.   Take these medicines the morning of surgery with A SIP OF WATER: Rosuvastatin   DO  NOT TAKE  ANY ORAL DIABETIC MEDICATIONS DAY OF YOUR SURGERY                              You may not have any metal on your body including hair pins, jewelry, and body piercing             Do not wear make-up, lotions, powders, perfumes, or deodorant  Do not wear nail polish including gel and S&S, artificial/acrylic nails, or any other type of covering on natural nails including finger and toenails. If you have artificial nails, gel coating, etc. that needs to be removed by a nail salon please have this removed prior to surgery or surgery may need to be canceled/ delayed if the surgeon/ anesthesia feels like they are unable to be safely monitored.   Do not shave  48 hours prior to surgery.    Do not bring valuables to the hospital. [place].   Patients discharged on the day of surgery will not be allowed to drive home.  Someone needs to stay with you for the first 24 hours after anesthesia.   Special Instructions: Bring a copy of your healthcare power of attorney and living will documents         the day of surgery if you haven't scanned them before.              Please read over the following fact sheets you were given: IF YOU HAVE QUESTIONS ABOUT YOUR PRE-OP INSTRUCTIONS PLEASE CALL [facility] - Preparing for Surgery Before surgery, you can play an important role.  Because skin is not sterile, your skin needs to be as free of germs as possible.  You can reduce the number of germs on your skin by washing with CHG (chlorahexidine gluconate) soap before surgery.  CHG is an antiseptic cleaner which kills germs and bonds with the skin to continue killing germs even after washing. Please DO NOT use if you have an allergy to CHG or antibacterial soaps.  If your skin becomes reddened/irritated stop using the CHG and inform your nurse when you arrive at Short Stay. Do not shave (including legs and underarms) for at least 48 hours prior  to the first CHG shower.  You may shave your face/neck.  Please follow these instructions carefully:  1.  Shower with CHG Soap the night before surgery and the  morning of surgery.  2.  If you choose to wash your hair, wash your hair first as usual with your normal  shampoo.  3.  After you shampoo, rinse your hair and body thoroughly to remove the shampoo.                             4.  Use CHG as you would any other liquid soap.  You can apply chg directly to the skin and wash.  Gently with a scrungie or clean washcloth.  5.  Apply the CHG Soap to your body ONLY FROM THE NECK DOWN.   Do   not use on face/ open                           Wound or open sores. Avoid contact with  eyes, ears mouth and   genitals (private parts).                       Wash face,  Genitals (private parts) with your normal soap.             6.  Wash thoroughly, paying special attention to the area where your    surgery  will be performed.  7.  Thoroughly rinse your body with warm water from the neck down.  8.  DO NOT shower/wash with your normal soap after using and rinsing off the CHG Soap.                9.  Pat yourself dry with a clean towel.            10.  Wear clean pajamas.            11.  Place clean sheets on your bed the night of your first shower and do not  sleep with pets. Day of Surgery : Do not apply any lotions/deodorants the morning of surgery.  Please wear clean clothes to the hospital/surgery center.  FAILURE TO FOLLOW THESE INSTRUCTIONS MAY RESULT IN THE CANCELLATION OF YOUR SURGERY  PATIENT SIGNATURE_________________________________  NURSE SIGNATURE__________________________________  ________________________________________________________________________  WHAT IS A BLOOD TRANSFUSION? Blood Transfusion Information  A transfusion is the replacement of blood or some of its parts. Blood is made up of multiple cells which provide different functions. Red blood cells carry oxygen and are used  for blood loss replacement. White blood cells fight against infection. Platelets control bleeding. Plasma helps clot blood. Other blood products are available for specialized needs, such as hemophilia or other clotting disorders. BEFORE THE TRANSFUSION  Who gives blood for transfusions?  Healthy volunteers who are fully evaluated to make sure their blood is safe. This is blood bank blood. Transfusion therapy is the safest it has ever been in the practice of medicine. Before blood is taken from a donor, a complete history is taken to make sure that person has no history of diseases nor engages in risky social behavior (examples are intravenous drug use or sexual activity with multiple partners). The donor's travel history is screened to minimize risk of transmitting infections, such as malaria. The donated blood is tested for signs of infectious diseases, such as HIV and hepatitis. The blood is then tested to be sure it is compatible with you in order to minimize the chance of a transfusion reaction. If you or a relative donates blood, this is often done in anticipation of surgery and is not appropriate for emergency situations. It takes many days to process the donated blood. RISKS AND COMPLICATIONS Although transfusion therapy is very safe and saves many lives, the main dangers of transfusion include:  Getting an infectious disease. Developing a transfusion reaction. This is an allergic reaction to something in the blood you were given. Every precaution is taken to prevent this. The decision to have a blood transfusion has been considered carefully by your caregiver before blood is given. Blood is not given unless the benefits outweigh the risks. AFTER THE TRANSFUSION Right after receiving a blood transfusion, you will usually feel much better and more energetic. This is especially true if your red blood cells have gotten low (anemic). The transfusion raises the level of the red blood cells which  carry oxygen, and this usually causes an energy increase. The nurse administering the transfusion will monitor you carefully for complications. HOME  CARE INSTRUCTIONS  No special instructions are needed after a transfusion. You may find your energy is better. Speak with your caregiver about any limitations on activity for underlying diseases you may have. SEEK MEDICAL CARE IF:  Your condition is not improving after your transfusion. You develop redness or irritation at the intravenous (IV) site. SEEK IMMEDIATE MEDICAL CARE IF:  Any of the following symptoms occur over the next 12 hours: Shaking chills. You have a temperature by mouth above 102 F (38.9 C), not controlled by medicine. Chest, back, or muscle pain. People around you feel you are not acting correctly or are confused. Shortness of breath or difficulty breathing. Dizziness and fainting. You get a rash or develop hives. You have a decrease in urine output. Your urine turns a dark color or changes to pink, red, or brown. Any of the following symptoms occur over the next 10 days: You have a temperature by mouth above 102 F (38.9 C), not controlled by medicine. Shortness of breath. Weakness after normal activity. The white part of the eye turns yellow (jaundice). You have a decrease in the amount of urine or are urinating less often. Your urine turns a dark color or changes to pink, red, or brown. Document Released: 05/17/2000 Document Revised: 08/12/2011 Document Reviewed: 01/04/2008 [facility] Patient Information 2014 [place], [place].  _______________________________________________________________________

## 2021-07-02 NOTE — Progress Notes (Addendum)
COVID swab appointment: n/a  COVID Vaccine Completed: yes x2 Date COVID Vaccine completed: 12/29/19, 01/26/20 Has received booster: COVID vaccine manufacturer: [employer]   Date of COVID positive in last 90 days: no  PCP - Madalyn Rob, MD Cardiologist - n/a  Chest x-ray - n/a EKG - 07/03/21 Epic/chart Stress Test - n/a ECHO - n/a Cardiac Cath - n/a Pacemaker/ICD device last checked: n/a Spinal Cord Stimulator: n/a  Sleep Study - n/a CPAP -   Fasting Blood Sugar - pre DM, no medications or checks at home Checks Blood Sugar _____ times a day  Blood Thinner Instructions: n/a Aspirin Instructions: Last Dose:  Activity level: Can go up a flight of stairs and perform activities of daily living without stopping and without symptoms of chest pain or shortness of breath.    Anesthesia review:   Patient denies shortness of breath, fever, cough and chest pain at PAT appointment   Patient verbalized understanding of instructions that were given to them at the PAT appointment. Patient was also instructed that they will need to review over the PAT instructions again at home before surgery.

## 2021-07-02 NOTE — Assessment & Plan Note (Signed)
Patient has been working on weight loss. A1c 6.2 during OV. Continue to encourage control through diet and exercise. No medications at this time.

## 2021-07-03 ENCOUNTER — Other Ambulatory Visit: Payer: Self-pay

## 2021-07-03 ENCOUNTER — Encounter (HOSPITAL_COMMUNITY): Payer: Self-pay

## 2021-07-03 ENCOUNTER — Encounter (HOSPITAL_COMMUNITY)
Admission: RE | Admit: 2021-07-03 | Discharge: 2021-07-03 | Disposition: A | Discharge status: Home / Self Care | Payer: Medicare Other | Source: Ambulatory Visit | Attending: Gynecologic Oncology | Admitting: Gynecologic Oncology

## 2021-07-03 DIAGNOSIS — C541 Malignant neoplasm of endometrium: Secondary | ICD-10-CM | POA: Diagnosis not present

## 2021-07-03 DIAGNOSIS — Z01818 Encounter for other preprocedural examination: Secondary | ICD-10-CM | POA: Insufficient documentation

## 2021-07-03 HISTORY — DX: Gastro-esophageal reflux disease without esophagitis: K21.9

## 2021-07-03 HISTORY — DX: Prediabetes: R73.03

## 2021-07-03 LAB — CBC
HCT: 38.3 % (ref 36.0–46.0)
Hemoglobin: 11.9 g/dL — ABNORMAL LOW (ref 12.0–15.0)
MCH: 26.9 pg (ref 26.0–34.0)
MCHC: 31.1 g/dL (ref 30.0–36.0)
MCV: 86.5 fL (ref 80.0–100.0)
Platelets: 297 10*3/uL (ref 150–400)
RBC: 4.43 MIL/uL (ref 3.87–5.11)
RDW: 15.2 % (ref 11.5–15.5)
WBC: 7 10*3/uL (ref 4.0–10.5)
nRBC: 0 % (ref 0.0–0.2)

## 2021-07-03 LAB — COMPREHENSIVE METABOLIC PANEL
ALT: 16 U/L (ref 0–44)
AST: 22 U/L (ref 15–41)
Albumin: 4 g/dL (ref 3.5–5.0)
Alkaline Phosphatase: 66 U/L (ref 38–126)
Anion gap: 9 (ref 5–15)
BUN: 12 mg/dL (ref 8–23)
CO2: 27 mmol/L (ref 22–32)
Calcium: 9.3 mg/dL (ref 8.9–10.3)
Chloride: 104 mmol/L (ref 98–111)
Creatinine, Ser: 0.97 mg/dL (ref 0.44–1.00)
GFR, Estimated: >60 mL/min (ref >60)
Glucose, Bld: 116 mg/dL — ABNORMAL HIGH (ref 70–99)
Potassium: 3.7 mmol/L (ref 3.5–5.1)
Sodium: 140 mmol/L (ref 135–145)
Total Bilirubin: 0.5 mg/dL (ref 0.3–1.2)
Total Protein: 7.8 g/dL (ref 6.5–8.1)

## 2021-07-03 LAB — GLUCOSE, CAPILLARY: Glucose-Capillary: 124 mg/dL — ABNORMAL HIGH (ref 70–99)

## 2021-07-04 DIAGNOSIS — C541 Malignant neoplasm of endometrium: Secondary | ICD-10-CM

## 2021-07-04 HISTORY — DX: Malignant neoplasm of endometrium: C54.1

## 2021-07-04 NOTE — Progress Notes (Signed)
Internal Medicine Clinic Attending ° °Case discussed with Dr. Gawaluck  At the time of the visit.  We reviewed the resident’s history and exam and pertinent patient test results.  I agree with the assessment, diagnosis, and plan of care documented in the resident’s note.  °

## 2021-07-09 ENCOUNTER — Telehealth: Payer: Self-pay

## 2021-07-09 NOTE — Telephone Encounter (Signed)
Telephone call to check on pre-operative status.  Patient compliant with pre-operative instructions.  Reinforced nothing to eat after midnight. Clear liquids until 0800 am. Patient to arrive at [facility] .  No questions or concerns voiced.  Instructed to call for any needs.

## 2021-07-10 ENCOUNTER — Encounter (HOSPITAL_COMMUNITY): Payer: Self-pay | Admitting: Gynecologic Oncology

## 2021-07-10 ENCOUNTER — Encounter (HOSPITAL_COMMUNITY)
Admission: RE | Disposition: A | Discharge status: Home / Self Care | Payer: Self-pay | Source: Ambulatory Visit | Attending: Gynecologic Oncology

## 2021-07-10 ENCOUNTER — Ambulatory Visit (HOSPITAL_COMMUNITY): Payer: Medicare Other | Admitting: Certified Registered Nurse Anesthetist

## 2021-07-10 ENCOUNTER — Other Ambulatory Visit: Payer: Self-pay

## 2021-07-10 ENCOUNTER — Ambulatory Visit (HOSPITAL_COMMUNITY)
Admission: RE | Admit: 2021-07-10 | Discharge: 2021-07-10 | Disposition: A | Discharge status: Home / Self Care | Payer: Medicare Other | Source: Ambulatory Visit | Attending: Gynecologic Oncology | Admitting: Gynecologic Oncology

## 2021-07-10 DIAGNOSIS — K219 Gastro-esophageal reflux disease without esophagitis: Secondary | ICD-10-CM | POA: Insufficient documentation

## 2021-07-10 DIAGNOSIS — Z8711 Personal history of peptic ulcer disease: Secondary | ICD-10-CM | POA: Insufficient documentation

## 2021-07-10 DIAGNOSIS — Z87891 Personal history of nicotine dependence: Secondary | ICD-10-CM | POA: Diagnosis not present

## 2021-07-10 DIAGNOSIS — N950 Postmenopausal bleeding: Secondary | ICD-10-CM | POA: Insufficient documentation

## 2021-07-10 DIAGNOSIS — I10 Essential (primary) hypertension: Secondary | ICD-10-CM | POA: Diagnosis not present

## 2021-07-10 DIAGNOSIS — J45909 Unspecified asthma, uncomplicated: Secondary | ICD-10-CM | POA: Diagnosis not present

## 2021-07-10 DIAGNOSIS — Z6841 Body Mass Index (BMI) 40.0 and over, adult: Secondary | ICD-10-CM | POA: Diagnosis not present

## 2021-07-10 DIAGNOSIS — C541 Malignant neoplasm of endometrium: Secondary | ICD-10-CM | POA: Diagnosis not present

## 2021-07-10 HISTORY — PX: ROBOTIC ASSISTED TOTAL HYSTERECTOMY WITH BILATERAL SALPINGO OOPHERECTOMY: SHX6086

## 2021-07-10 HISTORY — PX: SENTINEL NODE BIOPSY: SHX6608

## 2021-07-10 LAB — TYPE AND SCREEN
ABO/RH(D): O POS
Antibody Screen: NEGATIVE

## 2021-07-10 LAB — ABO/RH: ABO/RH(D): O POS

## 2021-07-10 SURGERY — HYSTERECTOMY, TOTAL, ROBOT-ASSISTED, LAPAROSCOPIC, WITH BILATERAL SALPINGO-OOPHORECTOMY
Anesthesia: General

## 2021-07-10 MED ORDER — ORAL CARE MOUTH RINSE: 15.0000 mL | Freq: Once | OROMUCOSAL | Status: AC

## 2021-07-10 MED ORDER — ONDANSETRON HCL 4 MG/2ML IJ SOLN
INTRAMUSCULAR | Status: DC | PRN
  Administered 2021-07-10: 4 mg via INTRAVENOUS

## 2021-07-10 MED ORDER — LIDOCAINE HCL (PF) 2 % IJ SOLN
INTRAMUSCULAR | Status: AC
  Filled 2021-07-10: qty 5

## 2021-07-10 MED ORDER — MIDAZOLAM HCL 5 MG/5ML IJ SOLN
INTRAMUSCULAR | Status: DC | PRN
Start: 2021-07-10 — End: 2021-07-10
  Administered 2021-07-10 (×2): 1 mg via INTRAVENOUS

## 2021-07-10 MED ORDER — ROCURONIUM BROMIDE 10 MG/ML (PF) SYRINGE
PREFILLED_SYRINGE | INTRAVENOUS | Status: AC
  Filled 2021-07-10: qty 10

## 2021-07-10 MED ORDER — STERILE WATER FOR INJECTION IJ SOLN
INTRAMUSCULAR | Status: DC | PRN
  Administered 2021-07-10: 10 mL

## 2021-07-10 MED ORDER — PROPOFOL 10 MG/ML IV BOLUS
INTRAVENOUS | Status: AC
  Filled 2021-07-10: qty 20

## 2021-07-10 MED ORDER — BUPIVACAINE HCL 0.25 % IJ SOLN
INTRAMUSCULAR | Status: AC
  Filled 2021-07-10: qty 1

## 2021-07-10 MED ORDER — LEVONORGESTREL 20 MCG/DAY IU IUD
1.0000 | INTRAUTERINE_SYSTEM | INTRAUTERINE | Status: DC
  Filled 2021-07-10: qty 1

## 2021-07-10 MED ORDER — ONDANSETRON HCL 4 MG/2ML IJ SOLN
INTRAMUSCULAR | Status: AC
  Filled 2021-07-10: qty 2

## 2021-07-10 MED ORDER — BUPIVACAINE HCL 0.25 % IJ SOLN
INTRAMUSCULAR | Status: DC | PRN
  Administered 2021-07-10: 33 mL

## 2021-07-10 MED ORDER — CEFAZOLIN IN SODIUM CHLORIDE 3-0.9 GM/100ML-% IV SOLN
3.0000 g | INTRAVENOUS | Status: AC
  Administered 2021-07-10: 3 g via INTRAVENOUS
  Filled 2021-07-10: qty 100

## 2021-07-10 MED ORDER — ACETAMINOPHEN 500 MG PO TABS
1000.0000 mg | ORAL_TABLET | ORAL | Status: AC
  Administered 2021-07-10: 1000 mg via ORAL
  Filled 2021-07-10: qty 2

## 2021-07-10 MED ORDER — HEPARIN SODIUM (PORCINE) 5000 UNIT/ML IJ SOLN
5000.0000 [IU] | INTRAMUSCULAR | Status: AC
  Administered 2021-07-10: 5000 [IU] via SUBCUTANEOUS
  Filled 2021-07-10: qty 1

## 2021-07-10 MED ORDER — DEXAMETHASONE SODIUM PHOSPHATE 4 MG/ML IJ SOLN: 4.0000 mg | INTRAMUSCULAR | Status: DC

## 2021-07-10 MED ORDER — PROPOFOL 10 MG/ML IV BOLUS
INTRAVENOUS | Status: DC | PRN
  Administered 2021-07-10: 160 mg via INTRAVENOUS

## 2021-07-10 MED ORDER — LACTATED RINGERS IV SOLN: INTRAVENOUS | Status: DC

## 2021-07-10 MED ORDER — STERILE WATER FOR INJECTION IJ SOLN
INTRAMUSCULAR | Status: AC
  Filled 2021-07-10: qty 10

## 2021-07-10 MED ORDER — DEXAMETHASONE SODIUM PHOSPHATE 10 MG/ML IJ SOLN
INTRAMUSCULAR | Status: AC
  Filled 2021-07-10: qty 1

## 2021-07-10 MED ORDER — OXYCODONE HCL 5 MG/5ML PO SOLN: 5.0000 mg | Freq: Once | ORAL | Status: DC | PRN

## 2021-07-10 MED ORDER — PROMETHAZINE HCL 25 MG/ML IJ SOLN
6.2500 mg | INTRAMUSCULAR | Status: AC | PRN
  Administered 2021-07-10 (×2): 6.25 mg via INTRAVENOUS

## 2021-07-10 MED ORDER — PROMETHAZINE HCL 25 MG/ML IJ SOLN
INTRAMUSCULAR | Status: AC
  Filled 2021-07-10: qty 1

## 2021-07-10 MED ORDER — MIDAZOLAM HCL 2 MG/2ML IJ SOLN
INTRAMUSCULAR | Status: AC
  Filled 2021-07-10: qty 2

## 2021-07-10 MED ORDER — FENTANYL CITRATE PF 50 MCG/ML IJ SOSY
PREFILLED_SYRINGE | INTRAMUSCULAR | Status: AC
  Filled 2021-07-10: qty 2

## 2021-07-10 MED ORDER — LIDOCAINE HCL (CARDIAC) PF 100 MG/5ML IV SOSY
PREFILLED_SYRINGE | INTRAVENOUS | Status: DC | PRN
  Administered 2021-07-10: 80 mg via INTRAVENOUS

## 2021-07-10 MED ORDER — LACTATED RINGERS IR SOLN
Status: DC | PRN
  Administered 2021-07-10: 1000 mL

## 2021-07-10 MED ORDER — ENSURE PRE-SURGERY PO LIQD
296.0000 mL | Freq: Once | ORAL | Status: DC
  Filled 2021-07-10: qty 296

## 2021-07-10 MED ORDER — STERILE WATER FOR IRRIGATION IR SOLN
Status: DC | PRN
  Administered 2021-07-10: 1000 mL

## 2021-07-10 MED ORDER — CHLORHEXIDINE GLUCONATE 0.12 % MT SOLN
15.0000 mL | Freq: Once | OROMUCOSAL | Status: AC
  Administered 2021-07-10: 15 mL via OROMUCOSAL

## 2021-07-10 MED ORDER — FENTANYL CITRATE (PF) 250 MCG/5ML IJ SOLN
INTRAMUSCULAR | Status: AC
  Filled 2021-07-10: qty 5

## 2021-07-10 MED ORDER — INDOCYANINE GREEN 25 MG IV SOLR
INTRAVENOUS | Status: DC | PRN
  Administered 2021-07-10: 2.5 mg

## 2021-07-10 MED ORDER — ROCURONIUM BROMIDE 100 MG/10ML IV SOLN
INTRAVENOUS | Status: DC | PRN
Start: 2021-07-10 — End: 2021-07-10
  Administered 2021-07-10: 70 mg via INTRAVENOUS
  Administered 2021-07-10: 20 mg via INTRAVENOUS

## 2021-07-10 MED ORDER — OXYCODONE HCL 5 MG PO TABS: 5.0000 mg | ORAL_TABLET | Freq: Once | ORAL | Status: DC | PRN

## 2021-07-10 MED ORDER — FENTANYL CITRATE PF 50 MCG/ML IJ SOSY
25.0000 ug | PREFILLED_SYRINGE | INTRAMUSCULAR | Status: DC | PRN
  Administered 2021-07-10 (×2): 50 ug via INTRAVENOUS

## 2021-07-10 MED ORDER — DEXAMETHASONE SODIUM PHOSPHATE 10 MG/ML IJ SOLN
INTRAMUSCULAR | Status: DC | PRN
Start: 2021-07-10 — End: 2021-07-10
  Administered 2021-07-10: 8 mg via INTRAVENOUS

## 2021-07-10 MED ORDER — FENTANYL CITRATE (PF) 100 MCG/2ML IJ SOLN
INTRAMUSCULAR | Status: DC | PRN
  Administered 2021-07-10: 50 ug via INTRAVENOUS
  Administered 2021-07-10: 100 ug via INTRAVENOUS
  Administered 2021-07-10 (×2): 50 ug via INTRAVENOUS

## 2021-07-10 SURGICAL SUPPLY — 91 items
ADH SKN CLS APL DERMABOND .7 (GAUZE/BANDAGES/DRESSINGS) ×2
AGENT HMST KT MTR STRL THRMB (HEMOSTASIS)
APL ESCP 34 STRL LF DISP (HEMOSTASIS)
APPLICATOR SURGIFLO ENDO (HEMOSTASIS) IMPLANT
BACTOSHIELD CHG 4% 4OZ (MISCELLANEOUS) ×1
BAG COUNTER SPONGE SURGICOUNT (BAG) IMPLANT
BAG LAPAROSCOPIC 12 15 PORT 16 (BASKET) IMPLANT
BAG RETRIEVAL 12/15 (BASKET) ×3
BAG SPEC RTRVL LRG 6X4 10 (ENDOMECHANICALS)
BAG SPNG CNTER NS LX DISP (BAG)
BLADE SURG SZ10 CARB STEEL (BLADE) IMPLANT
CATH ROBINSON RED A/P 16FR (CATHETERS) ×3 IMPLANT
COVER BACK TABLE 60X90IN (DRAPES) ×4 IMPLANT
COVER TIP SHEARS 8 DVNC (MISCELLANEOUS) ×3 IMPLANT
COVER TIP SHEARS 8MM DA VINCI (MISCELLANEOUS) ×3
DERMABOND ADVANCED (GAUZE/BANDAGES/DRESSINGS) ×1
DERMABOND ADVANCED .7 DNX12 (GAUZE/BANDAGES/DRESSINGS) ×3 IMPLANT
DRAPE ARM DVNC X/XI (DISPOSABLE) ×12 IMPLANT
DRAPE COLUMN DVNC XI (DISPOSABLE) ×3 IMPLANT
DRAPE DA VINCI XI ARM (DISPOSABLE) ×12
DRAPE DA VINCI XI COLUMN (DISPOSABLE) ×3
DRAPE SHEET LG 3/4 BI-LAMINATE (DRAPES) ×4 IMPLANT
DRAPE SURG IRRIG POUCH 19X23 (DRAPES) ×4 IMPLANT
DRAPE UNDERBUTTOCKS STRL (DISPOSABLE) ×3 IMPLANT
DRSG OPSITE POSTOP 4X6 (GAUZE/BANDAGES/DRESSINGS) IMPLANT
DRSG OPSITE POSTOP 4X8 (GAUZE/BANDAGES/DRESSINGS) IMPLANT
DRSG TELFA 3X8 NADH (GAUZE/BANDAGES/DRESSINGS) IMPLANT
ELECT PENCIL ROCKER SW 15FT (MISCELLANEOUS) IMPLANT
ELECT REM PT RETURN 15FT ADLT (MISCELLANEOUS) ×4 IMPLANT
GAUZE 4X4 16PLY ~~LOC~~+RFID DBL (SPONGE) ×4 IMPLANT
GLOVE SURG ENC MOIS LTX SZ6 (GLOVE) ×16 IMPLANT
GLOVE SURG ENC MOIS LTX SZ6.5 (GLOVE) ×8 IMPLANT
GOWN STRL REUS W/ TWL LRG LVL3 (GOWN DISPOSABLE) ×12 IMPLANT
GOWN STRL REUS W/TWL LRG LVL3 (GOWN DISPOSABLE) ×12
HOLDER FOLEY CATH W/STRAP (MISCELLANEOUS) IMPLANT
IRRIG SUCT STRYKERFLOW 2 WTIP (MISCELLANEOUS) ×3
IRRIGATION SUCT STRKRFLW 2 WTP (MISCELLANEOUS) ×3 IMPLANT
KIT BASIN OR (CUSTOM PROCEDURE TRAY) ×3 IMPLANT
KIT PROCEDURE DA VINCI SI (MISCELLANEOUS) ×3
KIT PROCEDURE DVNC SI (MISCELLANEOUS) IMPLANT
KIT TURNOVER KIT A (KITS) IMPLANT
MANIPULATOR ADVINCU DEL 3.0 PL (MISCELLANEOUS) IMPLANT
MANIPULATOR ADVINCU DEL 3.5 PL (MISCELLANEOUS) ×1 IMPLANT
MANIPULATOR UTERINE 4.5 ZUMI (MISCELLANEOUS) IMPLANT
NDL HYPO 21X1.5 SAFETY (NEEDLE) ×3 IMPLANT
NDL SPNL 18GX3.5 QUINCKE PK (NEEDLE) IMPLANT
NDL SPNL 22GX3.5 QUINCKE BK (NEEDLE) ×3 IMPLANT
NEEDLE HYPO 21X1.5 SAFETY (NEEDLE) ×3 IMPLANT
NEEDLE SPNL 18GX3.5 QUINCKE PK (NEEDLE) IMPLANT
NEEDLE SPNL 22GX3.5 QUINCKE BK (NEEDLE) IMPLANT
NS IRRIG 1000ML POUR BTL (IV SOLUTION) ×3 IMPLANT
OBTURATOR OPTICAL STANDARD 8MM (TROCAR) ×3
OBTURATOR OPTICAL STND 8 DVNC (TROCAR) ×2
OBTURATOR OPTICALSTD 8 DVNC (TROCAR) ×3 IMPLANT
PACK LITHOTOMY IV (CUSTOM PROCEDURE TRAY) ×3 IMPLANT
PACK ROBOT GYN CUSTOM WL (TRAY / TRAY PROCEDURE) ×4 IMPLANT
PAD DRESSING TELFA 3X8 NADH (GAUZE/BANDAGES/DRESSINGS) ×3 IMPLANT
PAD OB MATERNITY 4.3X12.25 (PERSONAL CARE ITEMS) ×3 IMPLANT
PAD POSITIONING PINK XL (MISCELLANEOUS) ×4 IMPLANT
PENCIL SMOKE EVACUATOR (MISCELLANEOUS) IMPLANT
PORT ACCESS TROCAR AIRSEAL 12 (TROCAR) ×3 IMPLANT
PORT ACCESS TROCAR AIRSEAL 5M (TROCAR) ×1
POUCH SPECIMEN RETRIEVAL 10MM (ENDOMECHANICALS) IMPLANT
SCISSORS LAP 5X45 EPIX DISP (ENDOMECHANICALS) ×1 IMPLANT
SCRUB CHG 4% DYNA-HEX 4OZ (MISCELLANEOUS) ×3 IMPLANT
SEAL CANN UNIV 5-8 DVNC XI (MISCELLANEOUS) ×12 IMPLANT
SEAL XI 5MM-8MM UNIVERSAL (MISCELLANEOUS) ×12
SET TRI-LUMEN FLTR TB AIRSEAL (TUBING) ×4 IMPLANT
SPIKE FLUID TRANSFER (MISCELLANEOUS) ×4 IMPLANT
SPONGE T-LAP 18X18 ~~LOC~~+RFID (SPONGE) IMPLANT
SURGIFLO W/THROMBIN 8M KIT (HEMOSTASIS) IMPLANT
SURGILUBE 2OZ TUBE FLIPTOP (MISCELLANEOUS) ×3 IMPLANT
SUT MNCRL AB 4-0 PS2 18 (SUTURE) IMPLANT
SUT PDS AB 1 TP1 96 (SUTURE) IMPLANT
SUT VIC AB 0 CT1 27 (SUTURE)
SUT VIC AB 0 CT1 27XBRD ANTBC (SUTURE) IMPLANT
SUT VIC AB 2-0 CT1 27 (SUTURE) ×3
SUT VIC AB 2-0 CT1 TAPERPNT 27 (SUTURE) IMPLANT
SUT VIC AB 2-0 SH 27 (SUTURE) ×3
SUT VIC AB 2-0 SH 27X BRD (SUTURE) IMPLANT
SUT VIC AB 4-0 PS2 18 (SUTURE) ×8 IMPLANT
SYR 10ML LL (SYRINGE) IMPLANT
SYR BULB IRRIG 60ML STRL (SYRINGE) ×3 IMPLANT
TOWEL OR 17X26 10 PK STRL BLUE (TOWEL DISPOSABLE) ×3 IMPLANT
TOWEL OR NON WOVEN STRL DISP B (DISPOSABLE) ×4 IMPLANT
TRAP SPECIMEN MUCUS 40CC (MISCELLANEOUS) IMPLANT
TRAY FOLEY MTR SLVR 16FR STAT (SET/KITS/TRAYS/PACK) ×4 IMPLANT
TROCAR XCEL NON-BLD 5MMX100MML (ENDOMECHANICALS) IMPLANT
UNDERPAD 30X36 HEAVY ABSORB (UNDERPADS AND DIAPERS) ×8 IMPLANT
WATER STERILE IRR 1000ML POUR (IV SOLUTION) ×4 IMPLANT
YANKAUER SUCT BULB TIP 10FT TU (MISCELLANEOUS) IMPLANT

## 2021-07-10 NOTE — Op Note (Signed)
OPERATIVE NOTE  Pre-operative Diagnosis: endometrial cancer grade 1  Post-operative Diagnosis: same  Operation: Robotic-assisted laparoscopic total hysterectomy with bilateral salpingoophorectomy, SLN biopsy, reduction of umbilical hernia   Surgeon: Jeral Pinch MD  Assistant Surgeon: Lahoma Crocker MD (an MD assistant was necessary for tissue manipulation, management of robotic instrumentation, retraction and positioning due to the complexity of the case and hospital policies).   Anesthesia: GET  Urine Output: 200cc  Operative Findings:  On EUA, 10cm mobile uterus. On intra-abdominal entry, normal upper abdominal survey. Small amount of omentum within the umbilical hernia, easily reduced. Normal small and large bowel. Normal omentum. 12 cm bulbous uterus. Normal bilateral adnexa. Mapping successful to right common and left external iliac SLNs. No intra-abdominal or pelvic evidence of disease.   Estimated Blood Loss:  75      Total IV Fluids: see I&O flowsshet         Specimens: uterus, cervix, bilateral tubes and ovaries, right common iliac SLN, left external iliac SLN         Complications:  None apparent; patient tolerated the procedure well.         Disposition: PACU - hemodynamically stable.  Procedure Details  The patient was seen in the Holding Room. The risks, benefits, complications, treatment options, and expected outcomes were discussed with the patient.  The patient concurred with the proposed plan, giving informed consent.  The site of surgery properly noted/marked. The patient was identified as [name] and the procedure verified as a Robotic-assisted hysterectomy with bilateral salpingo oophorectomy with SLN biopsy.   After induction of anesthesia, the patient was draped and prepped in the usual sterile manner. Patient was placed in supine position after anesthesia and draped and prepped in the usual sterile manner as follows: Her arms were tucked to her  side with all appropriate precautions.  The shoulders were stabilized with padded shoulder blocks applied to the acromium processes.  The patient was placed in the semi-lithotomy position in [place].  The perineum and vagina were prepped with CholoraPrep. The patient was draped after the CholoraPrep had been allowed to dry for 3 minutes.  A Time Out was held and the above information confirmed.  The urethra was prepped with Betadine. Foley catheter was placed.  A sterile speculum was placed in the vagina.  The cervix was grasped with a single-tooth tenaculum. 2mg  total of ICG was injected into the cervical stroma at 2 and 9 o'clock with 1cc injected at a 1cm and 57mm depth (concentration 0.5mg /ml) in all locations. The cervix was dilated with Kennon Rounds dilators.  The 3.5 Delineator uterine manipulator with a colpotomizer ring was placed without difficulty.  A pneum occluder balloon was placed over the manipulator.  OG tube placement was confirmed and to suction.   Next, a 10 mm skin incision was made 1 cm below the subcostal margin in the midclavicular line.  The 5 mm Optiview port and scope was used for direct entry.  Opening pressure was under 10 mm CO2.  The abdomen was insufflated and the findings were noted as above.   At this point and all points during the procedure, the patient's intra-abdominal pressure did not exceed 15 mmHg. Next, an 8 mm skin incision was made inferior to the umbilicus and a right and left port were placed about 8 cm lateral to the robot port on the right and left side.  A fourth arm was placed on the right.  The 5 mm assist trocar was exchanged for  a 10-12 mm port. All ports were placed under direct visualization.  The patient was placed in steep Trendelenburg.  The omentum was grasped and easily reduced from the hernia. Bowel was folded away into the upper abdomen.  The robot was docked in the normal manner.  The right and left peritoneum were opened parallel to the IP ligament  to open the retroperitoneal spaces bilaterally. The round ligaments were transected. The SLN mapping was performed in bilateral pelvic basins. After identifying the ureters, the para rectal and paravesical spaces were opened up entirely with careful dissection below the level of the ureters bilaterally and to the depth of the uterine artery origin in order to skeletonize the uterine "web" and ensure visualization of all parametrial channels. The para-aortic basins were carefully exposed and evaluated for isolated para-aortic SLN's. Lymphatic channels were identified travelling to the following visualized sentinel lymph node's: right common iliac and left external iliac SLN. These SLN's were separated from their surrounding lymphatic tissue, removed and sent for permanent pathology.  The hysterectomy was started.  The ureter was again noted to be on the medial leaf of the broad ligament.  The peritoneum above the ureter was incised and stretched and the infundibulopelvic ligament was skeletonized, cauterized and cut.  The posterior peritoneum was taken down to the level of the KOH ring.  The anterior peritoneum was also taken down.  The bladder flap was created to the level of the KOH ring.  The uterine artery on the right side was skeletonized, cauterized and cut in the normal manner.  A similar procedure was performed on the left.  The colpotomy was made and the uterus, cervix, bilateral ovaries and tubes were amputated and delivered through the vagina.  Pedicles were inspected and excellent hemostasis was achieved.    Tear was noted near bilateral vaginal apices, along posterior wall. 2-0 Vicryl was used to close these defects and achieve hemostasis. The colpotomy at the vaginal cuff was closed with Vicryl on a CT1 needle in running manner.  Irrigation was used and excellent hemostasis was achieved.  At this point in the procedure was completed.  Robotic instruments were removed under direct visulaization.   The robot was undocked. The fascia at the 10-12 mm port was closed with 0 Vicryl on a UR-5 needle.  The subcuticular tissue was closed with 4-0 Vicryl and the skin was closed with 4-0 Monocryl in a subcuticular manner.  Dermabond was applied.    The vagina was swabbed with some bleeding noted. On exam, there was some bleeding from the hymen as well as an abrasion on the right distal lateral vagina. This was made hemostatic with a running suture of 2-0 Vicryl. The vagina was then swabbed again with minimal bleeding noted.   All sponge, lap and needle counts were correct x  3.   The patient was transferred to the recovery room in stable condition.  Jeral Pinch, MD

## 2021-07-10 NOTE — Interval H&P Note (Signed)
History and Physical Interval Note:  07/10/2021 9:41 AM  [name]  has presented today for surgery, with the diagnosis of ENDOMETRIAL CANCER.  The various methods of treatment have been discussed with the patient and family. After consideration of risks, benefits and other options for treatment, the patient has consented to  Procedure(s): XI ROBOTIC ASSISTED TOTAL HYSTERECTOMY WITH BILATERAL SALPINGO OOPHORECTOMY, POSSIBLE LAPARTOMY (Bilateral) SENTINEL NODE BIOPSY (N/A) POSSIBLE LYMPH NODE DISSECTION (N/A) POSSIBLE ATATION AND CURETTAGE (N/A) POSSIBLE INTRAUTERINE DEVICE (IUD) INSERTION (N/A) as a surgical intervention.  The patient's history has been reviewed, patient examined, no change in status, stable for surgery.  I have reviewed the patient's chart and labs.  Questions were answered to the patient's satisfaction.     [name]

## 2021-07-10 NOTE — Anesthesia Procedure Notes (Signed)
Procedure Name: Intubation Date/Time: 07/10/2021 10:32 AM Performed by: [place] Keeleigh Terris C, CRNA Pre-anesthesia Checklist: Patient identified, Emergency Drugs available, Suction available and Patient being monitored Patient Re-evaluated:Patient Re-evaluated prior to induction Oxygen Delivery Method: Circle system utilized Preoxygenation: Pre-oxygenation with 100% oxygen Induction Type: IV induction Ventilation: Mask ventilation without difficulty Laryngoscope Size: Mac and 3 Grade View: Grade I Tube type: Oral Tube size: 7.0 mm Number of attempts: 1 Airway Equipment and Method: Stylet and Oral airway Placement Confirmation: ETT inserted through vocal cords under direct vision, positive ETCO2 and breath sounds checked- equal and bilateral Secured at: 21 cm Tube secured with: Tape Dental Injury: Teeth and Oropharynx as per pre-operative assessment

## 2021-07-10 NOTE — Discharge Instructions (Addendum)

## 2021-07-10 NOTE — Transfer of Care (Signed)
Immediate Anesthesia Transfer of Care Note  Patient: [name]  Procedure(s) Performed: XI ROBOTIC ASSISTED TOTAL HYSTERECTOMY WITH BILATERAL SALPINGO OOPHORECTOMY (Bilateral) SENTINEL NODE BIOPSY  Patient Location: PACU  Anesthesia Type:General  Level of Consciousness: awake, alert  and drowsy  Airway & Oxygen Therapy: Patient Spontanous Breathing and Patient connected to face mask oxygen  Post-op Assessment: Report given to RN and Post -op Vital signs reviewed and stable  Post vital signs: Reviewed and stable  Last Vitals:  Vitals Value Taken Time  BP 160/104 07/10/21 1345  Temp    Pulse 83 07/10/21 1346  Resp 23 07/10/21 1346  SpO2 93 % 07/10/21 1346  Vitals shown include unvalidated device data.  Last Pain:  Vitals:   07/10/21 0921  TempSrc:   PainSc: 0-No pain         Complications: No notable events documented.

## 2021-07-10 NOTE — Anesthesia Postprocedure Evaluation (Signed)
Anesthesia Post Note  Patient: [name]  Procedure(s) Performed: XI ROBOTIC ASSISTED TOTAL HYSTERECTOMY WITH BILATERAL SALPINGO OOPHORECTOMY (Bilateral) SENTINEL NODE BIOPSY     Patient location during evaluation: PACU Anesthesia Type: General Level of consciousness: awake and alert Pain management: pain level controlled Vital Signs Assessment: post-procedure vital signs reviewed and stable Respiratory status: spontaneous breathing, nonlabored ventilation, respiratory function stable and patient connected to nasal cannula oxygen Cardiovascular status: blood pressure returned to baseline and stable Postop Assessment: no apparent nausea or vomiting Anesthetic complications: no   No notable events documented.  Last Vitals:  Vitals:   07/10/21 1551 07/10/21 1600  BP: (!) 150/79 (!) 150/79  Pulse: 75   Resp: 16   Temp:    SpO2: 97%     Last Pain:  Vitals:   07/10/21 1551  TempSrc:   PainSc: 2                  March Rummage Esmay Amspacher

## 2021-07-10 NOTE — Anesthesia Preprocedure Evaluation (Addendum)
Anesthesia Evaluation  Patient identified by MRN, date of birth, ID band Patient awake    Reviewed: Allergy & Precautions, NPO status , Patient's Chart, lab work & pertinent test results  Airway Mallampati: II  TM Distance: >3 FB Neck ROM: Full    Dental  (+) Chipped   Pulmonary asthma , former smoker,    Pulmonary exam normal        Cardiovascular hypertension, Pt. on medications  Rhythm:Regular Rate:Normal     Neuro/Psych negative neurological ROS  negative psych ROS   GI/Hepatic Neg liver ROS, PUD, GERD  ,  Endo/Other  Morbid obesity  Renal/GU negative Renal ROS  Female GU complaint Endometrial Ca    Musculoskeletal negative musculoskeletal ROS (+)   Abdominal Normal abdominal exam  (+)   Peds  Hematology  (+) Blood dyscrasia, anemia ,   Anesthesia Other Findings   Reproductive/Obstetrics                            Anesthesia Physical Anesthesia Plan  ASA: 3  Anesthesia Plan: General   Post-op Pain Management:    Induction: Intravenous  PONV Risk Score and Plan: 3 and Ondansetron, Dexamethasone and Treatment may vary due to age or medical condition  Airway Management Planned: Mask and Oral ETT  Additional Equipment: None  Intra-op Plan:   Post-operative Plan: Extubation in OR  Informed Consent: I have reviewed the patients History and Physical, chart, labs and discussed the procedure including the risks, benefits and alternatives for the proposed anesthesia with the patient or authorized representative who has indicated his/her understanding and acceptance.     Dental advisory given  Plan Discussed with: CRNA  Anesthesia Plan Comments: (Lab Results      Component                Value               Date                      WBC                      7.0                 07/03/2021                HGB                      11.9 (L)            07/03/2021                 HCT                      38.3                07/03/2021                MCV                      86.5                07/03/2021                PLT                      297  07/03/2021           Lab Results      Component                Value               Date                      NA                       140                 07/03/2021                K                        3.7                 07/03/2021                CO2                      27                  07/03/2021                GLUCOSE                  116 (H)             07/03/2021                BUN                      12                  07/03/2021                CREATININE               0.97                07/03/2021                CALCIUM                  9.3                 07/03/2021                EGFR                     70                  02/20/2021                GFRNONAA                 >60                 07/03/2021           )       Anesthesia Quick Evaluation

## 2021-07-11 ENCOUNTER — Telehealth: Payer: Self-pay

## 2021-07-11 ENCOUNTER — Encounter (HOSPITAL_COMMUNITY): Payer: Self-pay | Admitting: Gynecologic Oncology

## 2021-07-11 NOTE — Telephone Encounter (Signed)
Spoke with Ms. Hoopingarner this morning. She states she is eating, drinking and urinating well. She has not had a BM yet but is passing gas. She forgot to take her senokot last night. Encouraged her to drink plenty of water and take senokot tonight. She reports she has taken a shower this morning and has been up and walking. She denies fever or chills. Incisions are dry and intact. She reports a small amount of drainage from incision on her left side last night but no drainage today. She rates her pain 0/10. Her pain is controlled with tylenol. She states she has not needed the ibuprofen and feels comfortable with the tylenol. " I have soreness but it is not bad."    Instructed to call office with any fever, chills, purulent drainage, uncontrolled pain or any other questions or concerns. Patient verbalizes understanding.   Pt aware of post op appointments as well as the office number [phone] and after hours number [phone] to call if she has any questions or concerns

## 2021-07-13 LAB — SURGICAL PATHOLOGY

## 2021-07-17 ENCOUNTER — Inpatient Hospital Stay: Payer: Medicare Other | Attending: Gynecologic Oncology | Admitting: Gynecologic Oncology

## 2021-07-17 ENCOUNTER — Encounter: Payer: Self-pay | Admitting: Gynecologic Oncology

## 2021-07-17 DIAGNOSIS — Z7189 Other specified counseling: Secondary | ICD-10-CM

## 2021-07-17 DIAGNOSIS — Z90710 Acquired absence of both cervix and uterus: Secondary | ICD-10-CM

## 2021-07-17 DIAGNOSIS — C541 Malignant neoplasm of endometrium: Secondary | ICD-10-CM

## 2021-07-17 DIAGNOSIS — Z90722 Acquired absence of ovaries, bilateral: Secondary | ICD-10-CM

## 2021-07-17 NOTE — Progress Notes (Signed)
Gynecologic Oncology Telehealth Consult Note: Gyn-Onc  I connected with [name] on 07/17/21 at  4:20 PM EST by telephone and verified that I am speaking with the correct person using two identifiers.  I discussed the limitations, risks, security and privacy concerns of performing an evaluation and management service by telemedicine and the availability of in-person appointments. I also discussed with the patient that there may be a patient responsible charge related to this service. The patient expressed understanding and agreed to proceed.  Other persons participating in the visit and their role in the encounter: Patient's daughter.  Patient's location: Home Provider's location: [facility]  Reason for Visit: Follow-up after surgery, treatment discussion  Treatment History: Oncology History  Endometrial cancer, grade I ([place])  05/30/2021 Initial Biopsy   Endometrial biopsy shows FIGO grade 1 endometrioid adenocarcinoma   06/06/2021 Initial Diagnosis   Endometrial cancer, grade I ([place])   07/10/2021 Surgery   TRH/BSO, bilateral SLN bx, reduction of umbilical hernia  Findings:  On EUA, 10cm mobile uterus. On intra-abdominal entry, normal upper abdominal survey. Small amount of omentum within the umbilical hernia, easily reduced. Normal small and large bowel. Normal omentum. 12 cm bulbous uterus. Normal bilateral adnexa. Mapping successful to right common and left external iliac SLNs. No intra-abdominal or pelvic evidence of disease.    07/10/2021 Pathology Results   Stage IA, grade 1 endometrioid adenocarcinoma MI: 25% No LVI SLNs negative     Interval History: Patient reports doing well after surgery.  She denies any significant pain, uses Tylenol as needed for abdominal discomfort.  Denies any issues with her incisions.  Having very minimal vaginal discharge, denies any vaginal bleeding.  Endorses improving bowel function with the use of Senokot.  Denies any  bladder symptoms.  Endorses good appetite without nausea or emesis.  Walking around frequently.  Past Medical/Surgical History: Past Medical History:  Diagnosis Date   Acanthosis nigricans    Allergic rhinitis    Asthma    in past, no issues her 23s   Colonic polyp    Hyperplastic, no adenomatous or malignant changes.    Elevated LFTs    Resolved- etiology unclear hep B, Hep C serology neg 2005   Endometrial polyp    benign 12/06   Gastric ulcer 02/20/2013   Gastric ulcer with hemorrhage 02/20/2013   GERD (gastroesophageal reflux disease)    Hx of abnormal cervical Pap smear    1980 normal in 2006.    Hyperlipidemia    Hypertension    Iron deficiency anemia    postmenopausal bleeding followed by Dr. Kalman Shan.  Hx of benign endometrial polyp.    Lumbar back pain    Obesity    morbid obesity   Pre-diabetes    Premenopausal menorrhagia    Right knee pain     Past Surgical History:  Procedure Laterality Date   COLONOSCOPY     COLONOSCOPY N/A 05/05/2013   Procedure: COLONOSCOPY;  Surgeon: Gatha Mayer, MD;  Location: [facility];  Service: Endoscopy;  Laterality: N/A;   ESOPHAGOGASTRODUODENOSCOPY N/A 02/20/2013   Procedure: ESOPHAGOGASTRODUODENOSCOPY (EGD);  Surgeon: Milus Banister, MD;  Location: [place];  Service: Endoscopy;  Laterality: N/A;   ESOPHAGOGASTRODUODENOSCOPY N/A 05/05/2013   Procedure: ESOPHAGOGASTRODUODENOSCOPY (EGD);  Surgeon: Gatha Mayer, MD;  Location: [facility];  Service: Endoscopy;  Laterality: N/A;   ROBOTIC ASSISTED TOTAL HYSTERECTOMY WITH BILATERAL SALPINGO OOPHERECTOMY Bilateral 07/10/2021   Procedure: XI ROBOTIC ASSISTED TOTAL HYSTERECTOMY WITH BILATERAL SALPINGO OOPHORECTOMY;  Surgeon:  [name], MD;  Location: [facility];  Service: Gynecology;  Laterality: Bilateral;   SENTINEL NODE BIOPSY N/A 07/10/2021   Procedure: SENTINEL NODE BIOPSY;  Surgeon: [name], MD;  Location: [facility];  Service: Gynecology;  Laterality: N/A;    THERAPEUTIC ABORTION  1975    Family History  Problem Relation Age of Onset   Hypertension Mother    Breast cancer Mother    Alcohol abuse Father    Hypertension Father    Heart disease Father    Gout Father    Breast cancer Sister    Hypertension Sister    Hypertension Sister    Colon cancer Neg Hx    Ovarian cancer Neg Hx    Endometrial cancer Neg Hx    Pancreatic cancer Neg Hx    Prostate cancer Neg Hx     Social History   Socioeconomic History   Marital status: Single    Spouse name: Not on file   Number of children: 2   Years of education: Not on file   Highest education level: Not on file  Occupational History   Occupation: paralegal   Occupation: Fish farm manager: [employer]   Occupation: retired  Tobacco Use   Smoking status: Former    Years: 10.00    Types: Cigarettes    Quit date: 07/18/1981    Years since quitting: 40.0   Smokeless tobacco: Never  Vaping Use   Vaping Use: Never used  Substance and Sexual Activity   Alcohol use: No    Alcohol/week: 0.0 standard drinks   Drug use: No   Sexual activity: Not Currently    Birth control/protection: Post-menopausal  Other Topics Concern   Not on file  Social History Narrative    The patient is a Radio broadcast assistant, she is divorced and widowed , 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   Unemployed as of summer 2014, job eliminated   1 son one daughter   No caffeine   Updated as of 03/30/2013   Social Determinants of Health   Financial Resource Strain: Not on file  Food Insecurity: Not on file  Transportation Needs: Not on file  Physical Activity: Not on file  Stress: Not on file  Social Connections: Not on file    Current Medications:  Current Outpatient Medications:    diclofenac Sodium (VOLTAREN) 1 % GEL, Apply 2 g topically daily as needed (arthritis pain)., Disp: , Rfl:    hydrochlorothiazide (HYDRODIURIL) 25 MG tablet, TAKE 1 TABLET BY MOUTH DAILY., Disp: 90  tablet, Rfl: 3   Multiple Vitamins-Minerals (ALIVE WOMENS 50+ GUMMY) CHEW, Chew 2 tablets by mouth daily., Disp: , Rfl:    Polyethyl Glycol-Propyl Glycol (SYSTANE OP), Place 1 drop into both eyes 2 (two) times daily as needed (dry eyes)., Disp: , Rfl:    rosuvastatin (CRESTOR) 20 MG tablet, TAKE ONE TABLET BY MOUTH DAILY, Disp: 90 tablet, Rfl: 1   senna-docusate (SENOKOT-S) 8.6-50 MG tablet, Take 2 tablets by mouth at bedtime. For AFTER surgery, do not take if having diarrhea, Disp: 30 tablet, Rfl: 0   traMADol (ULTRAM) 50 MG tablet, Take 1 tablet (50 mg total) by mouth every 6 (six) hours as needed for severe pain. For AFTER surgery only, do not take and drive, Disp: 10 tablet, Rfl: 0  Review of Symptoms: Pertinent positives as per HPI.  Physical Exam: There were no vitals taken for this visit. Deferred given limitations of phone visit.  Laboratory &  Radiologic Studies: None new  Assessment & Plan: [name] is a 71 y.o. woman with Stage IA, grade 1 endometrioid endometrial adenocarcinoma who presents for phone follow-up, treatment discussion.  Patient is overall doing well since surgery and appears to be meeting milestones.  Discussed continued restrictions and expectations.  Discussed the pathology from surgery with the patient and her daughter.  Patient is very happy to hear this news.  Discussed early stage low risk disease and that no adjuvant treatment is necessary.  We will review in more detail surveillance plan at her follow-up visit in person.  All questions answered.  I discussed the assessment and treatment plan with the patient. The patient was provided with an opportunity to ask questions and all were answered. The patient agreed with the plan and demonstrated an understanding of the instructions.   The patient was advised to call back or see an in-person evaluation if the symptoms worsen or if the condition fails to improve as anticipated.   16 minutes of total  time was spent for this patient encounter, including preparation, phone counseling with the patient and coordination of care, and documentation of the encounter.   Jeral Pinch, MD  Division of Gynecologic Oncology  Department of Obstetrics and Gynecology  [facility]

## 2021-07-30 ENCOUNTER — Inpatient Hospital Stay (HOSPITAL_BASED_OUTPATIENT_CLINIC_OR_DEPARTMENT_OTHER): Payer: Medicare Other | Admitting: Gynecologic Oncology

## 2021-07-30 ENCOUNTER — Encounter: Payer: Self-pay | Admitting: Gynecologic Oncology

## 2021-07-30 ENCOUNTER — Other Ambulatory Visit: Payer: Self-pay

## 2021-07-30 VITALS — BP 146/75 | HR 102 | Temp 98.4°F | Resp 16 | Ht 65.12 in | Wt 283.0 lb

## 2021-07-30 DIAGNOSIS — C541 Malignant neoplasm of endometrium: Secondary | ICD-10-CM

## 2021-07-30 DIAGNOSIS — Z90710 Acquired absence of both cervix and uterus: Secondary | ICD-10-CM

## 2021-07-30 DIAGNOSIS — Z90722 Acquired absence of ovaries, bilateral: Secondary | ICD-10-CM

## 2021-07-30 DIAGNOSIS — Z7189 Other specified counseling: Secondary | ICD-10-CM

## 2021-07-30 NOTE — Progress Notes (Signed)
Gynecologic Oncology Return Clinic Visit  07/30/2021  Reason for Visit: Follow-up after surgery, treatment discussion  Treatment History: Oncology History  Endometrial cancer, grade I ([place])  05/30/2021 Initial Biopsy   Endometrial biopsy shows FIGO grade 1 endometrioid adenocarcinoma   06/06/2021 Initial Diagnosis   Endometrial cancer, grade I ([place])   07/10/2021 Surgery   TRH/BSO, bilateral SLN bx, reduction of umbilical hernia  Findings:  On EUA, 10cm mobile uterus. On intra-abdominal entry, normal upper abdominal survey. Small amount of omentum within the umbilical hernia, easily reduced. Normal small and large bowel. Normal omentum. 12 cm bulbous uterus. Normal bilateral adnexa. Mapping successful to right common and left external iliac SLNs. No intra-abdominal or pelvic evidence of disease.    07/10/2021 Pathology Results   Stage IA, grade 1 endometrioid adenocarcinoma MI: 25% No LVI SLNs negative     Interval History: Patient presents today for postoperative follow-up.  Notes overall doing well.  Denies any significant abdominal or pelvic pain.  Has had some minimal pink spotting since surgery which has stopped.  Appetite is somewhat decreased and she is eating mostly liquids following postoperative instructions for the day of surgery to eat a light diet.  Denies any nausea or emesis.  Reports improving bowel function.  Denies any urinary symptoms.  Past Medical/Surgical History: Past Medical History:  Diagnosis Date   Acanthosis nigricans    Allergic rhinitis    Asthma    in past, no issues her 54s   Colonic polyp    Hyperplastic, no adenomatous or malignant changes.    Elevated LFTs    Resolved- etiology unclear hep B, Hep C serology neg 2005   Endometrial polyp    benign 12/06   Gastric ulcer 02/20/2013   Gastric ulcer with hemorrhage 02/20/2013   GERD (gastroesophageal reflux disease)    Hx of abnormal cervical Pap smear    1980 normal in 2006.    Hyperlipidemia     Hypertension    Iron deficiency anemia    postmenopausal bleeding followed by Dr. Kalman Shan.  Hx of benign endometrial polyp.    Lumbar back pain    Obesity    morbid obesity   Pre-diabetes    Premenopausal menorrhagia    Right knee pain     Past Surgical History:  Procedure Laterality Date   COLONOSCOPY     COLONOSCOPY N/A 05/05/2013   Procedure: COLONOSCOPY;  Surgeon: Gatha Mayer, MD;  Location: [facility];  Service: Endoscopy;  Laterality: N/A;   ESOPHAGOGASTRODUODENOSCOPY N/A 02/20/2013   Procedure: ESOPHAGOGASTRODUODENOSCOPY (EGD);  Surgeon: Milus Banister, MD;  Location: [place];  Service: Endoscopy;  Laterality: N/A;   ESOPHAGOGASTRODUODENOSCOPY N/A 05/05/2013   Procedure: ESOPHAGOGASTRODUODENOSCOPY (EGD);  Surgeon: Gatha Mayer, MD;  Location: [facility];  Service: Endoscopy;  Laterality: N/A;   ROBOTIC ASSISTED TOTAL HYSTERECTOMY WITH BILATERAL SALPINGO OOPHERECTOMY Bilateral 07/10/2021   Procedure: XI ROBOTIC ASSISTED TOTAL HYSTERECTOMY WITH BILATERAL SALPINGO OOPHORECTOMY;  Surgeon: [name], MD;  Location: [facility];  Service: Gynecology;  Laterality: Bilateral;   SENTINEL NODE BIOPSY N/A 07/10/2021   Procedure: SENTINEL NODE BIOPSY;  Surgeon: [name], MD;  [facility];  Service: Gynecology;  Laterality: N/A;   THERAPEUTIC ABORTION  1975    Family History  Problem Relation Age of Onset   Hypertension Mother    Breast cancer Mother    Alcohol abuse Father    Hypertension Father    Heart disease Father    Gout Father    Breast  cancer Sister    Hypertension Sister    Hypertension Sister    Colon cancer Neg Hx    Ovarian cancer Neg Hx    Endometrial cancer Neg Hx    Pancreatic cancer Neg Hx    Prostate cancer Neg Hx     Social History   Socioeconomic History   Marital status: Single    Spouse name: Not on file   Number of children: 2   Years of education: Not on file   Highest education level: Not on file  Occupational History    Occupation: paralegal   Occupation: SECRETARY/RECEPTIONIST    Employer: [employer], [place]   Occupation: retired  Tobacco Use   Smoking status: Former    Years: 10.00    Types: Cigarettes    Quit date: 07/18/1981    Years since quitting: 40.0   Smokeless tobacco: Never  Vaping Use   Vaping Use: Never used  Substance and Sexual Activity   Alcohol use: No    Alcohol/week: 0.0 standard drinks   Drug use: No   Sexual activity: Not Currently    Birth control/protection: Post-menopausal  Other Topics Concern   Not on file  Social History Narrative    The patient is a Radio broadcast assistant, she is divorced and widowed , 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   Unemployed as of summer 2014, job eliminated   1 son one daughter   No caffeine   Updated as of 03/30/2013   Social Determinants of Health   Financial Resource Strain: Not on file  Food Insecurity: Not on file  Transportation Needs: Not on file  Physical Activity: Not on file  Stress: Not on file  Social Connections: Not on file    Current Medications:  Current Outpatient Medications:    hydrochlorothiazide (HYDRODIURIL) 25 MG tablet, TAKE 1 TABLET BY MOUTH DAILY., Disp: 90 tablet, Rfl: 3   Multiple Vitamins-Minerals (ALIVE WOMENS 50+ GUMMY) CHEW, Chew 2 tablets by mouth daily., Disp: , Rfl:    Polyethyl Glycol-Propyl Glycol (SYSTANE OP), Place 1 drop into both eyes 2 (two) times daily as needed (dry eyes)., Disp: , Rfl:    rosuvastatin (CRESTOR) 20 MG tablet, TAKE ONE TABLET BY MOUTH DAILY, Disp: 90 tablet, Rfl: 1   diclofenac Sodium (VOLTAREN) 1 % GEL, Apply 2 g topically daily as needed (arthritis pain). (Patient not taking: Reported on 07/30/2021), Disp: , Rfl:    senna-docusate (SENOKOT-S) 8.6-50 MG tablet, Take 2 tablets by mouth at bedtime. For AFTER surgery, do not take if having diarrhea (Patient not taking: Reported on 07/30/2021), Disp: 30 tablet, Rfl: 0   traMADol (ULTRAM) 50 MG tablet, Take 1 tablet (50 mg  total) by mouth every 6 (six) hours as needed for severe pain. For AFTER surgery only, do not take and drive (Patient not taking: Reported on 07/30/2021), Disp: 10 tablet, Rfl: 0  Review of Systems: Denies appetite changes, fevers, chills, fatigue, unexplained weight changes. Denies hearing loss, neck lumps or masses, mouth sores, ringing in ears or voice changes. Denies cough or wheezing.  Denies shortness of breath. Denies chest pain or palpitations. Denies leg swelling. Denies abdominal distention, pain, blood in stools, constipation, diarrhea, nausea, vomiting, or early satiety. Denies pain with intercourse, dysuria, frequency, hematuria or incontinence. Denies hot flashes, pelvic pain, or vaginal discharge.   Denies joint pain, back pain or muscle pain/cramps. Denies itching, rash, or wounds. Denies dizziness, headaches, numbness or seizures. Denies swollen lymph nodes or glands, denies easy bruising or  bleeding. Denies anxiety, depression, confusion, or decreased concentration.  Physical Exam: BP (!) 146/75 (BP Location: Left Arm, Patient Position: Sitting)    Pulse (!) 102    Temp 98.4 F (36.9 C) (Oral)    Resp 16    Ht 5' 5.12" (1.654 m)    Wt 283 lb (128.4 kg)    SpO2 100%    BMI 46.92 kg/m  General: Alert, oriented, no acute distress. HEENT: Normocephalic, atraumatic, sclera anicteric. Chest: Clear to auscultation bilaterally.  No wheezes or rhonchi. Cardiovascular: Regular rate and rhythm, no murmurs. Abdomen: Obese, soft, nontender.  Normoactive bowel sounds.  No masses or hepatosplenomegaly appreciated.  Well-healing incisions. Extremities: Grossly normal range of motion.  Warm, well perfused.  No edema bilaterally. Skin: No rashes or lesions noted. GU: Normal appearing external genitalia without erythema, excoriation, or lesions.  Speculum exam reveals no bleeding or discharge, cuff intact, suture visible.  Bimanual exam reveals Intact, no fluctuance or tenderness.     Laboratory & Radiologic Studies: MMR IHC intact MSI pending  Assessment & Plan: [name] is a 71 y.o. woman with Stage IA grade 1 endometrioid endometrial adenocarcinoma who presents for follow-up after surgery. MMR IHC intact.  MSI pending.  Patient is overall doing well from a postoperative standpoint.  We discussed continued activity restrictions as well as postoperative limitations.  Patient was given a copy of her pathology report we reviewed this in detail again together today.  Given low risk early stage uterine cancer, no adjuvant treatment is recommended.  We reviewed signs and symptoms that would be concerning for disease recurrence and I stressed the importance of calling if she develops any of these.  Per NCCN surveillance recommendations, we discussed need for surveillance visits every 6 months.    28 minutes of total time was spent for this patient encounter, including preparation, face-to-face counseling with the patient and coordination of care, and documentation of the encounter.  Jeral Pinch, MD  Division of Gynecologic Oncology  Department of Obstetrics and Gynecology  [facility]

## 2021-07-30 NOTE — Patient Instructions (Signed)
It was good to see you today.  You are healing well from surgery!  Remember, no lifting more than 10 pounds until 6 weeks after surgery and nothing in the vagina for at least 8 weeks.  We will continue with follow-up visits every 6 months for 5 years.  In between visits, if you have any concerning symptoms, such as vaginal bleeding, discharge, pelvic pain, change to bowel function, or unintentional weight loss, please call me sooner to come in for a visit.  Because my schedule is not out past the end of June, please call back in June or July to get a visit scheduled with me in late August.

## 2021-08-09 ENCOUNTER — Encounter: Payer: Self-pay | Admitting: *Deleted

## 2021-08-31 ENCOUNTER — Other Ambulatory Visit: Payer: Self-pay

## 2021-08-31 DIAGNOSIS — I10 Essential (primary) hypertension: Secondary | ICD-10-CM

## 2021-09-04 ENCOUNTER — Ambulatory Visit (INDEPENDENT_AMBULATORY_CARE_PROVIDER_SITE_OTHER): Payer: Medicare Other | Admitting: Internal Medicine

## 2021-09-04 ENCOUNTER — Other Ambulatory Visit: Payer: Self-pay

## 2021-09-04 ENCOUNTER — Encounter: Payer: Self-pay | Admitting: Internal Medicine

## 2021-09-04 VITALS — BP 154/65 | HR 66 | Wt 286.4 lb

## 2021-09-04 DIAGNOSIS — I10 Essential (primary) hypertension: Secondary | ICD-10-CM | POA: Diagnosis not present

## 2021-09-04 DIAGNOSIS — E7800 Pure hypercholesterolemia, unspecified: Secondary | ICD-10-CM

## 2021-09-04 MED ORDER — OLMESARTAN-AMLODIPINE-HCTZ 20-5-12.5 MG PO TABS: 1.0000 | ORAL_TABLET | Freq: Every day | ORAL | 1 refills | Status: DC

## 2021-09-04 MED ORDER — ROSUVASTATIN CALCIUM 20 MG PO TABS: 20.0000 mg | ORAL_TABLET | Freq: Every day | ORAL | 1 refills | Status: DC

## 2021-09-04 NOTE — Patient Instructions (Signed)
Thank you for trusting me with your care. To recap, today we discussed the following: ? ? ?1. Pure hypercholesterolemia ?- Refilled ?- rosuvastatin (CRESTOR) 20 MG tablet; Take 1 tablet (20 mg total) by mouth daily.  Dispense: 90 tablet; Refill: 1 ? ?2. Essential hypertension ?- Olmesartan-amLODIPine-HCTZ (TRIBENZOR) 20-5-12.5 MG TABS; Take 1 tablet by mouth daily.  Dispense: 30 tablet; Refill: 1 ? ?-Follow up in 2 weeks for blood work and blood pressure check ? ? ?

## 2021-09-04 NOTE — Assessment & Plan Note (Signed)
?  Patient's BP today is 156/75 with a goal of <130/80. The patient endorses adherence to her medication regimen of HCTZ '25mg'$  daily.  ? ?HTN, uncontrolled. Will stop HCTZ. Start combination pill today and have follow up this month. May need further titration.  ?- Olmesartan-amLODIPine-HCTZ (TRIBENZOR) 20-5-12.5 MG TABS; Take 1 tablet by mouth daily.  Dispense: 30 tablet; Refill: 1 ? ?

## 2021-09-04 NOTE — Progress Notes (Signed)
? ?  CC: high cholesterol and high blood pressure ? ?HPI:[name] is a 71 y.o. female who presents for evaluation of hyperlipidemia and hypertension. Please see individual problem based A/P for details. ? ? ?Past Medical History:  ?Diagnosis Date  ? Acanthosis nigricans   ? Allergic rhinitis   ? Asthma   ? in past, no issues her 65s  ? Colonic polyp   ? Hyperplastic, no adenomatous or malignant changes.   ? Elevated LFTs   ? Resolved- etiology unclear hep B, Hep C serology neg 2005  ? Endometrial polyp   ? benign 12/06  ? Gastric ulcer 02/20/2013  ? Gastric ulcer with hemorrhage 02/20/2013  ? GERD (gastroesophageal reflux disease)   ? Hx of abnormal cervical Pap smear   ? 1980 normal in 2006.   ? Hyperlipidemia   ? Hypertension   ? Iron deficiency anemia   ? postmenopausal bleeding followed by Dr. Kalman Shan.  Hx of benign endometrial polyp.   ? Lumbar back pain   ? Obesity   ? morbid obesity  ? Pre-diabetes   ? Premenopausal menorrhagia   ? Right knee pain   ? ?Review of Systems:   ?Review of Systems  ?Constitutional:  Negative for chills and fever.  ?Musculoskeletal:  Negative for falls and myalgias.   ? ?Physical Exam: ?Vitals:  ? 09/04/21 1047  ?BP: (!) 158/69  ?Pulse: 73  ?SpO2: 100%  ?Weight: 286 lb 6.4 oz (129.9 kg)  ? ?Physical Exam ?Constitutional:   ?   Appearance: Normal appearance. She is well-developed. She is morbidly obese.  ?Cardiovascular:  ?   Rate and Rhythm: Normal rate and regular rhythm.  ?   Heart sounds: Normal heart sounds.  ?Pulmonary:  ?   Breath sounds: Normal breath sounds. No wheezing.  ?Abdominal:  ?   General: Bowel sounds are normal.  ?   Tenderness: There is no abdominal tenderness.  ?Musculoskeletal:  ?   Right lower leg: No edema.  ?   Left lower leg: No edema.  ? ? ?Assessment & Plan:  ? ?See Encounters Tab for problem based charting. ? ?Patient discussed with Dr. Jimmye Norman ? ?

## 2021-09-04 NOTE — Assessment & Plan Note (Signed)
Refill medication ?- rosuvastatin (CRESTOR) 20 MG tablet; Take 1 tablet (20 mg total) by mouth daily.  Dispense: 90 tablet; Refill: 1 ? ? ? ?

## 2021-09-05 NOTE — Progress Notes (Signed)
Internal Medicine Clinic Attending  Case discussed with Dr. Steen  At the time of the visit.  We reviewed the resident's history and exam and pertinent patient test results.  I agree with the assessment, diagnosis, and plan of care documented in the resident's note.  

## 2021-09-28 ENCOUNTER — Encounter: Payer: Self-pay | Admitting: Internal Medicine

## 2021-09-28 ENCOUNTER — Ambulatory Visit (INDEPENDENT_AMBULATORY_CARE_PROVIDER_SITE_OTHER): Payer: Medicare Other | Admitting: Internal Medicine

## 2021-09-28 VITALS — BP 140/48 | HR 64 | Wt 287.9 lb

## 2021-09-28 DIAGNOSIS — I10 Essential (primary) hypertension: Secondary | ICD-10-CM

## 2021-09-28 MED ORDER — OLMESARTAN-AMLODIPINE-HCTZ 40-10-12.5 MG PO TABS: 1.0000 | ORAL_TABLET | Freq: Every day | ORAL | 1 refills | Status: DC

## 2021-09-28 NOTE — Progress Notes (Addendum)
? ? ? ?  CC: hypertension ? ?HPI:[name] is a 71 y.o. female who presents for evaluation of hypertension. Please see individual problem based A/P for details. ? ? ?Past Medical History:  ?Diagnosis Date  ? Acanthosis nigricans   ? Allergic rhinitis   ? Asthma   ? in past, no issues her 81s  ? Colonic polyp   ? Hyperplastic, no adenomatous or malignant changes.   ? Elevated LFTs   ? Resolved- etiology unclear hep B, Hep C serology neg 2005  ? Endometrial polyp   ? benign 12/06  ? Gastric ulcer 02/20/2013  ? Gastric ulcer with hemorrhage 02/20/2013  ? GERD (gastroesophageal reflux disease)   ? Hx of abnormal cervical Pap smear   ? 1980 normal in 2006.   ? Hyperlipidemia   ? Hypertension   ? Iron deficiency anemia   ? postmenopausal bleeding followed by Dr. Kalman Shan.  Hx of benign endometrial polyp.   ? Lumbar back pain   ? Obesity   ? morbid obesity  ? Pre-diabetes   ? Premenopausal menorrhagia   ? Right knee pain   ? ?Review of Systems:   ?Review of Systems  ?Cardiovascular:  Negative for chest pain and leg swelling.  ?Neurological:  Negative for dizziness, loss of consciousness and headaches.   ? ?Physical Exam: ?Vitals:  ? 09/28/21 0915 09/28/21 0936  ?BP: (!) 144/56 (!) 140/48  ?Pulse: 65 64  ?SpO2: 99%   ?Weight: 287 lb 14.4 oz (130.6 kg)   ? ? ? ?Physical Exam ?Constitutional:   ?   Appearance: Normal appearance. She is obese.  ?Cardiovascular:  ?   Rate and Rhythm: Normal rate and regular rhythm.  ?Pulmonary:  ?   Effort: Pulmonary effort is normal.  ?   Breath sounds: Normal breath sounds.  ?Skin: ?   General: Skin is warm and dry.  ?Psychiatric:     ?   Mood and Affect: Mood normal.     ?   Behavior: Behavior normal.  ? ? ? ?Assessment & Plan:  ? ?Essential hypertension ? ?The patient endorses adherence to Olmesartan-amlodipine-HCTZ 20-5-12.5 started 09/04/2021. Here for follow up. Patient's BP today is 144/56 with a goal of <130/80.  Denied lightheadedness, weakness, dizziness on standing, swelling in  the feet or ankles.  ? ?Plan: ?Increase to Olmesartan - amlodipine - HCTZ 40-10-12.5 ?-BMP ? ? ? ?Patient discussed with Dr. Jimmye Norman ? ?

## 2021-09-28 NOTE — Assessment & Plan Note (Signed)
?  The patient endorses adherence to Olmesartan-amlodipine-HCTZ 20-5-12.5 started 09/04/2021. Here for follow up. Patient's BP today is 144/56 with a goal of <130/80.  Denied lightheadedness, weakness, dizziness on standing, swelling in the feet or ankles.  ? ?Plan: ?Increase to Olmesartan - amlodipine - HCTZ 40-10-12.5 ?-BMP ?

## 2021-09-28 NOTE — Patient Instructions (Signed)
Thank you for trusting me with your care. To recap, today we discussed the following: ? ? ?BP Readings from Last 3 Encounters:  ?09/28/21 (!) 140/48  ?09/04/21 (!) 154/65  ?07/30/21 (!) 146/75  ? ?We increased dose of olmesartan and amlodipine in combination medication today. ? ?- Olmesartan-amLODIPine-HCTZ (TRIBENZOR) 40-10-12.5 MG TABS; Take 1 tablet by mouth daily.  Dispense: 90 tablet; Refill: 1 ?- BMP8+Anion Gap ? ? ?

## 2021-09-29 LAB — BMP8+ANION GAP
Anion Gap: 15 mmol/L (ref 10.0–18.0)
BUN/Creatinine Ratio: 16 (ref 12–28)
BUN: 14 mg/dL (ref 8–27)
CO2: 23 mmol/L (ref 20–29)
Calcium: 8.9 mg/dL (ref 8.7–10.3)
Chloride: 102 mmol/L (ref 96–106)
Creatinine, Ser: 0.9 mg/dL (ref 0.57–1.00)
Glucose: 101 mg/dL — ABNORMAL HIGH (ref 70–99)
Potassium: 3.9 mmol/L (ref 3.5–5.2)
Sodium: 140 mmol/L (ref 134–144)
eGFR: 70 mL/min/{1.73_m2} (ref >59)

## 2021-10-04 NOTE — Progress Notes (Signed)
Internal Medicine Clinic Attending  Case discussed with Dr. Steen  At the time of the visit.  We reviewed the resident's history and exam and pertinent patient test results.  I agree with the assessment, diagnosis, and plan of care documented in the resident's note.  

## 2021-10-11 ENCOUNTER — Other Ambulatory Visit: Payer: Self-pay | Admitting: Internal Medicine

## 2021-10-11 DIAGNOSIS — Z1231 Encounter for screening mammogram for malignant neoplasm of breast: Secondary | ICD-10-CM

## 2021-11-01 ENCOUNTER — Ambulatory Visit
Admission: RE | Admit: 2021-11-01 | Discharge: 2021-11-01 | Disposition: A | Discharge status: Home / Self Care | Payer: Medicare Other | Source: Ambulatory Visit | Attending: *Deleted | Admitting: *Deleted

## 2021-11-01 DIAGNOSIS — Z1231 Encounter for screening mammogram for malignant neoplasm of breast: Secondary | ICD-10-CM

## 2021-11-05 ENCOUNTER — Other Ambulatory Visit: Payer: Self-pay | Admitting: *Deleted

## 2021-11-05 ENCOUNTER — Other Ambulatory Visit: Payer: Self-pay | Admitting: Internal Medicine

## 2021-11-05 DIAGNOSIS — R928 Other abnormal and inconclusive findings on diagnostic imaging of breast: Secondary | ICD-10-CM

## 2021-11-12 ENCOUNTER — Ambulatory Visit
Admission: RE | Admit: 2021-11-12 | Discharge: 2021-11-12 | Disposition: A | Discharge status: Home / Self Care | Payer: Medicare Other | Source: Ambulatory Visit | Attending: Family Medicine | Admitting: Family Medicine

## 2021-11-12 ENCOUNTER — Ambulatory Visit
Admission: RE | Admit: 2021-11-12 | Discharge: 2021-11-12 | Disposition: A | Discharge status: Home / Self Care | Payer: Medicare Other | Source: Ambulatory Visit | Attending: *Deleted | Admitting: *Deleted

## 2021-11-12 ENCOUNTER — Other Ambulatory Visit: Payer: Self-pay | Admitting: Internal Medicine

## 2021-11-12 DIAGNOSIS — N6489 Other specified disorders of breast: Secondary | ICD-10-CM

## 2021-11-12 DIAGNOSIS — R928 Other abnormal and inconclusive findings on diagnostic imaging of breast: Secondary | ICD-10-CM

## 2021-11-12 DIAGNOSIS — N641 Fat necrosis of breast: Secondary | ICD-10-CM | POA: Diagnosis not present

## 2021-11-12 DIAGNOSIS — Z803 Family history of malignant neoplasm of breast: Secondary | ICD-10-CM | POA: Diagnosis not present

## 2021-11-15 ENCOUNTER — Encounter: Payer: Self-pay | Admitting: Internal Medicine

## 2021-11-15 DIAGNOSIS — N6489 Other specified disorders of breast: Secondary | ICD-10-CM | POA: Insufficient documentation

## 2021-12-03 ENCOUNTER — Telehealth: Payer: Self-pay | Admitting: *Deleted

## 2021-12-03 NOTE — Telephone Encounter (Signed)
Patient called and scheduled a follow up appt on 8/24

## 2022-01-22 ENCOUNTER — Encounter: Payer: Self-pay | Admitting: Gynecologic Oncology

## 2022-01-24 ENCOUNTER — Inpatient Hospital Stay: Payer: Medicare Other | Attending: Gynecologic Oncology | Admitting: Gynecologic Oncology

## 2022-01-24 VITALS — BP 120/54 | HR 82 | Temp 98.8°F | Resp 16 | Ht 65.0 in | Wt 297.7 lb

## 2022-01-24 DIAGNOSIS — Z90722 Acquired absence of ovaries, bilateral: Secondary | ICD-10-CM | POA: Insufficient documentation

## 2022-01-24 DIAGNOSIS — Z90710 Acquired absence of both cervix and uterus: Secondary | ICD-10-CM | POA: Insufficient documentation

## 2022-01-24 DIAGNOSIS — C541 Malignant neoplasm of endometrium: Secondary | ICD-10-CM

## 2022-01-24 DIAGNOSIS — Z8542 Personal history of malignant neoplasm of other parts of uterus: Secondary | ICD-10-CM | POA: Diagnosis present

## 2022-01-24 NOTE — Progress Notes (Signed)
Gynecologic Oncology Return Clinic Visit  01/24/2022  Reason for Visit: Surveillance visit in the setting of endometrial cancer  Treatment History: Oncology History  Endometrial cancer, grade I ([place])  05/30/2021 Initial Biopsy   Endometrial biopsy shows FIGO grade 1 endometrioid adenocarcinoma   06/06/2021 Initial Diagnosis   Endometrial cancer, grade I ([place])   07/10/2021 Surgery   TRH/BSO, bilateral SLN bx, reduction of umbilical hernia  Findings:  On EUA, 10cm mobile uterus. On intra-abdominal entry, normal upper abdominal survey. Small amount of omentum within the umbilical hernia, easily reduced. Normal small and large bowel. Normal omentum. 12 cm bulbous uterus. Normal bilateral adnexa. Mapping successful to right common and left external iliac SLNs. No intra-abdominal or pelvic evidence of disease.    07/10/2021 Pathology Results   Stage IA, grade 1 endometrioid adenocarcinoma MI: 25% No LVI SLNs negative     Interval History: Patient presents today for follow-up.  Overall doing well.  Denies any pelvic or abdominal pain.  Has some occasional left hip pain and right shoulder/upper back pain.  Reports regular bowel and bladder function.  Denies any vaginal bleeding or discharge.  Past Medical/Surgical History: Past Medical History:  Diagnosis Date   Acanthosis nigricans    Allergic rhinitis    Asthma    in past, no issues her 89s   Colonic polyp    Hyperplastic, no adenomatous or malignant changes.    Elevated LFTs    Resolved- etiology unclear hep B, Hep C serology neg 2005   Endometrial polyp    benign 12/06   Gastric ulcer 02/20/2013   Gastric ulcer with hemorrhage 02/20/2013   GERD (gastroesophageal reflux disease)    Hx of abnormal cervical Pap smear    1980 normal in 2006.    Hyperlipidemia    Hypertension    Iron deficiency anemia    postmenopausal bleeding followed by Dr. Kalman Shan.  Hx of benign endometrial polyp.    Lumbar back pain    Obesity    morbid  obesity   Pre-diabetes    Premenopausal menorrhagia    Right knee pain     Past Surgical History:  Procedure Laterality Date   COLONOSCOPY     COLONOSCOPY N/A 05/05/2013   Procedure: COLONOSCOPY;  Surgeon: Gatha Mayer, MD;  Location: [facility];  Service: Endoscopy;  Laterality: N/A;   ESOPHAGOGASTRODUODENOSCOPY N/A 02/20/2013   Procedure: ESOPHAGOGASTRODUODENOSCOPY (EGD);  Surgeon: Milus Banister, MD;  Location: [place];  Service: Endoscopy;  Laterality: N/A;   ESOPHAGOGASTRODUODENOSCOPY N/A 05/05/2013   Procedure: ESOPHAGOGASTRODUODENOSCOPY (EGD);  Surgeon: Gatha Mayer, MD;  Location: [facility];  Service: Endoscopy;  Laterality: N/A;   ROBOTIC ASSISTED TOTAL HYSTERECTOMY WITH BILATERAL SALPINGO OOPHERECTOMY Bilateral 07/10/2021   Procedure: XI ROBOTIC ASSISTED TOTAL HYSTERECTOMY WITH BILATERAL SALPINGO OOPHORECTOMY;  Surgeon: [name], MD;  Location: [facility];  Service: Gynecology;  Laterality: Bilateral;   SENTINEL NODE BIOPSY N/A 07/10/2021   Procedure: SENTINEL NODE BIOPSY;  Surgeon: [name], MD;  Location: [facility];  Service: Gynecology;  Laterality: N/A;   THERAPEUTIC ABORTION  1975    Family History  Problem Relation Age of Onset   Hypertension Mother    Breast cancer Mother 59   Alcohol abuse Father    Hypertension Father    Heart disease Father    Gout Father    Breast cancer Sister        40s   Hypertension Sister    Hypertension Sister    Colon cancer Neg Hx  Ovarian cancer Neg Hx    Endometrial cancer Neg Hx    Pancreatic cancer Neg Hx    Prostate cancer Neg Hx     Social History   Socioeconomic History   Marital status: Single    Spouse name: Not on file   Number of children: 2   Years of education: Not on file   Highest education level: Not on file  Occupational History   Occupation: paralegal   Occupation: SECRETARY/RECEPTIONIST    Employer: [employer], [place]   Occupation: retired  Tobacco Use    Smoking status: Former    Years: 10.00    Types: Cigarettes    Quit date: 07/18/1981    Years since quitting: 40.5   Smokeless tobacco: Never  Vaping Use   Vaping Use: Never used  Substance and Sexual Activity   Alcohol use: No    Alcohol/week: 0.0 standard drinks of alcohol   Drug use: No   Sexual activity: Not Currently    Birth control/protection: Post-menopausal  Other Topics Concern   Not on file  Social History Narrative    The patient is a Radio broadcast assistant, she is divorced and widowed , 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   Unemployed as of summer 2014, job eliminated   1 son one daughter   No caffeine   Updated as of 03/30/2013   Social Determinants of Health   Financial Resource Strain: Not on file  Food Insecurity: Not on file  Transportation Needs: Not on file  Physical Activity: Not on file  Stress: Not on file  Social Connections: Not on file    Current Medications:  Current Outpatient Medications:    Multiple Vitamins-Minerals (ALIVE WOMENS 50+ GUMMY) CHEW, Chew 2 tablets by mouth daily., Disp: , Rfl:    Olmesartan-amLODIPine-HCTZ (TRIBENZOR) 40-10-12.5 MG TABS, Take 1 tablet by mouth daily., Disp: 90 tablet, Rfl: 1   Polyethyl Glycol-Propyl Glycol (SYSTANE OP), Place 1 drop into both eyes 2 (two) times daily as needed (dry eyes)., Disp: , Rfl:    rosuvastatin (CRESTOR) 20 MG tablet, Take 1 tablet (20 mg total) by mouth daily., Disp: 90 tablet, Rfl: 1  Review of Systems: Denies appetite changes, fevers, chills, fatigue, unexplained weight changes. Denies hearing loss, neck lumps or masses, mouth sores, ringing in ears or voice changes. Denies cough or wheezing.  Denies shortness of breath. Denies chest pain or palpitations. Denies leg swelling. Denies abdominal distention, pain, blood in stools, constipation, diarrhea, nausea, vomiting, or early satiety. Denies pain with intercourse, dysuria, frequency, hematuria or incontinence. Denies hot flashes, pelvic pain,  vaginal bleeding or vaginal discharge.   Denies joint pain, back pain or muscle pain/cramps. Denies itching, rash, or wounds. Denies dizziness, headaches, numbness or seizures. Denies swollen lymph nodes or glands, denies easy bruising or bleeding. Denies anxiety, depression, confusion, or decreased concentration.  Physical Exam: BP (!) 120/54 (BP Location: Left Wrist, Patient Position: Sitting)   Pulse 82   Temp 98.8 F (37.1 C) (Oral)   Resp 16   Ht '5\' 5"'  (1.651 m)   Wt 297 lb 11.2 oz (135 kg)   SpO2 99%   BMI 49.54 kg/m  General: Alert, oriented, no acute distress. HEENT: Normocephalic, atraumatic, sclera anicteric. Chest: Clear to auscultation bilaterally.  No wheezes or rhonchi. Cardiovascular: Regular rate and rhythm, no murmurs. Abdomen: Obese, soft, nontender.  Normoactive bowel sounds.  No masses or hepatosplenomegaly appreciated.  Well-healed incisions. Extremities: Grossly normal range of motion.  Warm, well perfused.  No  edema bilaterally. Skin: No rashes or lesions noted. Lymphatics: No cervical, supraclavicular, or inguinal adenopathy. GU: Normal appearing external genitalia without erythema, excoriation, or lesions.  Speculum exam reveals well rugated vaginal mucosa.  Some redundancy of the vaginal walls.  No masses noted.  No bleeding or discharge.  Bimanual exam reveals cuff intact, no nodularity or masses.  Rectovaginal exam confirms these findings, some stool noted in the rectum.  Laboratory & Radiologic Studies: None new  Assessment & Plan: [name] is a 71 y.o. woman with Stage IA grade 1 endometrioid endometrial adenocarcinoma who presents for follow-up after surgery. MMR IHC intact.  MS-stable.  Patient is overall doing well.  NED on exam today.   Per NCCN surveillance recommendations, we discussed need for surveillance visits every 6 months.  She did not have an OB/GYN before her diagnosis.  I will plan to continue to see her every 6 months. We  reviewed signs and symptoms that would be concerning for cancer recurrence and I stressed the importance of calling if she develops any of these between visits.  22 minutes of total time was spent for this patient encounter, including preparation, face-to-face counseling with the patient and coordination of care, and documentation of the encounter.  Jeral Pinch, MD  Division of Gynecologic Oncology  Department of Obstetrics and Gynecology  [facility]

## 2022-01-24 NOTE — Patient Instructions (Addendum)
It was good to see you today.  I do not see or feel any evidence of cancer recurrence on your exam.  Please call my office sometime in December or at the beginning of next year to schedule a visit to see me in February.  If you develop any new and concerning symptoms, such as vaginal bleeding or pelvic pain, please call to see me sooner.

## 2022-02-28 ENCOUNTER — Encounter: Payer: Self-pay | Admitting: Family Medicine

## 2022-02-28 ENCOUNTER — Ambulatory Visit: Payer: Medicare Other | Admitting: *Deleted

## 2022-02-28 ENCOUNTER — Ambulatory Visit (INDEPENDENT_AMBULATORY_CARE_PROVIDER_SITE_OTHER): Payer: Medicare Other | Admitting: Family Medicine

## 2022-02-28 ENCOUNTER — Encounter: Payer: Self-pay | Admitting: *Deleted

## 2022-02-28 VITALS — BP 143/47

## 2022-02-28 VITALS — BP 141/66 | HR 71 | Temp 98.2°F | Ht 65.5 in | Wt 301.2 lb

## 2022-02-28 DIAGNOSIS — J069 Acute upper respiratory infection, unspecified: Secondary | ICD-10-CM | POA: Insufficient documentation

## 2022-02-28 DIAGNOSIS — Z23 Encounter for immunization: Secondary | ICD-10-CM

## 2022-02-28 DIAGNOSIS — R7303 Prediabetes: Secondary | ICD-10-CM

## 2022-02-28 DIAGNOSIS — E785 Hyperlipidemia, unspecified: Secondary | ICD-10-CM

## 2022-02-28 DIAGNOSIS — I10 Essential (primary) hypertension: Secondary | ICD-10-CM | POA: Diagnosis not present

## 2022-02-28 DIAGNOSIS — Z87891 Personal history of nicotine dependence: Secondary | ICD-10-CM

## 2022-02-28 DIAGNOSIS — Z0000 Encounter for general adult medical examination without abnormal findings: Secondary | ICD-10-CM

## 2022-02-28 NOTE — Patient Instructions (Addendum)
We will keep you on the same dosage of the blood pressure medication as it is adequately controlling your blood pressure. Continue to take your medications as prescribed Keep monitoring your blood pressure at home. You got your flu vaccine today. Follow-up appointment in 6 months

## 2022-02-28 NOTE — Assessment & Plan Note (Signed)
Her hemoglobin A1c in January 2023 was 6.2 which was slightly better than her previous reading of 6.3. Patient encouraged to engage in exercise as tolerated and consume low-fat diet. We will continue to monitor her hemoglobin A1c.

## 2022-02-28 NOTE — Assessment & Plan Note (Signed)
Patient has nasal congestion, clear nasal drainage, postnasal drip and mild dry, nonproductive cough. She was exposed to sick great-grandson last week Saturday.  Patient remains afebrile.  She denies chest pain or shortness of breath.  Her lungs were clear to auscultation.  Clinical presentation and physical exam consistent with viral URI.  There is no concerns for upper or lower respiratory tract bacterial infection.  Patient was encouraged to use start Mucinex and Flonase to help with cough and nasal congestion.

## 2022-02-28 NOTE — Assessment & Plan Note (Signed)
Lipid profile in January 2023 was within normal limits. Patient will continue rosuvastatin 20 mg once daily.

## 2022-02-28 NOTE — Progress Notes (Signed)
   CC: Follow-up for hypertension.  HPI: [name] is a 71 y.o. female with history of hypertension, hyperlipidemia, prediabetes presented to clinic for hypertension evaluation and management.  Patient states she was exposed to her sick great grandson last week Saturday. She is c/o nasal congestion, runny nose, mild cough.  She denies fever, chills, chest pain or shortness of breath.   Past Medical History:  Diagnosis Date   Acanthosis nigricans    Allergic rhinitis    Asthma    in past, no issues her 19s   Colonic polyp    Hyperplastic, no adenomatous or malignant changes.    Elevated LFTs    Resolved- etiology unclear hep B, Hep C serology neg 2005   Endometrial polyp    benign 12/06   Gastric ulcer 02/20/2013   Gastric ulcer with hemorrhage 02/20/2013   GERD (gastroesophageal reflux disease)    Hx of abnormal cervical Pap smear    1980 normal in 2006.    Hyperlipidemia    Hypertension    Iron deficiency anemia    postmenopausal bleeding followed by Dr. Kalman Shan.  Hx of benign endometrial polyp.    Lumbar back pain    Obesity    morbid obesity   Pre-diabetes    Premenopausal menorrhagia    Right knee pain    Review of Systems:  Review of Systems  Constitutional:  Negative for chills and fever.  HENT:  Positive for congestion.   Respiratory:  Positive for cough.   Skin:  Negative for rash.     Physical Exam: Physical Exam Vitals and nursing note reviewed.  Constitutional:      Appearance: Normal appearance. She is obese.  HENT:     Head: Normocephalic and atraumatic.     Nose: Congestion and rhinorrhea present.     Mouth/Throat:     Mouth: Mucous membranes are moist.     Pharynx: Posterior oropharyngeal erythema (Mild erythema without lesions or exudates) present.  Eyes:     Conjunctiva/sclera: Conjunctivae normal.  Cardiovascular:     Rate and Rhythm: Normal rate and regular rhythm.  Pulmonary:     Effort: Pulmonary effort is normal.     Breath  sounds: Normal breath sounds. No wheezing, rhonchi or rales.  Musculoskeletal:     Cervical back: Neck supple.  Skin:    General: Skin is warm.     Findings: No rash.  Neurological:     Mental Status: She is alert.  Psychiatric:        Mood and Affect: Mood normal.      Vitals:   02/28/22 1105  BP: (!) 141/66  Pulse: 71  Temp: 98.2 F (36.8 C)  TempSrc: Oral  SpO2: 98%  Weight: (!) 301 lb 3.2 oz (136.6 kg)  Height: 5' 5.5" (1.664 m)     Assessment & Plan:   See Encounters Tab for problem based charting.  Patient seen with Dr. Evette Doffing

## 2022-02-28 NOTE — Progress Notes (Unsigned)
Subjective:   [name] is a 71 y.o. female who presents for an Initial Medicare Annual Wellness Visit.  Review of Systems    Defer to pcp       Objective:    There were no vitals filed for this visit. There is no height or weight on file to calculate BMI.     09/28/2021    9:14 AM 07/30/2021   11:44 AM 07/03/2021    8:30 AM 06/26/2021    9:51 AM 06/25/2021   10:37 AM 02/20/2021    1:34 PM 08/30/2020   10:12 AM  Advanced Directives  Does Patient Have a Medical Advance Directive? No No No No No Yes No  Type of Advance Directive      [place]   Does patient want to make changes to medical advance directive?  No - Patient declined Yes (MAU/Ambulatory/Procedural Areas - Information given) Yes (MAU/Ambulatory/Procedural Areas - Information given)  No - Patient declined   Copy of [place] in Chart?      No - copy requested   Would patient like information on creating a medical advance directive? No - Patient declined No - Patient declined Yes (MAU/Ambulatory/Procedural Areas - Information given)  No - Patient declined  No - Patient declined    Current Medications (verified) Outpatient Encounter Medications as of 02/28/2022  Medication Sig   Multiple Vitamins-Minerals (ALIVE WOMENS 50+ GUMMY) CHEW Chew 2 tablets by mouth daily.   Olmesartan-amLODIPine-HCTZ (TRIBENZOR) 40-10-12.5 MG TABS Take 1 tablet by mouth daily.   Polyethyl Glycol-Propyl Glycol (SYSTANE OP) Place 1 drop into both eyes 2 (two) times daily as needed (dry eyes).   rosuvastatin (CRESTOR) 20 MG tablet Take 1 tablet (20 mg total) by mouth daily.   No facility-administered encounter medications on file as of 02/28/2022.    Allergies (verified) Patient has no known allergies.   History: Past Medical History:  Diagnosis Date   Acanthosis nigricans    Allergic rhinitis    Asthma    in past, no issues her 23s   Colonic polyp    Hyperplastic, no adenomatous or malignant  changes.    Elevated LFTs    Resolved- etiology unclear hep B, Hep C serology neg 2005   Endometrial polyp    benign 12/06   Gastric ulcer 02/20/2013   Gastric ulcer with hemorrhage 02/20/2013   GERD (gastroesophageal reflux disease)    Hx of abnormal cervical Pap smear    1980 normal in 2006.    Hyperlipidemia    Hypertension    Iron deficiency anemia    postmenopausal bleeding followed by Dr. Kalman Shan.  Hx of benign endometrial polyp.    Lumbar back pain    Obesity    morbid obesity   Pre-diabetes    Premenopausal menorrhagia    Right knee pain    Past Surgical History:  Procedure Laterality Date   COLONOSCOPY     COLONOSCOPY N/A 05/05/2013   Procedure: COLONOSCOPY;  Surgeon: Gatha Mayer, MD;  Location: [facility];  Service: Endoscopy;  Laterality: N/A;   ESOPHAGOGASTRODUODENOSCOPY N/A 02/20/2013   Procedure: ESOPHAGOGASTRODUODENOSCOPY (EGD);  Surgeon: Milus Banister, MD;  Location: [facility];  Service: Endoscopy;  Laterality: N/A;   ESOPHAGOGASTRODUODENOSCOPY N/A 05/05/2013   Procedure: ESOPHAGOGASTRODUODENOSCOPY (EGD);  Surgeon: Gatha Mayer, MD;  Location: [facility];  Service: Endoscopy;  Laterality: N/A;   ROBOTIC ASSISTED TOTAL HYSTERECTOMY WITH BILATERAL SALPINGO OOPHERECTOMY Bilateral 07/10/2021   Procedure: XI ROBOTIC ASSISTED TOTAL  HYSTERECTOMY WITH BILATERAL SALPINGO OOPHORECTOMY;  Surgeon: [name], MD;  Location: [facility];  Service: Gynecology;  Laterality: Bilateral;   SENTINEL NODE BIOPSY N/A 07/10/2021   Procedure: SENTINEL NODE BIOPSY;  Surgeon: [name], MD;  Location: [facility];  Service: Gynecology;  Laterality: N/A;   THERAPEUTIC ABORTION  1975   Family History  Problem Relation Age of Onset   Hypertension Mother    Breast cancer Mother 67   Alcohol abuse Father    Hypertension Father    Heart disease Father    Gout Father    Breast cancer Sister        16s   Hypertension Sister    Hypertension Sister    Colon cancer Neg Hx     Ovarian cancer Neg Hx    Endometrial cancer Neg Hx    Pancreatic cancer Neg Hx    Prostate cancer Neg Hx    Social History   Socioeconomic History   Marital status: Single    Spouse name: Not on file   Number of children: 2   Years of education: Not on file   Highest education level: Not on file  Occupational History   Occupation: paralegal   Occupation: Fish farm manager: [employer], [place]   Occupation: retired  Tobacco Use   Smoking status: Former    Years: 10.00    Types: Cigarettes    Quit date: 07/18/1981    Years since quitting: 40.6   Smokeless tobacco: Never  Vaping Use   Vaping Use: Never used  Substance and Sexual Activity   Alcohol use: No    Alcohol/week: 0.0 standard drinks of alcohol   Drug use: No   Sexual activity: Not Currently    Birth control/protection: Post-menopausal  Other Topics Concern   Not on file  Social History Narrative    The patient is a Radio broadcast assistant, she is divorced and widowed , 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   Unemployed as of summer 2014, job eliminated   1 son one daughter   No caffeine   Updated as of 03/30/2013   Social Determinants of Health   Financial Resource Strain: Not on file  Food Insecurity: Not on file  Transportation Needs: Not on file  Physical Activity: Not on file  Stress: Not on file  Social Connections: Not on file    Tobacco Counseling Counseling given: Not Answered   Clinical Intake:                 Diabetic?no         Activities of Daily Living    02/28/2022   11:10 AM 09/28/2021    9:13 AM  In your present state of health, do you have any difficulty performing the following activities:  Hearing? 0 0  Vision? 0 0  Difficulty concentrating or making decisions? 0 0  Walking or climbing stairs? 0 1  Dressing or bathing? 0 0  Doing errands, shopping? 0 0    Patient Care Team: Teola Bradley, MD as PCP - General (Family Medicine)  Indicate  any recent Medical Services you may have received from other than [facility] providers in the past year (date may be approximate).     Assessment:   This is a routine wellness examination for [name].  Hearing/Vision screen No results found.  Dietary issues and exercise activities discussed:     Goals Addressed   None   Depression Screen    02/28/2022  11:11 AM 09/28/2021    9:14 AM 09/04/2021   10:52 AM 06/25/2021   10:37 AM 08/30/2020   10:11 AM 04/20/2020    2:05 PM 01/20/2020    3:46 PM  PHQ 2/9 Scores  PHQ - 2 Score 0 0 0 1 0 0 1  PHQ- 9 Score    1 0 0 1    Fall Risk    02/28/2022   11:10 AM 09/28/2021    9:13 AM 09/04/2021   10:46 AM 06/25/2021   10:33 AM 08/30/2020   10:11 AM  Fall Risk   Falls in the past year? 0 0 0 0 0  Number falls in past yr: 0 0 0 0   Injury with Fall? 0 0 0 0   Risk for fall due to : No Fall Risks No Fall Risks No Fall Risks No Fall Risks No Fall Risks  Follow up  Falls evaluation completed Falls evaluation completed Falls evaluation completed     FALL RISK PREVENTION PERTAINING TO THE HOME:  Any stairs in or around the home? No  If so, are there any without handrails? No  Home free of loose throw rugs in walkways, pet beds, electrical cords, etc? No  Adequate lighting in your home to reduce risk of falls? Yes   ASSISTIVE DEVICES UTILIZED TO PREVENT FALLS:  Life alert? No  Use of a cane, walker or w/c? No  Grab bars in the bathroom? No  Shower chair or bench in shower? No  Elevated toilet seat or a handicapped toilet? No   TIMED UP AND GO:  Was the test performed? No .  Length of time to ambulate 10 feet: 0 sec.   Gait slow and steady without use of assistive device  Cognitive Function:        Immunizations Immunization History  Administered Date(s) Administered   Influenza Split 04/18/2011, 04/01/2012   Influenza Whole 04/09/2007, 03/14/2010   Influenza,inj,Quad PF,6+ Mos 02/20/2013, 02/16/2014, 03/14/2015, 05/08/2016,  07/23/2017, 01/28/2018, 03/10/2019, 04/20/2020   Moderna Sars-Covid-2 Vaccination 12/29/2019, 01/26/2020   Pneumococcal Conjugate-13 01/28/2018   Pneumococcal Polysaccharide-23 03/10/2019   Tdap 07/18/2010    TDAP status: Up to date  Flu Vaccine status: Up to date  Pneumococcal vaccine status: Up to date  Covid-19 vaccine status: Completed vaccines  Qualifies for Shingles Vaccine? Yes   Zostavax completed No   Shingrix Completed?: No.    Education has been provided regarding the importance of this vaccine. Patient has been advised to call insurance company to determine out of pocket expense if they have not yet received this vaccine. Advised may also receive vaccine at local pharmacy or Health Dept. Verbalized acceptance and understanding.  Screening Tests Health Maintenance  Topic Date Due   Zoster Vaccines- Shingrix (1 of 2) Never done   COVID-19 Vaccine (3 - Moderna risk series) 02/23/2020   TETANUS/TDAP  07/18/2020   INFLUENZA VACCINE  01/01/2022   COLONOSCOPY (Pts 45-73yr Insurance coverage will need to be confirmed)  11/12/2022   MAMMOGRAM  11/02/2023   Pneumonia Vaccine 71 Years old  Completed   DEXA SCAN  Completed   Hepatitis C Screening  Completed   HPV VACCINES  Aged Out    Health Maintenance  Health Maintenance Due  Topic Date Due   Zoster Vaccines- Shingrix (1 of 2) Never done   COVID-19 Vaccine (3 - Moderna risk series) 02/23/2020   TETANUS/TDAP  07/18/2020   INFLUENZA VACCINE  01/01/2022    Colorectal cancer screening: Type of screening:  Colonoscopy. Completed 11/19/2017. Repeat every 5 years  Mammogram status: Completed 11/01/2021. Repeat every year  Bone Density status: Completed 04/16/2018. Results reflect: Bone density results: NORMAL. Repeat every 2 years.  Lung Cancer Screening: (Low Dose CT Chest recommended if Age 47-80 years, 30 pack-year currently smoking OR have quit w/in 15years.) does not qualify.   Lung Cancer Screening Referral:  n/a  Additional Screening:  Hepatitis C Screening: does not qualify; Completed 11/21/2015  Vision Screening: Recommended annual ophthalmology exams for early detection of glaucoma and other disorders of the eye. Is the patient up to date with their annual eye exam?   yes Who is the provider or what is the name of the office in which the patient attends annual eye exams? [facility]  If pt is not established with a provider, would they like to be referred to a provider to establish care? No .   Dental Screening: Recommended annual dental exams for proper oral hygiene  Community Resource Referral / Chronic Care Management: CRR required this visit?  No   CCM required this visit?  No      Plan:     I have personally reviewed and noted the following in the patient's chart:   Medical and social history Use of alcohol, tobacco or illicit drugs  Current medications and supplements including opioid prescriptions. Patient is not currently taking opioid prescriptions. Functional ability and status Nutritional status Physical activity Advanced directives List of other physicians Hospitalizations, surgeries, and ER visits in previous 12 months Vitals Screenings to include cognitive, depression, and falls Referrals and appointments  In addition, I have reviewed and discussed with patient certain preventive protocols, quality metrics, and best practice recommendations. A written personalized care plan for preventive services as well as general preventive health recommendations were provided to patient.     Despina Hidden [place], [place]   02/28/2022   Nurse Notes: face to face 25 minutes   Ms. Penny Pia , Thank you for taking time to come for your Medicare Wellness Visit. I appreciate your ongoing commitment to your health goals. Please review the following plan we discussed and let me know if I can assist you in the future.   These are the goals we discussed:  Goals       Exercise 150 min/wk Moderate Activity     Weight (lb) < 270 lb (122.5 kg)     Weight < 270 lb (122.471 kg)        This is a list of the screening recommended for you and due dates:  Health Maintenance  Topic Date Due   Zoster (Shingles) Vaccine (1 of 2) Never done   COVID-19 Vaccine (3 - Moderna risk series) 02/23/2020   Tetanus Vaccine  07/18/2020   Flu Shot  01/01/2022   Colon Cancer Screening  11/12/2022   Mammogram  11/02/2023   Pneumonia Vaccine  Completed   DEXA scan (bone density measurement)  Completed   Hepatitis C Screening: USPSTF Recommendation to screen - Ages 78-79 yo.  Completed   HPV Vaccine  Aged Out

## 2022-02-28 NOTE — Patient Instructions (Signed)

## 2022-02-28 NOTE — Assessment & Plan Note (Signed)
Patient blood pressure was not well controlled on hydrochlorothiazide.  The medication was changed to combination of olmesartan-amlodipine-hydrochlorothiazide 20-5-12.5 in April 2023.  Patient was still remained hypertensive.  The dosage of the same medication was increased to 40-10- 12.5.  Medication with new dosage is adequately controlling patient's blood pressure.  Patient reports her home blood pressure reading stays around 130/80.  Patient is tolerating the medication well without adverse effects.  Plan is to continue the medication with the same dosage.  Patient was encouraged to monitor the blood pressure at home.

## 2022-03-01 NOTE — Progress Notes (Signed)
Internal Medicine Clinic Attending  I saw and evaluated the patient.  I personally confirmed the key portions of the history and exam documented by Dr. Multani and I reviewed pertinent patient test results.  The assessment, diagnosis, and plan were formulated together and I agree with the documentation in the resident's note.  

## 2022-04-01 ENCOUNTER — Other Ambulatory Visit: Payer: Self-pay

## 2022-04-01 DIAGNOSIS — E7800 Pure hypercholesterolemia, unspecified: Secondary | ICD-10-CM

## 2022-04-01 MED ORDER — ROSUVASTATIN CALCIUM 20 MG PO TABS: 20.0000 mg | ORAL_TABLET | Freq: Every day | ORAL | 1 refills | Status: DC

## 2022-04-01 NOTE — Telephone Encounter (Signed)
  rosuvastatin (CRESTOR) 20 MG tablet (Expired)  refill request @ [facility] [phone] - [place], [place].

## 2022-04-02 ENCOUNTER — Other Ambulatory Visit: Payer: Self-pay | Admitting: Internal Medicine

## 2022-04-02 DIAGNOSIS — I10 Essential (primary) hypertension: Secondary | ICD-10-CM

## 2022-05-15 ENCOUNTER — Ambulatory Visit
Admission: RE | Admit: 2022-05-15 | Discharge: 2022-05-15 | Disposition: A | Discharge status: Home / Self Care | Payer: Medicare Other | Source: Ambulatory Visit | Attending: Internal Medicine | Admitting: Internal Medicine

## 2022-05-15 DIAGNOSIS — N6489 Other specified disorders of breast: Secondary | ICD-10-CM

## 2022-05-15 DIAGNOSIS — R928 Other abnormal and inconclusive findings on diagnostic imaging of breast: Secondary | ICD-10-CM | POA: Diagnosis not present

## 2022-07-23 ENCOUNTER — Encounter: Payer: Self-pay | Admitting: Gynecologic Oncology

## 2022-07-25 ENCOUNTER — Inpatient Hospital Stay: Payer: 59 | Attending: Gynecologic Oncology | Admitting: Gynecologic Oncology

## 2022-07-25 ENCOUNTER — Other Ambulatory Visit: Payer: Self-pay

## 2022-07-25 ENCOUNTER — Encounter: Payer: Self-pay | Admitting: Gynecologic Oncology

## 2022-07-25 VITALS — BP 128/67 | HR 82 | Temp 98.6°F | Resp 16 | Wt 296.0 lb

## 2022-07-25 DIAGNOSIS — Z90710 Acquired absence of both cervix and uterus: Secondary | ICD-10-CM | POA: Insufficient documentation

## 2022-07-25 DIAGNOSIS — Z90722 Acquired absence of ovaries, bilateral: Secondary | ICD-10-CM | POA: Insufficient documentation

## 2022-07-25 DIAGNOSIS — Z8542 Personal history of malignant neoplasm of other parts of uterus: Secondary | ICD-10-CM | POA: Diagnosis present

## 2022-07-25 DIAGNOSIS — C541 Malignant neoplasm of endometrium: Secondary | ICD-10-CM

## 2022-07-25 NOTE — Progress Notes (Signed)
Gynecologic Oncology Return Clinic Visit  07/25/22  Reason for Visit: Surveillance visit in the setting of endometrial cancer   Treatment History: Oncology History  Endometrial cancer, grade I ([place])  05/30/2021 Initial Biopsy   Endometrial biopsy shows FIGO grade 1 endometrioid adenocarcinoma   06/06/2021 Initial Diagnosis   Endometrial cancer, grade I ([place])   07/10/2021 Surgery   TRH/BSO, bilateral SLN bx, reduction of umbilical hernia  Findings:  On EUA, 10cm mobile uterus. On intra-abdominal entry, normal upper abdominal survey. Small amount of omentum within the umbilical hernia, easily reduced. Normal small and large bowel. Normal omentum. 12 cm bulbous uterus. Normal bilateral adnexa. Mapping successful to right common and left external iliac SLNs. No intra-abdominal or pelvic evidence of disease.    07/10/2021 Pathology Results   Stage IA, grade 1 endometrioid adenocarcinoma MI: 25% No LVI SLNs negative     Interval History: Doing well. Denies any vaginal leading.  Denies any pelvic or domino pain.  Reports good appetite without nausea or emesis.  Past Medical/Surgical History: Past Medical History:  Diagnosis Date   Acanthosis nigricans    Allergic rhinitis    Asthma    in past, no issues her 47s   Colonic polyp    Hyperplastic, no adenomatous or malignant changes.    Elevated LFTs    Resolved- etiology unclear hep B, Hep C serology neg 2005   Endometrial polyp    benign 12/06   Gastric ulcer 02/20/2013   Gastric ulcer with hemorrhage 02/20/2013   GERD (gastroesophageal reflux disease)    Hx of abnormal cervical Pap smear    1980 normal in 2006.    Hyperlipidemia    Hypertension    Iron deficiency anemia    postmenopausal bleeding followed by Dr. Kalman Shan.  Hx of benign endometrial polyp.    Lumbar back pain    Obesity    morbid obesity   Pre-diabetes    Premenopausal menorrhagia    Right knee pain     Past Surgical History:  Procedure Laterality Date    COLONOSCOPY     COLONOSCOPY N/A 05/05/2013   Procedure: COLONOSCOPY;  Surgeon: Gatha Mayer, MD;  Location: [facility];  Service: Endoscopy;  Laterality: N/A;   ESOPHAGOGASTRODUODENOSCOPY N/A 02/20/2013   Procedure: ESOPHAGOGASTRODUODENOSCOPY (EGD);  Surgeon: Milus Banister, MD;  Location: [place];  Service: Endoscopy;  Laterality: N/A;   ESOPHAGOGASTRODUODENOSCOPY N/A 05/05/2013   Procedure: ESOPHAGOGASTRODUODENOSCOPY (EGD);  Surgeon: Gatha Mayer, MD;  Location: [facility];  Service: Endoscopy;  Laterality: N/A;   ROBOTIC ASSISTED TOTAL HYSTERECTOMY WITH BILATERAL SALPINGO OOPHERECTOMY Bilateral 07/10/2021   Procedure: XI ROBOTIC ASSISTED TOTAL HYSTERECTOMY WITH BILATERAL SALPINGO OOPHORECTOMY;  Surgeon: [name], MD;  Location: [facility];  Service: Gynecology;  Laterality: Bilateral;   SENTINEL NODE BIOPSY N/A 07/10/2021   Procedure: SENTINEL NODE BIOPSY;  Surgeon: [name], MD;  Location: [facility];  Service: Gynecology;  Laterality: N/A;   THERAPEUTIC ABORTION  1975    Family History  Problem Relation Age of Onset   Hypertension Mother    Breast cancer Mother 42   Alcohol abuse Father    Hypertension Father    Heart disease Father    Gout Father    Breast cancer Sister        91s   Hypertension Sister    Hypertension Sister    Colon cancer Neg Hx    Ovarian cancer Neg Hx    Endometrial cancer Neg Hx    Pancreatic cancer Neg  Hx    Prostate cancer Neg Hx     Social History   Socioeconomic History   Marital status: Single    Spouse name: Not on file   Number of children: 2   Years of education: Not on file   Highest education level: Not on file  Occupational History   Occupation: paralegal   Occupation: SECRETARY/RECEPTIONIST    Employer: [employer]   Occupation: retired  Tobacco Use   Smoking status: Former    Years: 10.00    Types: Cigarettes    Quit date: 07/18/1981    Years since quitting: 41.0   Smokeless  tobacco: Never  Vaping Use   Vaping Use: Never used  Substance and Sexual Activity   Alcohol use: No    Alcohol/week: 0.0 standard drinks of alcohol   Drug use: No   Sexual activity: Not Currently    Birth control/protection: Post-menopausal  Other Topics Concern   Not on file  Social History Narrative    The patient is a Radio broadcast assistant, she is divorced and widowed , 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   Unemployed as of summer 2014, job eliminated   1 son one daughter   No caffeine   Updated as of 03/30/2013   Social Determinants of Health   Financial Resource Strain: Low Risk  (02/28/2022)   Overall Financial Resource Strain (CARDIA)    Difficulty of Paying Living Expenses: Not hard at all  Food Insecurity: No Food Insecurity (02/28/2022)   Hunger Vital Sign    Worried About Running Out of Food in the Last Year: Never true    Ran Out of Food in the Last Year: Never true  Transportation Needs: No Transportation Needs (02/28/2022)   PRAPARE - Hydrologist (Medical): No    Lack of Transportation (Non-Medical): No  Physical Activity: Inactive (02/28/2022)   Exercise Vital Sign    Days of Exercise per Week: 0 days    Minutes of Exercise per Session: 0 min  Stress: No Stress Concern Present (02/28/2022)   [place]    Feeling of Stress : Not at all  Social Connections: Moderately Isolated (02/28/2022)   Social Connection and Isolation Panel [NHANES]    Frequency of Communication with Friends and Family: Once a week    Frequency of Social Gatherings with Friends and Family: Never    Attends Religious Services: More than 4 times per year    Active Member of [employer] or Organizations: Yes    Attends Music therapist: More than 4 times per year    Marital Status: Divorced    Current Medications:  Current Outpatient Medications:    Multiple Vitamins-Minerals (ALIVE WOMENS 50+ GUMMY)  CHEW, Chew 2 tablets by mouth daily., Disp: , Rfl:    Olmesartan-amLODIPine-HCTZ 40-10-12.5 MG TABS, TAKE ONE TABLET BY MOUTH DAILY, Disp: 90 tablet, Rfl: 1   Polyethyl Glycol-Propyl Glycol (SYSTANE OP), Place 1 drop into both eyes 2 (two) times daily as needed (dry eyes)., Disp: , Rfl:    rosuvastatin (CRESTOR) 20 MG tablet, Take 1 tablet (20 mg total) by mouth daily., Disp: 90 tablet, Rfl: 1  Review of Systems: Denies appetite changes, fevers, chills, fatigue, unexplained weight changes. Denies hearing loss, neck lumps or masses, mouth sores, ringing in ears or voice changes. Denies cough or wheezing.  Denies shortness of breath. Denies chest pain or palpitations. Denies leg swelling. Denies abdominal distention, pain,  blood in stools, constipation, diarrhea, nausea, vomiting, or early satiety. Denies pain with intercourse, dysuria, frequency, hematuria or incontinence. Denies hot flashes, pelvic pain, vaginal bleeding or vaginal discharge.   Denies joint pain, back pain or muscle pain/cramps. Denies itching, rash, or wounds. Denies dizziness, headaches, numbness or seizures. Denies swollen lymph nodes or glands, denies easy bruising or bleeding. Denies anxiety, depression, confusion, or decreased concentration.  Physical Exam: BP 128/67 (BP Location: Right Wrist, Patient Position: Sitting)   Pulse 82   Temp 98.6 F (37 C) (Oral)   Resp 16   Wt 296 lb (134.3 kg)   SpO2 98%   BMI 48.51 kg/m  General: Alert, oriented, no acute distress. HEENT: Normocephalic, atraumatic, sclera anicteric. Chest: Clear to auscultation bilaterally.  No wheezes or rhonchi. Cardiovascular: Regular rate and rhythm, no murmurs. Abdomen: Obese, soft, nontender.  Normoactive bowel sounds.  No masses or hepatosplenomegaly appreciated.  Well-healed incisions. Extremities: Grossly normal range of motion.  Warm, well perfused.  No edema bilaterally. Skin: No rashes or lesions noted. Lymphatics: No cervical,  supraclavicular, or inguinal adenopathy. GU: Normal appearing external genitalia without erythema, excoriation, or lesions.  Speculum exam reveals well rugated vaginal mucosa.  Some redundancy of the vaginal walls.  No masses noted.  No bleeding or discharge.  Bimanual exam reveals cuff intact, no nodularity or masses.  Rectovaginal exam confirms these findings.  Laboratory & Radiologic Studies: None new  Assessment & Plan: [name] is a 72 y.o. woman with Stage IA2 grade 1 endometrioid endometrial adenocarcinoma who presents for surveillance. Surgery in 07/2021. MMR IHC intact.  MS-stable.   Patient is overall doing well.  NED on exam today.   Per NCCN surveillance recommendations, we discussed need for surveillance visits every 6 months.  We will plan for her next visit to be with Joylene John in 6 months and I will see her back in 1 year.  We reviewed signs and symptoms that would be concerning for cancer recurrence and I stressed the importance of calling if she develops any of these between visits.  She has called at least 1 office to try to establish with an OB/GYN but was told that they did not take her insurance.  I offered that I could have my office call [facility] (she was interested in being seen there) or that she could call her insurance to see if any of the providers there are covered.  She will call and let us know where she would like a referral sent.  20 minutes of total time was spent for this patient encounter, including preparation, face-to-face counseling with the patient and coordination of care, and documentation of the encounter.  Jeral Pinch, MD  Division of Gynecologic Oncology  Department of Obstetrics and Gynecology  [facility]

## 2022-07-25 NOTE — Patient Instructions (Signed)
It was good to see you today.  I do not see or feel any evidence of cancer recurrence on your exam.  You will see Melissa for a visit in 6 months.  I will see you for follow-up in 12 months.  Please call back sometime in November of this year to get a visit scheduled to see me in February 2025.  As always, if you develop any new and concerning symptoms before your next visit, please call to see me sooner.

## 2022-08-04 NOTE — Progress Notes (Signed)
CC: Follow-up  HPI:   [name] is a 72 y.o. female with a past medical history of obesity, prediabetes, hypertension, hyperlipidemia, iron-deficiency anemia, and lumbar back pain who presents for routine follow-up. She was last seen at [facility] in 02-2022.    Past Medical History:  Diagnosis Date   Acanthosis nigricans    Allergic rhinitis    Asthma    in past, no issues her 50s   Colonic polyp    Hyperplastic, no adenomatous or malignant changes.    Elevated LFTs    Resolved- etiology unclear hep B, Hep C serology neg 2005   Endometrial polyp    benign 12/06   Gastric ulcer 02/20/2013   Gastric ulcer with hemorrhage 02/20/2013   GERD (gastroesophageal reflux disease)    Hx of abnormal cervical Pap smear    1980 normal in 2006.    Hyperlipidemia    Hypertension    Iron deficiency anemia    postmenopausal bleeding followed by Dr. Kalman Shan.  Hx of benign endometrial polyp.    Lumbar back pain    Obesity    morbid obesity   Pre-diabetes    Premenopausal menorrhagia    Right knee pain      Review of Systems:    Reports bright red blood per rectum Denies abdominal pain, nausea, vomiting, straining or pain with defecation, melena, lightheadedness, dizziness, fatigue     Physical Exam:  Vitals:   08/12/22 1009  BP: (!) 124/59  Pulse: 85  SpO2: 98%  Weight: 296 lb 9.6 oz (134.5 kg)  Height: 5' 5.5" (1.664 m)    General:   awake and alert, sitting comfortably in chair, cooperative, not in acute distress Skin:   warm and dry, intact without any obvious lesions or scars, no rashes Eyes:   extraocular movements intact, conjunctivae pink, pupils round Lungs:   normal respiratory effort, breathing unlabored, symmetrical chest rise, no crackles or wheezing Cardiac:   regular rate and rhythm, normal S1 and S2, no pitting edema Abdomen:   soft and non-distended, normoactive bowel sounds present in all four quadrants, no guarding or palpable masses Neurologic:    oriented to person-place-time, moving all extremities, no gross focal deficits Psychiatric:   euthymic mood with congruent affect, intelligible speech    Assessment & Plan:   Hyperlipidemia Patient has history of hyperlipidemia managed with rosuvastatin. She reports daily adherence to this medication. Tolerating rosuvastatin well without any myalgias, fatigue, headache, diarrhea, nausea, or abdominal pain. Most recent lipid panel collected 06-2021 was within normal limits including LDL<90. Repeat lipid panel performed today.  - Continue rosuvastatin '20mg'$  q24 - Check lipid panel    Essential hypertension Patient has history of hypertension and currently takes combination olmesartan-amlodipine-hydrochlorothiazide. The dose of this medication was increased in 09-2021 and blood pressure adequately controlled at last [facility] visit 02-2022. Reports taking this medication daily. Home blood pressure values have been in the 120-130s/60s range. Today in clinic, her BP was 124/59. Denies lightheadedness, dizziness, blurry vision, headache, anxiety, and chest pain. Last BMP in 09-2021 was within normal limits. Given increased dose of antihypertensive medications, BMP recheck is indicated to monitor electrolyte and renal function.   - Continue olmesartan-amlodipine-hydrochlorothiazide 40-10-12.'5mg'$  q24 - Check BMP    Prediabetes Patient has history of prediabetes, most recent A1C collected in 06-2021 was 6.2. At that time, she was encouraged to exercise and consume a low-fat diet. Since her last visit, she reports intermittently walking around the house as exercise and working with her  daughter to cook healthier foods.   - Encourage ?152mn moderate exercise including walking per week - Recommend whole food diet rich in plant-based ingredients  - Check A1C    Bright red blood per rectum Patient reports one isolated episode of bright red blood drops in the toilet that occurred about 2-3 weeks ago. She has  never previously noticed blood in her stool and this episode never recurred in the last 1-2 weeks. Reports experiencing constipation several years ago, which she attributed to hemorrhoids. At that time, she took Preparation H and modified her diet. The constipation has since resolved. Denies straining or pain with defecation, abdominal pain, melena, nausea, vomiting, lightheadedness, dizziness, and fatigue. Abdominal exam in clinic today was unremarkable. Last colonoscopy was 2019, due for routine screening again in 11-2022. Low concern for gastrointestinal bleed at this time given isolated episode, trivial amount of blood loss reported, unremarkable history, and benign exam findings. Referral to gastroenterology has been placed for routine colonoscopy.  - Gastroenterology referral for colonoscopy     Healthcare maintenance Patient received both RSV and DTaP vaccinations within the past year.   - Gastroenterology referral for colonoscopy       See Encounters Tab for problem based charting.  Patient discussed with Dr. MDaryll Drown

## 2022-08-05 ENCOUNTER — Telehealth: Payer: Self-pay | Admitting: Surgery

## 2022-08-05 NOTE — Telephone Encounter (Signed)
Patient called in stating she saw Dr Berline Lopes last week and was told to call with any concerning symptoms. She states that the day after her appointment with Dr Berline Lopes she went to the bathroom and noticed a few drops of blood in the toilet coming from her rectum. States this only happened one time but she wasn't sure if it was something she needs to come in for. States she is having regular bowel movements and denies pain of any vaginal bleeding. Denies fevers or chills or urinary symptoms of frequency, urgency, or burning. Advised patient to continue to monitor her symptoms and that Dr Berline Lopes would be notified and our office will call her back if there are any additional recommendations.

## 2022-08-12 ENCOUNTER — Encounter: Payer: Self-pay | Admitting: Student

## 2022-08-12 ENCOUNTER — Ambulatory Visit (INDEPENDENT_AMBULATORY_CARE_PROVIDER_SITE_OTHER): Payer: 59 | Admitting: Student

## 2022-08-12 VITALS — BP 124/59 | HR 85 | Ht 65.5 in | Wt 296.6 lb

## 2022-08-12 DIAGNOSIS — K625 Hemorrhage of anus and rectum: Secondary | ICD-10-CM | POA: Diagnosis not present

## 2022-08-12 DIAGNOSIS — I10 Essential (primary) hypertension: Secondary | ICD-10-CM | POA: Diagnosis not present

## 2022-08-12 DIAGNOSIS — Z0000 Encounter for general adult medical examination without abnormal findings: Secondary | ICD-10-CM

## 2022-08-12 DIAGNOSIS — E785 Hyperlipidemia, unspecified: Secondary | ICD-10-CM | POA: Diagnosis not present

## 2022-08-12 DIAGNOSIS — K921 Melena: Secondary | ICD-10-CM

## 2022-08-12 DIAGNOSIS — R7303 Prediabetes: Secondary | ICD-10-CM | POA: Diagnosis not present

## 2022-08-12 LAB — GLUCOSE, CAPILLARY: Glucose-Capillary: 112 mg/dL — ABNORMAL HIGH (ref 70–99)

## 2022-08-12 LAB — POCT GLYCOSYLATED HEMOGLOBIN (HGB A1C): Hemoglobin A1C: 6.5 % — AB (ref 4.0–5.6)

## 2022-08-12 NOTE — Assessment & Plan Note (Signed)
Patient has history of prediabetes, most recent A1C collected in 06-2021 was 6.2. At that time, she was encouraged to exercise and consume a low-fat diet. Since her last visit, she reports intermittently walking around the house as exercise and working with her daughter to cook healthier foods.   - Encourage ?141mn moderate exercise including walking per week - Recommend whole food diet rich in plant-based ingredients  - Check A1C

## 2022-08-12 NOTE — Patient Instructions (Signed)
  Thank you, Ms.Theora Gianotti, for allowing [place] to provide your care today. Today we discussed . . .  > Hypertension       - keep up the great work with checking your blood pressure regularly and taking your medication every day       - your blood pressure values have been great, so keep up the good work       - we are ordering some labs today and will call you with the results > Hyperlipidemia       - keep taking your rosuvastatin every day       - we are ordering a lipid panel today and will call you with the results > Prediabetes       - your A1C has been in the prediabetic range       - we are checking an A1C and will call you with the results   I have ordered the following labs for you:   Lab Orders         Lipid Profile         BMP8+Anion Gap         POC Hbg A1C       Tests ordered today:  none   Referrals ordered today:    Referral Orders         Ambulatory referral to Gastroenterology       I have ordered the following medication/changed the following medications:   Stop the following medications: There are no discontinued medications.   Start the following medications: No orders of the defined types were placed in this encounter.     Follow up: 3 months    Remember:  Please continue to take your blood pressure and cholesterol medications. I have sent you a referral to gastroenterology for a colonoscopy. We will see you again in about three months!   Should you have any questions or concerns please call the internal medicine clinic at [phone].     Roswell Nickel, MD [place]

## 2022-08-12 NOTE — Assessment & Plan Note (Signed)
Patient has history of hypertension and currently takes combination olmesartan-amlodipine-hydrochlorothiazide. The dose of this medication was increased in 09-2021 and blood pressure adequately controlled at last [facility] visit 02-2022. Reports taking this medication daily. Home blood pressure values have been in the 120-130s/60s range. Today in clinic, her BP was 124/59. Denies lightheadedness, dizziness, blurry vision, headache, anxiety, and chest pain. Last BMP in 09-2021 was within normal limits. Given increased dose of antihypertensive medications, BMP recheck is indicated to monitor electrolyte and renal function.   - Continue olmesartan-amlodipine-hydrochlorothiazide 40-10-12.'5mg'$  q24 - Check BMP

## 2022-08-12 NOTE — Assessment & Plan Note (Addendum)
Patient reports one isolated episode of bright red blood drops in the toilet that occurred about 2-3 weeks ago. She has never previously noticed blood in her stool and this episode never recurred in the last 1-2 weeks. Reports experiencing constipation several years ago, which she attributed to hemorrhoids. At that time, she took Preparation H and modified her diet. The constipation has since resolved. Denies straining or pain with defecation, abdominal pain, melena, nausea, vomiting, lightheadedness, dizziness, and fatigue. Abdominal exam in clinic today was unremarkable. Last colonoscopy was 2019, due for routine screening again in 11-2022. Low concern for gastrointestinal bleed at this time given isolated episode, trivial amount of blood loss reported, unremarkable history, and benign exam findings. Referral to gastroenterology has been placed for routine colonoscopy.  - Gastroenterology referral for colonoscopy

## 2022-08-12 NOTE — Assessment & Plan Note (Signed)
Patient has history of hyperlipidemia managed with rosuvastatin. She reports daily adherence to this medication. Tolerating rosuvastatin well without any myalgias, fatigue, headache, diarrhea, nausea, or abdominal pain. Most recent lipid panel collected 06-2021 was within normal limits including LDL<90. Repeat lipid panel performed today.  - Continue rosuvastatin '20mg'$  q24 - Check lipid panel

## 2022-08-12 NOTE — Assessment & Plan Note (Addendum)
Patient received both RSV and DTaP vaccinations within the past year.   - Gastroenterology referral for colonoscopy

## 2022-08-13 ENCOUNTER — Encounter: Payer: Self-pay | Admitting: Internal Medicine

## 2022-08-13 LAB — BMP8+ANION GAP
Anion Gap: 15 mmol/L (ref 10.0–18.0)
BUN/Creatinine Ratio: 16 (ref 12–28)
BUN: 15 mg/dL (ref 8–27)
CO2: 22 mmol/L (ref 20–29)
Calcium: 9.1 mg/dL (ref 8.7–10.3)
Chloride: 102 mmol/L (ref 96–106)
Creatinine, Ser: 0.94 mg/dL (ref 0.57–1.00)
Glucose: 109 mg/dL — ABNORMAL HIGH (ref 70–99)
Potassium: 3.9 mmol/L (ref 3.5–5.2)
Sodium: 139 mmol/L (ref 134–144)
eGFR: 66 mL/min/{1.73_m2} (ref >59)

## 2022-08-13 LAB — LIPID PANEL
Chol/HDL Ratio: 2.5 ratio (ref 0.0–4.4)
Cholesterol, Total: 145 mg/dL (ref 100–199)
HDL: 59 mg/dL (ref >39)
LDL Chol Calc (NIH): 71 mg/dL (ref 0–99)
Triglycerides: 76 mg/dL (ref 0–149)
VLDL Cholesterol Cal: 15 mg/dL (ref 5–40)

## 2022-08-16 NOTE — Progress Notes (Signed)
Internal Medicine Clinic Attending  Case discussed with Dr. Harper  at the time of the visit.  We reviewed the resident's history and exam and pertinent patient test results.  I agree with the assessment, diagnosis, and plan of care documented in the resident's note.  

## 2022-09-30 ENCOUNTER — Other Ambulatory Visit: Payer: Self-pay | Admitting: *Deleted

## 2022-09-30 ENCOUNTER — Other Ambulatory Visit: Payer: Self-pay | Admitting: Student

## 2022-09-30 DIAGNOSIS — I10 Essential (primary) hypertension: Secondary | ICD-10-CM

## 2022-09-30 DIAGNOSIS — E7800 Pure hypercholesterolemia, unspecified: Secondary | ICD-10-CM

## 2022-09-30 MED ORDER — ROSUVASTATIN CALCIUM 20 MG PO TABS: 20.0000 mg | ORAL_TABLET | Freq: Every day | ORAL | 1 refills | Status: DC

## 2022-09-30 MED ORDER — OLMESARTAN-AMLODIPINE-HCTZ 40-10-12.5 MG PO TABS: 1.0000 | ORAL_TABLET | Freq: Every day | ORAL | 1 refills | Status: DC

## 2022-09-30 NOTE — Telephone Encounter (Signed)
Refill request sent to [facility] Team

## 2022-09-30 NOTE — Telephone Encounter (Signed)
  rosuvastatin (CRESTOR) 20 MG tablet (Expired   Olmesartan-amLODIPine-HCTZ 40-10-12.5 MG TABS   [facility] [id number] - [place], [place] - [address]

## 2022-10-02 ENCOUNTER — Other Ambulatory Visit: Payer: Self-pay | Admitting: Student

## 2022-10-02 DIAGNOSIS — E7800 Pure hypercholesterolemia, unspecified: Secondary | ICD-10-CM

## 2022-10-11 ENCOUNTER — Ambulatory Visit (AMBULATORY_SURGERY_CENTER): Payer: 59

## 2022-10-11 VITALS — Ht 65.0 in | Wt 289.0 lb

## 2022-10-11 DIAGNOSIS — Z86010 Personal history of colonic polyps: Secondary | ICD-10-CM

## 2022-10-11 MED ORDER — NA SULFATE-K SULFATE-MG SULF 17.5-3.13-1.6 GM/177ML PO SOLN: 1.0000 | Freq: Once | ORAL | 0 refills | Status: AC

## 2022-10-11 NOTE — Progress Notes (Signed)

## 2022-10-29 ENCOUNTER — Telehealth: Payer: Self-pay | Admitting: *Deleted

## 2022-10-29 NOTE — Telephone Encounter (Signed)
Moved patient's appt from 8/12 to 8/19

## 2022-11-03 ENCOUNTER — Encounter: Payer: Self-pay | Admitting: Certified Registered Nurse Anesthetist

## 2022-11-08 ENCOUNTER — Encounter: Payer: Self-pay | Admitting: Internal Medicine

## 2022-11-08 ENCOUNTER — Ambulatory Visit (AMBULATORY_SURGERY_CENTER): Payer: 59 | Admitting: Internal Medicine

## 2022-11-08 VITALS — BP 96/55 | HR 65 | Temp 97.7°F | Resp 16 | Ht 65.5 in | Wt 289.0 lb

## 2022-11-08 DIAGNOSIS — D124 Benign neoplasm of descending colon: Secondary | ICD-10-CM | POA: Diagnosis not present

## 2022-11-08 DIAGNOSIS — Z09 Encounter for follow-up examination after completed treatment for conditions other than malignant neoplasm: Secondary | ICD-10-CM | POA: Diagnosis not present

## 2022-11-08 DIAGNOSIS — K635 Polyp of colon: Secondary | ICD-10-CM

## 2022-11-08 DIAGNOSIS — Z86010 Personal history of colonic polyps: Secondary | ICD-10-CM | POA: Diagnosis not present

## 2022-11-08 HISTORY — DX: Morbid (severe) obesity due to excess calories: E66.01

## 2022-11-08 MED ORDER — SODIUM CHLORIDE 0.9 % IV SOLN: 500.0000 mL | INTRAVENOUS | Status: AC

## 2022-11-08 NOTE — Progress Notes (Signed)
Report given to PACU, vss 

## 2022-11-08 NOTE — Patient Instructions (Addendum)
I found and removed one very small polyp. No signs of cancer anywhere.  I will let you know pathology results and when to have another routine colonoscopy by mail and/or My Chart.  I appreciate the opportunity to care for you. Iva Boop, MD, FACG   YOU HAD AN ENDOSCOPIC PROCEDURE TODAY AT THE [facility]:   Refer to the procedure report that was given to you for any specific questions about what was found during the examination.  If the procedure report does not answer your questions, please call your gastroenterologist to clarify.  If you requested that your care partner not be given the details of your procedure findings, then the procedure report has been included in a sealed envelope for you to review at your convenience later.  YOU SHOULD EXPECT: Some feelings of bloating in the abdomen. Passage of more gas than usual.  Walking can help get rid of the air that was put into your GI tract during the procedure and reduce the bloating. If you had a lower endoscopy (such as a colonoscopy or flexible sigmoidoscopy) you may notice spotting of blood in your stool or on the toilet paper. If you underwent a bowel prep for your procedure, you may not have a normal bowel movement for a few days.  Please Note:  You might notice some irritation and congestion in your nose or some drainage.  This is from the oxygen used during your procedure.  There is no need for concern and it should clear up in a day or so.  SYMPTOMS TO REPORT IMMEDIATELY:  Following lower endoscopy (colonoscopy or flexible sigmoidoscopy):  Excessive amounts of blood in the stool  Significant tenderness or worsening of abdominal pains  Swelling of the abdomen that is new, acute  Fever of 100F or higher  For urgent or emergent issues, a gastroenterologist can be reached at any hour by calling [phone]. Do not use MyChart messaging for urgent concerns.    DIET:  We do recommend a small meal at first, but  then you may proceed to your regular diet.  Drink plenty of fluids but you should avoid alcoholic beverages for 24 hours.  MEDICATIONS: Continue present medications.  Please see handouts given to you by your recovery nurse.  FOLLOW UP: Repeat colonoscopy is recommended for surveillance. The colonoscopy date will be determined after pathology results from today's exam become available for review.  Thank you for allowing [place] to provide for your healthcare needs today.  ACTIVITY:  You should plan to take it easy for the rest of today and you should NOT DRIVE or use heavy machinery until tomorrow (because of the sedation medicines used during the test).    FOLLOW UP: Our staff will call the number listed on your records the next business day following your procedure.  We will call around 7:15- 8:00 am to check on you and address any questions or concerns that you may have regarding the information given to you following your procedure. If we do not reach you, we will leave a message.     If any biopsies were taken you will be contacted by phone or by letter within the next 1-3 weeks.  Please call us at [phone] if you have not heard about the biopsies in 3 weeks.    SIGNATURES/CONFIDENTIALITY: You and/or your care partner have signed paperwork which will be entered into your electronic medical record.  These signatures attest to the fact that that the information  above on your After Visit Summary has been reviewed and is understood.  Full responsibility of the confidentiality of this discharge information lies with you and/or your care-partner.

## 2022-11-08 NOTE — Progress Notes (Signed)
Patient states there have been no changes to medical or surgical history since time of pre-visit. 

## 2022-11-08 NOTE — Op Note (Signed)
[facility] Patient Name: [name] Procedure Date: 11/08/2022 2:03 PM MRN: [id number] Endoscopist: Iva Boop , MD, [id number] Age: 72 Referring MD:  Date of Birth: 1950/12/22 Gender: Female Account #: [id number] Procedure:                Colonoscopy Indications:              Surveillance: Personal history of adenomatous                            polyps on last colonoscopy 5 years ago, Last                            colonoscopy: 2019 Medicines:                Monitored Anesthesia Care Procedure:                Pre-Anesthesia Assessment:                           - Prior to the procedure, a History and Physical                            was performed, and patient medications and                            allergies were reviewed. The patient's tolerance of                            previous anesthesia was also reviewed. The risks                            and benefits of the procedure and the sedation                            options and risks were discussed with the patient.                            All questions were answered, and informed consent                            was obtained. Prior Anticoagulants: The patient has                            taken no anticoagulant or antiplatelet agents. ASA                            Grade Assessment: III - A patient with severe                            systemic disease. After reviewing the risks and                            benefits, the patient was deemed in satisfactory  condition to undergo the procedure.                           After obtaining informed consent, the colonoscope                            was passed under direct vision. Throughout the                            procedure, the patient's blood pressure, pulse, and                            oxygen saturations were monitored continuously. The                            Olympus PCF-H190DL (#[id number]) Colonoscope was                             introduced through the anus and advanced to the the                            cecum, identified by appendiceal orifice and                            ileocecal valve. The colonoscopy was performed                            without difficulty. The patient tolerated the                            procedure well. The quality of the bowel                            preparation was excellent. The bowel preparation                            used was SUPREP via split dose instruction. The                            ileocecal valve, appendiceal orifice, and rectum                            were photographed. Scope In: 2:12:55 PM Scope Out: 2:22:46 PM Scope Withdrawal Time: 0 hours 7 minutes 33 seconds  Total Procedure Duration: 0 hours 9 minutes 51 seconds  Findings:                 The perianal and digital rectal examinations were                            normal.                           A diminutive polyp was found in the descending  colon. The polyp was sessile. The polyp was removed                            with a cold snare. Resection and retrieval were                            complete. Verification of patient identification                            for the specimen was done. Estimated blood loss was                            minimal.                           The exam was otherwise without abnormality on                            direct and retroflexion views. Complications:            No immediate complications. Estimated Blood Loss:     Estimated blood loss was minimal. Impression:               - One diminutive polyp in the descending colon,                            removed with a cold snare. Resected and retrieved.                           - The examination was otherwise normal on direct                            and retroflexion views. Recommendation:           - Patient has a contact number available for                             emergencies. The signs and symptoms of potential                            delayed complications were discussed with the                            patient. Return to normal activities tomorrow.                            Written discharge instructions were provided to the                            patient.                           - Resume previous diet.                           - Continue present medications.                           -  Repeat colonoscopy is recommended for                            surveillance. The colonoscopy date will be                            determined after pathology results from today's                            exam become available for review. Iva Boop, MD 11/08/2022 2:30:49 PM This report has been signed electronically.

## 2022-11-08 NOTE — Progress Notes (Signed)
Called to room to assist during endoscopic procedure.  Patient ID and intended procedure confirmed with present staff. Received instructions for my participation in the procedure from the performing physician.  

## 2022-11-08 NOTE — Progress Notes (Signed)
[facility] History and Physical   Primary Care Physician:  Crissie Sickles, MD   Reason for Procedure:   Hx polyps  Plan:    colonoscopy     HPI: [name] is a 72 y.o. female for polyp surveillance exam  05/2013 - diminutive ssa repeat colon  11/11/2017 3 diminutive polyps removed 1 adenoma 2 benign mucosal  Past Medical History:  Diagnosis Date   Acanthosis nigricans    Allergic rhinitis    Colonic polyp    Hyperplastic, no adenomatous or malignant changes.    Elevated LFTs    Resolved- etiology unclear hep B, Hep C serology neg 2005   Endometrial polyp    benign 12/06   Gastric ulcer 02/20/2013   Gastric ulcer with hemorrhage 02/20/2013   GERD (gastroesophageal reflux disease)    Hx of abnormal cervical Pap smear    1980 normal in 2006.    Hyperlipidemia    Hypertension    Iron deficiency anemia    postmenopausal bleeding followed by Dr. Okey Dupre.  Hx of benign endometrial polyp.    Lumbar back pain    Morbid (severe) obesity due to excess calories (HCC) 11/08/2022   bmi 47.36   Obesity    morbid obesity   Pre-diabetes    Premenopausal menorrhagia    Right knee pain     Past Surgical History:  Procedure Laterality Date   COLONOSCOPY     COLONOSCOPY N/A 05/05/2013   Procedure: COLONOSCOPY;  Surgeon: Iva Boop, MD;  Location: [facility];  Service: Endoscopy;  Laterality: N/A;   ESOPHAGOGASTRODUODENOSCOPY N/A 02/20/2013   Procedure: ESOPHAGOGASTRODUODENOSCOPY (EGD);  Surgeon: Rachael Fee, MD;  Location: [facility];  Service: Endoscopy;  Laterality: N/A;   ESOPHAGOGASTRODUODENOSCOPY N/A 05/05/2013   Procedure: ESOPHAGOGASTRODUODENOSCOPY (EGD);  Surgeon: Iva Boop, MD;  Location: [facility];  Service: Endoscopy;  Laterality: N/A;   ROBOTIC ASSISTED TOTAL HYSTERECTOMY WITH BILATERAL SALPINGO OOPHERECTOMY Bilateral 07/10/2021   Procedure: XI ROBOTIC ASSISTED TOTAL HYSTERECTOMY WITH BILATERAL SALPINGO OOPHORECTOMY;  Surgeon: Carver Fila, MD;  Location: [facility];  Service: Gynecology;  Laterality: Bilateral;   SENTINEL NODE BIOPSY N/A 07/10/2021   Procedure: SENTINEL NODE BIOPSY;  Surgeon: Carver Fila, MD;  Location: [facility];  Service: Gynecology;  Laterality: N/A;   THERAPEUTIC ABORTION  1975    Prior to Admission medications   Medication Sig Start Date End Date Taking? Authorizing Provider  Multiple Vitamins-Minerals (ALIVE WOMENS 50+ GUMMY) CHEW Chew 2 tablets by mouth daily.   Yes [provider]  Olmesartan-amLODIPine-HCTZ 40-10-12.5 MG TABS Take 1 tablet by mouth daily. 09/30/22  Yes Merrilyn Puma, MD  rosuvastatin (CRESTOR) 20 MG tablet Take 1 tablet (20 mg total) by mouth daily. 09/30/22 03/29/23 Yes Merrilyn Puma, MD  Polyethyl Glycol-Propyl Glycol (SYSTANE OP) Place 1 drop into both eyes 2 (two) times daily as needed (dry eyes).    [provider]    Current Outpatient Medications  Medication Sig Dispense Refill   Multiple Vitamins-Minerals (ALIVE WOMENS 50+ GUMMY) CHEW Chew 2 tablets by mouth daily.     Olmesartan-amLODIPine-HCTZ 40-10-12.5 MG TABS Take 1 tablet by mouth daily. 90 tablet 1   rosuvastatin (CRESTOR) 20 MG tablet Take 1 tablet (20 mg total) by mouth daily. 90 tablet 1   Polyethyl Glycol-Propyl Glycol (SYSTANE OP) Place 1 drop into both eyes 2 (two) times daily as needed (dry eyes).     Current Facility-Administered Medications  Medication Dose Route Frequency Provider Last Rate Last Admin  0.9 %  sodium chloride infusion  500 mL Intravenous Continuous Iva Boop, MD        Allergies as of 11/08/2022   (Not on File)    Family History  Problem Relation Age of Onset   Hypertension Mother    Breast cancer Mother 51   Alcohol abuse Father    Hypertension Father    Heart disease Father    Gout Father    Breast cancer Sister        35s   Hypertension Sister    Hypertension Sister    Colon cancer Neg Hx    Ovarian cancer Neg Hx    Endometrial cancer  Neg Hx    Pancreatic cancer Neg Hx    Prostate cancer Neg Hx    Colon polyps Neg Hx    Esophageal cancer Neg Hx    Rectal cancer Neg Hx    Stomach cancer Neg Hx     Social History   Socioeconomic History   Marital status: Single    Spouse name: Not on file   Number of children: 2   Years of education: Not on file   Highest education level: Not on file  Occupational History   Occupation: paralegal   Occupation: Oceanographer: [employer]   Occupation: retired  Tobacco Use   Smoking status: Former    Years: 10    Types: Cigarettes    Quit date: 07/18/1981    Years since quitting: 41.3   Smokeless tobacco: Never  Vaping Use   Vaping Use: Never used  Substance and Sexual Activity   Alcohol use: No    Alcohol/week: 0.0 standard drinks of alcohol   Drug use: No   Sexual activity: Not Currently    Birth control/protection: Post-menopausal  Other Topics Concern   Not on file  Social History Narrative    The patient is a IT consultant, she is divorced and widowed , 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   Unemployed as of summer 2014, job eliminated   1 son one daughter   No caffeine   Updated as of 03/30/2013   Social Determinants of Health   Financial Resource Strain: Low Risk  (02/28/2022)   Overall Financial Resource Strain (CARDIA)    Difficulty of Paying Living Expenses: Not hard at all  Food Insecurity: No Food Insecurity (02/28/2022)   Hunger Vital Sign    Worried About Running Out of Food in the Last Year: Never true    Ran Out of Food in the Last Year: Never true  Transportation Needs: No Transportation Needs (02/28/2022)   PRAPARE - Administrator, Civil Service (Medical): No    Lack of Transportation (Non-Medical): No  Physical Activity: Inactive (02/28/2022)   Exercise Vital Sign    Days of Exercise per Week: 0 days    Minutes of Exercise per Session: 0 min  Stress: No Stress Concern Present (02/28/2022)    [employer] of Occupational Health - Occupational Stress Questionnaire    Feeling of Stress : Not at all  Social Connections: Moderately Isolated (02/28/2022)   Social Connection and Isolation Panel [NHANES]    Frequency of Communication with Friends and Family: Once a week    Frequency of Social Gatherings with Friends and Family: Never    Attends Religious Services: More than 4 times per year    Active Member of [employer] or Organizations: Yes    Attends Banker Meetings: More  than 4 times per year    Marital Status: Divorced  Intimate Partner Violence: Not At Risk (02/28/2022)   Humiliation, Afraid, Rape, and Kick questionnaire    Fear of Current or Ex-Partner: No    Emotionally Abused: No    Physically Abused: No    Sexually Abused: No    Review of Systems:  All other review of systems negative except as mentioned in the HPI.  Physical Exam: Vital signs BP 129/72   Pulse 76   Temp 97.7 F (36.5 C) (Skin)   Ht 5' 5.5" (1.664 m)   Wt 289 lb (131.1 kg)   SpO2 95%   BMI 47.36 kg/m   General:   Alert,  Well-developed, well-nourished, pleasant and cooperative in NAD Lungs:  Clear throughout to auscultation.   Heart:  Regular rate and rhythm; no murmurs, clicks, rubs,  or gallops. Abdomen:  Soft, nontender and nondistended. Normal bowel sounds.   Neuro/Psych:  Alert and cooperative. Normal mood and affect. A and O x 3   @Frederick Marro  Sena Slate, MD, [facility] [phone] (pager) 11/08/2022 2:02 PM@

## 2022-11-12 ENCOUNTER — Other Ambulatory Visit: Payer: Self-pay | Admitting: Student

## 2022-11-12 DIAGNOSIS — Z1231 Encounter for screening mammogram for malignant neoplasm of breast: Secondary | ICD-10-CM

## 2022-11-19 ENCOUNTER — Encounter: Payer: Self-pay | Admitting: Internal Medicine

## 2022-12-11 ENCOUNTER — Ambulatory Visit
Admission: RE | Admit: 2022-12-11 | Discharge: 2022-12-11 | Disposition: A | Discharge status: Home / Self Care | Payer: 59 | Source: Ambulatory Visit | Attending: Internal Medicine | Admitting: Internal Medicine

## 2022-12-11 DIAGNOSIS — Z1231 Encounter for screening mammogram for malignant neoplasm of breast: Secondary | ICD-10-CM

## 2022-12-30 ENCOUNTER — Ambulatory Visit (INDEPENDENT_AMBULATORY_CARE_PROVIDER_SITE_OTHER): Payer: 59 | Admitting: Student

## 2022-12-30 ENCOUNTER — Encounter: Payer: Self-pay | Admitting: Student

## 2022-12-30 VITALS — BP 127/56 | HR 67 | Temp 98.2°F | Ht 65.5 in | Wt 289.0 lb

## 2022-12-30 DIAGNOSIS — I10 Essential (primary) hypertension: Secondary | ICD-10-CM

## 2022-12-30 DIAGNOSIS — M109 Gout, unspecified: Secondary | ICD-10-CM | POA: Diagnosis not present

## 2022-12-30 DIAGNOSIS — M1A9XX0 Chronic gout, unspecified, without tophus (tophi): Secondary | ICD-10-CM

## 2022-12-30 DIAGNOSIS — E785 Hyperlipidemia, unspecified: Secondary | ICD-10-CM | POA: Diagnosis not present

## 2022-12-30 DIAGNOSIS — R7303 Prediabetes: Secondary | ICD-10-CM

## 2022-12-30 DIAGNOSIS — Z0000 Encounter for general adult medical examination without abnormal findings: Secondary | ICD-10-CM

## 2022-12-30 DIAGNOSIS — Z87891 Personal history of nicotine dependence: Secondary | ICD-10-CM

## 2022-12-30 LAB — POCT GLYCOSYLATED HEMOGLOBIN (HGB A1C): Hemoglobin A1C: 6.2 % — AB (ref 4.0–5.6)

## 2022-12-30 LAB — GLUCOSE, CAPILLARY: Glucose-Capillary: 94 mg/dL (ref 70–99)

## 2022-12-30 NOTE — Assessment & Plan Note (Signed)
-  Colonoscopy on 11/08/2022 normal aside from one diminutive sessile polyp in the DC which was removed (non-malignant on pathology). -Patient instructed to obtain Shingrix vaccine.

## 2022-12-30 NOTE — Progress Notes (Signed)
CC: Follow-up  HPI:  [name] is a 72 y.o. female living with a history stated below and presents today for a 83-month follow-up. Please see problem based assessment and plan for additional details.  Past Medical History:  Diagnosis Date   Acanthosis nigricans    Allergic rhinitis    Discomfort of right groin 03/14/2010   Qualifier: Diagnosis of   By: Cathey Endow MD, Elige Radon         Elevated LFTs    Resolved- etiology unclear hep B, Hep C serology neg 2005   Endometrial cancer (HCC) 07/2021   stage 1   Endometrial polyp    benign 12/06   Gastric ulcer 02/20/2013   Gastric ulcer with hemorrhage 02/20/2013   GERD (gastroesophageal reflux disease)    Hx of abnormal cervical Pap smear    1980 normal in 2006.    Hyperlipidemia    Hypertension    Iron deficiency anemia    postmenopausal bleeding followed by Dr. Okey Dupre.  Hx of benign endometrial polyp.    Lumbar back pain    Morbid (severe) obesity due to excess calories (HCC) 11/08/2022   bmi 47.36   Obesity    morbid obesity   Personal history of colonic polyps- serrated adenoma 05/07/2013   05/2013 - diminutive ssa repeat colon   11/11/2017 3 diminutive polyps removed 1 adenoma 2 benign mucosal   11/08/2022 1 diminutive polyp     Iva Boop, MD, [facility]      Pre-diabetes    Premenopausal menorrhagia    Right knee pain     Current Outpatient Medications on File Prior to Visit  Medication Sig Dispense Refill   Multiple Vitamins-Minerals (ALIVE WOMENS 50+ GUMMY) CHEW Chew 2 tablets by mouth daily.     Olmesartan-amLODIPine-HCTZ 40-10-12.5 MG TABS Take 1 tablet by mouth daily. 90 tablet 1   Polyethyl Glycol-Propyl Glycol (SYSTANE OP) Place 1 drop into both eyes 2 (two) times daily as needed (dry eyes).     rosuvastatin (CRESTOR) 20 MG tablet Take 1 tablet (20 mg total) by mouth daily. 90 tablet 1   Current Facility-Administered Medications on File Prior to Visit  Medication Dose Route Frequency Provider Last Rate Last  Admin   0.9 %  sodium chloride infusion  500 mL Intravenous Continuous Iva Boop, MD        Family History  Problem Relation Age of Onset   Hypertension Mother    Breast cancer Mother 43   Alcohol abuse Father    Hypertension Father    Heart disease Father    Gout Father    Breast cancer Sister        49s   Hypertension Sister    Hypertension Sister    Colon cancer Neg Hx    Ovarian cancer Neg Hx    Endometrial cancer Neg Hx    Pancreatic cancer Neg Hx    Prostate cancer Neg Hx    Colon polyps Neg Hx    Esophageal cancer Neg Hx    Rectal cancer Neg Hx    Stomach cancer Neg Hx     Social History   Socioeconomic History   Marital status: Single    Spouse name: Not on file   Number of children: 2   Years of education: Not on file   Highest education level: Not on file  Occupational History   Occupation: paralegal   Occupation: SECRETARY/RECEPTIONIST    Employer: [employer]   Occupation:  retired  Tobacco Use   Smoking status: Former    Current packs/day: 0.00    Types: Cigarettes    Start date: 07/19/1971    Quit date: 07/18/1981    Years since quitting: 41.4   Smokeless tobacco: Never  Vaping Use   Vaping status: Never Used  Substance and Sexual Activity   Alcohol use: No    Alcohol/week: 0.0 standard drinks of alcohol   Drug use: No   Sexual activity: Not Currently    Birth control/protection: Post-menopausal  Other Topics Concern   Not on file  Social History Narrative    The patient is a IT consultant, she is divorced and widowed , 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   Unemployed as of summer 2014, job eliminated   1 son one daughter   No caffeine   Updated as of 03/30/2013   Social Determinants of Health   Financial Resource Strain: Low Risk  (02/28/2022)   Overall Financial Resource Strain (CARDIA)    Difficulty of Paying Living Expenses: Not hard at all  Food Insecurity: No Food Insecurity (02/28/2022)   Hunger Vital Sign     Worried About Running Out of Food in the Last Year: Never true    Ran Out of Food in the Last Year: Never true  Transportation Needs: No Transportation Needs (02/28/2022)   PRAPARE - Administrator, Civil Service (Medical): No    Lack of Transportation (Non-Medical): No  Physical Activity: Inactive (02/28/2022)   Exercise Vital Sign    Days of Exercise per Week: 0 days    Minutes of Exercise per Session: 0 min  Stress: No Stress Concern Present (02/28/2022)   [employer] of Occupational Health - Occupational Stress Questionnaire    Feeling of Stress : Not at all  Social Connections: Moderately Isolated (02/28/2022)   Social Connection and Isolation Panel [NHANES]    Frequency of Communication with Friends and Family: Once a week    Frequency of Social Gatherings with Friends and Family: Never    Attends Religious Services: More than 4 times per year    Active Member of [employer] or Organizations: Yes    Attends Engineer, structural: More than 4 times per year    Marital Status: Divorced  Intimate Partner Violence: Not At Risk (02/28/2022)   Humiliation, Afraid, Rape, and Kick questionnaire    Fear of Current or Ex-Partner: No    Emotionally Abused: No    Physically Abused: No    Sexually Abused: No    Review of Systems: ROS negative except for what is noted on the assessment and plan.  Vitals:   12/30/22 1330 12/30/22 1431  BP: (!) 149/42 (!) 127/56  Pulse: 77 67  Temp: 98.2 F (36.8 C)   TempSrc: Oral   SpO2: 100%   Weight: 289 lb (131.1 kg)   Height: 5' 5.5" (1.664 m)     Physical Exam: Constitutional: sitting in chair, in no acute distress Cardiovascular: regular rate and rhythm, no m/r/g Pulmonary/Chest: normal work of breathing on room air, lungs clear to auscultation bilaterally Skin: warm and dry; left big toe mildly tender with mild swelling and darkening discoloration Neurological: alert & oriented x 3, no focal deficit Psych: normal mood  and behavior  Assessment & Plan:     Patient seen with Dr. Antony Contras  Essential hypertension Patient has history of hypertension and currently takes combination olmesartan-amlodipine-hydrochlorothiazide 40-10-12.5 mg/daily . In-office BP today was initially elevated at 149/42 but re-check was 127/56.  Home BP readings around 120-125/60's. -Continue olmesartan-amlodipine-hydrochlorothiazide 40-10-12.5 mg  Hyperlipidemia Patient has a history of hyperlipidemia. Patient's lipid profile from 08/12/2022 showed total cholesterol of 145; LDL of 71; HDL 59 Patient is compliant with Crestor 20 mg/daily and reports no side effects from medication. -Continue Crestor 20 mg/daily  Gout Patient has 5-year history of gout-like flares of left big toe that has never been formally diagnosed. Patient has had 3 flares total. Patient presented today with left big toe tenderness and swelling that had been on-going for approximately one week. Patient states the discomfort is resolving. Discussed association with gout and the use of hydrochlorothiazide, but patient hesitant to make medication changes due to the infrequency of these flares. -Patient informed to call if another flare occurs so that it can be further investigated. -Check BMP  Prediabetes Patient has a history of prediabetes. A1c today was 6.2 which improved from 6.5 (08/2022). Patient instructed on importance of diet and exercise. -Repeat A1c in 6 months.  Healthcare maintenance -Colonoscopy on 11/08/2022 normal aside from one diminutive sessile polyp in the DC which was removed (non-malignant on pathology). -Patient instructed to obtain Shingrix vaccine.   Carmina Miller, D.O. [facility] Internal Medicine, PGY-1 Phone: [phone] Date 12/30/2022 Time 7:25 PM

## 2022-12-30 NOTE — Assessment & Plan Note (Signed)
Patient has a history of prediabetes. A1c today was 6.2 which improved from 6.5 (08/2022). Patient instructed on importance of diet and exercise. -Repeat A1c in 6 months.

## 2022-12-30 NOTE — Patient Instructions (Signed)
Thank you, [name] for allowing [place] to provide your care today. Today we discussed the potential diagnosis of gout and its association with Hydrochlorothiazide which is one of the components of your blood pressure medication. As we discussed, I do not see a big need to change anything right now, but please call if you have another flare so we can further investigate. Please continue all medications as prescribed. I will let you know when the labs come back.  I have ordered the following labs for you:   Lab Orders         BMP8+Anion Gap         POC Hbg A1C         Follow up: 6 months      Should you have any questions or concerns please call the internal medicine clinic at [phone].    [name], D.O. [facility]

## 2022-12-30 NOTE — Assessment & Plan Note (Addendum)
Patient has history of hypertension and currently takes combination olmesartan-amlodipine-hydrochlorothiazide 40-10-12.5 mg/daily . In-office BP today was initially elevated at 149/42 but re-check was 127/56. Home BP readings around 120-125/60's. -Continue olmesartan-amlodipine-hydrochlorothiazide 40-10-12.5 mg

## 2022-12-30 NOTE — Assessment & Plan Note (Addendum)
Patient has a history of hyperlipidemia. Patient's lipid profile from 08/12/2022 showed total cholesterol of 145; LDL of 71; HDL 59 Patient is compliant with Crestor 20 mg/daily and reports no side effects from medication. -Continue Crestor 20 mg/daily

## 2022-12-30 NOTE — Assessment & Plan Note (Addendum)
Patient has 5-year history of gout-like flares of left big toe that has never been formally diagnosed. Patient has had 3 flares total. Patient presented today with left big toe tenderness and swelling that had been on-going for approximately one week. Patient states the discomfort is resolving. Discussed association with gout and the use of hydrochlorothiazide, but patient hesitant to make medication changes due to the infrequency of these flares. -Patient informed to call if another flare occurs so that it can be further investigated. -Check BMP

## 2022-12-31 NOTE — Progress Notes (Signed)
Patient's creatinine and GFR unremarkable in regards to gout-like symptoms.

## 2022-12-31 NOTE — Progress Notes (Signed)
Internal Medicine Clinic Attending  I was physically present during the key portions of the resident provided service and participated in the medical decision making of patient's management care. I reviewed pertinent patient test results.  The assessment, diagnosis, and plan were formulated together and I agree with the documentation in the resident's note.  Patient with history of gout however this has never been confirmed with arthrocentesis.. She is recovering from a recent flair but episodes are usually infrequent (<2 a year). She is taking hydrochlorothiazide in her combination antihypertensive. If she continues to have issues with gout, consider discontinuing hydrochlorothiazide and initiating allopurinol for uric acid lowering therapy. Would ideally like to confirm diagnosis with synovial fluid analysis first.   Reymundo Poll, MD

## 2023-01-09 ENCOUNTER — Telehealth: Payer: Self-pay | Admitting: Student

## 2023-01-09 NOTE — Telephone Encounter (Signed)
Patient needs to be scheduled for a Welcome to Medicare visit with PCP before 02/02/2023.  Thanks,  Randon Goldsmith Care Guide [facility] AWV TEAM Direct Dial: [phone]

## 2023-01-13 ENCOUNTER — Ambulatory Visit: Payer: 59 | Admitting: Gynecologic Oncology

## 2023-01-15 ENCOUNTER — Encounter: Payer: Self-pay | Admitting: Gynecologic Oncology

## 2023-01-20 ENCOUNTER — Inpatient Hospital Stay: Payer: 59 | Admitting: Gynecologic Oncology

## 2023-01-20 ENCOUNTER — Other Ambulatory Visit: Payer: Self-pay

## 2023-01-20 VITALS — BP 118/51 | HR 78 | Temp 98.6°F | Resp 18 | Ht 65.0 in | Wt 286.0 lb

## 2023-01-20 DIAGNOSIS — Z90710 Acquired absence of both cervix and uterus: Secondary | ICD-10-CM | POA: Diagnosis not present

## 2023-01-20 DIAGNOSIS — Z90722 Acquired absence of ovaries, bilateral: Secondary | ICD-10-CM | POA: Insufficient documentation

## 2023-01-20 DIAGNOSIS — R10812 Left upper quadrant abdominal tenderness: Secondary | ICD-10-CM | POA: Diagnosis not present

## 2023-01-20 DIAGNOSIS — Z8542 Personal history of malignant neoplasm of other parts of uterus: Secondary | ICD-10-CM | POA: Diagnosis present

## 2023-01-20 DIAGNOSIS — R1012 Left upper quadrant pain: Secondary | ICD-10-CM | POA: Insufficient documentation

## 2023-01-20 DIAGNOSIS — C541 Malignant neoplasm of endometrium: Secondary | ICD-10-CM

## 2023-01-20 NOTE — Patient Instructions (Addendum)
Good news! No concerning findings on today's exam. Plan to follow up in six months with Dr. Pricilla Holm or sooner if needed. Please call for any questions, concerns, or new symptoms.  Continue to monitor the left upper abdominal area. If this becomes tender, you can try ice, heat, or tylenol if you are able to take this. If the tenderness persists, please call the office.   You can apply a warm compress to the cyst on your right vulva to assist with drainage. Monitor this area and call for any changes.   Symptoms to report to your health care team include vaginal bleeding, rectal bleeding, bloating, weight loss without effort, new and persistent pain, new and  persistent fatigue, new leg swelling, new masses (i.e., bumps in your neck or groin), new and persistent cough, new and persistent nausea and vomiting, change in bowel or bladder habits, and any other concerns.

## 2023-01-20 NOTE — Progress Notes (Signed)
Gynecologic Oncology Return Clinic Visit  01/20/2023  Reason for Visit: Surveillance visit in the setting of endometrial cancer   Treatment History: Oncology History  Endometrial cancer, grade I (HCC)  05/30/2021 Initial Biopsy   Endometrial biopsy shows FIGO grade 1 endometrioid adenocarcinoma   06/06/2021 Initial Diagnosis   Endometrial cancer, grade I (HCC)   07/10/2021 Surgery   TRH/BSO, bilateral SLN bx, reduction of umbilical hernia  Findings:  On EUA, 10cm mobile uterus. On intra-abdominal entry, normal upper abdominal survey. Small amount of omentum within the umbilical hernia, easily reduced. Normal small and large bowel. Normal omentum. 12 cm bulbous uterus. Normal bilateral adnexa. Mapping successful to right common and left external iliac SLNs. No intra-abdominal or pelvic evidence of disease.    07/10/2021 Pathology Results   Stage IA, grade 1 endometrioid adenocarcinoma MI: 25% No LVI SLNs negative     Interval History: Patient presents today to the office for follow up. Overall, she reports doing well since her last visit. She has a new left upper abdominal tenderness with no pain in this area. This has been present for the past week. No pain with deep palpation, no recent injury, no new reflux symptoms. She has urinary frequency related to amount of water drank. No vaginal bleeding or discharge. She reports being constipated one month ago and has not had issue since. She had a recent colonoscopy. She had left toe edema and soreness which is improving and felt to be related to gout. She was also having left sciatica pain that is improving. No nausea or emesis and tolerating her diet without issues. No chest pain, dyspnea. No other symptoms voiced.  Past Medical/Surgical History: Past Medical History:  Diagnosis Date   Acanthosis nigricans    Allergic rhinitis    Discomfort of right groin 03/14/2010   Qualifier: Diagnosis of   By: Cathey Endow MD, Elige Radon         Elevated LFTs     Resolved- etiology unclear hep B, Hep C serology neg 2005   Endometrial cancer (HCC) 07/2021   stage 1   Endometrial polyp    benign 12/06   Gastric ulcer 02/20/2013   Gastric ulcer with hemorrhage 02/20/2013   GERD (gastroesophageal reflux disease)    Hx of abnormal cervical Pap smear    1980 normal in 2006.    Hyperlipidemia    Hypertension    Iron deficiency anemia    postmenopausal bleeding followed by Dr. Okey Dupre.  Hx of benign endometrial polyp.    Lumbar back pain    Morbid (severe) obesity due to excess calories (HCC) 11/08/2022   bmi 47.36   Obesity    morbid obesity   Personal history of colonic polyps- serrated adenoma 05/07/2013   05/2013 - diminutive ssa repeat colon   11/11/2017 3 diminutive polyps removed 1 adenoma 2 benign mucosal   11/08/2022 1 diminutive polyp     Iva Boop, MD, [facility]      Pre-diabetes    Premenopausal menorrhagia    Right knee pain     Past Surgical History:  Procedure Laterality Date   COLONOSCOPY     COLONOSCOPY N/A 05/05/2013   Procedure: COLONOSCOPY;  Surgeon: Iva Boop, MD;  Location: [facility] ENDOSCOPY;  Service: Endoscopy;  Laterality: N/A;   ESOPHAGOGASTRODUODENOSCOPY N/A 02/20/2013   Procedure: ESOPHAGOGASTRODUODENOSCOPY (EGD);  Surgeon: Rachael Fee, MD;  Location[facility] ENDOSCOPY;  Service: Endoscopy;  Laterality: N/A;   ESOPHAGOGASTRODUODENOSCOPY N/A 05/05/2013   Procedure: ESOPHAGOGASTRODUODENOSCOPY (EGD);  Surgeon: Maryjean Morn  Leone Payor, MD;  Location: [facility] ENDOSCOPY;  Service: Endoscopy;  Laterality: N/A;   ROBOTIC ASSISTED TOTAL HYSTERECTOMY WITH BILATERAL SALPINGO OOPHERECTOMY Bilateral 07/10/2021   Procedure: XI ROBOTIC ASSISTED TOTAL HYSTERECTOMY WITH BILATERAL SALPINGO OOPHORECTOMY;  Surgeon: Carver Fila, MD;  Location: [facility] ORS;  Service: Gynecology;  Laterality: Bilateral;   SENTINEL NODE BIOPSY N/A 07/10/2021   Procedure: SENTINEL NODE BIOPSY;  Surgeon: Carver Fila, MD;  Location: [facility] ORS;  Service: Gynecology;   Laterality: N/A;   THERAPEUTIC ABORTION  1975    Family History  Problem Relation Age of Onset   Hypertension Mother    Breast cancer Mother 18   Alcohol abuse Father    Hypertension Father    Heart disease Father    Gout Father    Breast cancer Sister        12s   Hypertension Sister    Hypertension Sister    Colon cancer Neg Hx    Ovarian cancer Neg Hx    Endometrial cancer Neg Hx    Pancreatic cancer Neg Hx    Prostate cancer Neg Hx    Colon polyps Neg Hx    Esophageal cancer Neg Hx    Rectal cancer Neg Hx    Stomach cancer Neg Hx     Social History   Socioeconomic History   Marital status: Single    Spouse name: Not on file   Number of children: 2   Years of education: Not on file   Highest education level: Not on file  Occupational History   Occupation: paralegal   Occupation: Oceanographer: [employer]   Occupation: retired  Tobacco Use   Smoking status: Former    Current packs/day: 0.00    Types: Cigarettes    Start date: 07/19/1971    Quit date: 07/18/1981    Years since quitting: 41.5   Smokeless tobacco: Never  Vaping Use   Vaping status: Never Used  Substance and Sexual Activity   Alcohol use: No    Alcohol/week: 0.0 standard drinks of alcohol   Drug use: No   Sexual activity: Not Currently    Birth control/protection: Post-menopausal  Other Topics Concern   Not on file  Social History Narrative    The patient is a IT consultant, she is divorced and widowed , 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   Unemployed as of summer 2014, job eliminated   1 son one daughter   No caffeine   Updated as of 03/30/2013   Social Determinants of Health   Financial Resource Strain: Low Risk  (02/28/2022)   Overall Financial Resource Strain (CARDIA)    Difficulty of Paying Living Expenses: Not hard at all  Food Insecurity: No Food Insecurity (02/28/2022)   Hunger Vital Sign    Worried About Running Out of Food in the Last  Year: Never true    Ran Out of Food in the Last Year: Never true  Transportation Needs: No Transportation Needs (02/28/2022)   PRAPARE - Administrator, Civil Service (Medical): No    Lack of Transportation (Non-Medical): No  Physical Activity: Inactive (02/28/2022)   Exercise Vital Sign    Days of Exercise per Week: 0 days    Minutes of Exercise per Session: 0 min  Stress: No Stress Concern Present (02/28/2022)   [employer] of Occupational Health - Occupational Stress Questionnaire    Feeling of Stress : Not at all  Social Connections: Moderately Isolated (02/28/2022)  Social Advertising account executive [NHANES]    Frequency of Communication with Friends and Family: Once a week    Frequency of Social Gatherings with Friends and Family: Never    Attends Religious Services: More than 4 times per year    Active Member of [employer] or Organizations: Yes    Attends Engineer, structural: More than 4 times per year    Marital Status: Divorced    Current Medications:  Current Outpatient Medications:    acetaminophen (TYLENOL) 650 MG CR tablet, Take 650 mg by mouth every 8 (eight) hours as needed for pain. Only when needed, Disp: , Rfl:    Multiple Vitamins-Minerals (ALIVE WOMENS 50+ GUMMY) CHEW, Chew 2 tablets by mouth daily., Disp: , Rfl:    Olmesartan-amLODIPine-HCTZ 40-10-12.5 MG TABS, Take 1 tablet by mouth daily., Disp: 90 tablet, Rfl: 1   Polyethyl Glycol-Propyl Glycol (SYSTANE OP), Place 1 drop into both eyes 2 (two) times daily as needed (dry eyes)., Disp: , Rfl:    rosuvastatin (CRESTOR) 20 MG tablet, Take 1 tablet (20 mg total) by mouth daily., Disp: 90 tablet, Rfl: 1  Current Facility-Administered Medications:    0.9 %  sodium chloride infusion, 500 mL, Intravenous, Continuous, Iva Boop, MD  Review of Systems: + for urinary freq related to amount of fluid intake, +left upper abdominal tenderness x 1 week. No fever, chills. Additional review is  negative.  Physical Exam: BP (!) 118/51 (BP Location: Left Arm, Patient Position: Sitting)   Pulse 78   Temp 98.6 F (37 C) (Oral)   Resp 18   Ht 5\' 5"  (1.651 m)   Wt 286 lb (129.7 kg)   SpO2 99%   BMI 47.59 kg/m  General: Alert, oriented, no acute distress. HEENT: Normocephalic, atraumatic, sclera anicteric. Chest: Clear to auscultation bilaterally.  No wheezes or rhonchi. Cardiovascular: Regular rate and rhythm, no murmurs. Abdomen: Obese, soft, nontender.  Normoactive bowel sounds.  No masses or hepatosplenomegaly appreciated.  Well-healed incisions. No increased tenderness with palpation of the LUQ. No palpable masses but limited due to habitus. No skin changes in this area.  Extremities: Grossly normal range of motion.  Warm, well perfused.  No edema bilaterally. Skin: No rashes or lesions noted. Lymphatics: No cervical, supraclavicular, or inguinal adenopathy. GU: Normal appearing external genitalia without erythema, excoriation, or lesions. 0.5 cm well circumscribed circular sebaceous cyst-like area noted on the right labia majora. No surrounding erythema, nodularity, or increased tenderness with palpation (per pt, this is unchanged and has been present).  Speculum exam reveals well rugated vaginal mucosa.  Some redundancy of the vaginal walls.  No masses noted.  No bleeding or discharge.  Bimanual exam reveals cuff intact, no nodularity or masses.  Rectovaginal exam confirms these findings.  Laboratory & Radiologic Studies: None new  Assessment & Plan: MANDOLYN MCTIGUE is a 72 y.o. woman with Stage IA2 grade 1 endometrioid endometrial adenocarcinoma who presents for surveillance. Surgery in 07/2021. MMR IHC intact.  MS-stable.   Patient is overall doing well.  NED on exam today. Interventions discussed regarding new onset left upper quadrant abdominal tenderness. She is advised to call back to the office if things do not improve or if her symptoms persist/worsen after measures.  Warm compresses to the right vulva discussed as well to assist with drainage of the cyst.   Per NCCN surveillance recommendations, we will continue surveillance visits every 6 months. Her next visit to be with Dr. Eugene Garnet in 6 months and myself  in 1 year.  We reviewed signs and symptoms that would be concerning for cancer recurrence and I stressed the importance of calling if she develops any of these between visits.  20 minutes of total time was spent for this patient encounter, including preparation, face-to-face counseling with the patient and coordination of care, and documentation of the encounter.  Warner Mccreedy NP [facility]

## 2023-01-21 ENCOUNTER — Telehealth: Payer: Self-pay | Admitting: *Deleted

## 2023-01-21 NOTE — Telephone Encounter (Signed)
-----   Message from Doylene Bode sent at 01/21/2023  1:10 PM EDT ----- I saw this patient yesterday. She has having LUQ abd tenderness but no pain. Please have her try ice and or heat to this area along with tylenol to see if this helps. If by Friday, no relief, we can start with an abdominal ultrasound to further evaluate the area.

## 2023-01-21 NOTE — Telephone Encounter (Signed)
Spoke with [name]. Pt states she is still having the tenderness to her left upper abdomin and she thinks it feels like her ribs. She is not entirely sure and states, "It's not often" and started about a week ago. Message relayed to patient from Warner Mccreedy, NP  to try ice and or heat to the area along with tylenol to see if this helps. If by Friday, no relief, we can start with an abdominal ultrasound to further evaluate the area. Pt verbalized understanding, states she has heating pad and tylenol at home. Advised patient the office would reach out on Friday. Pt thanked the office for calling.

## 2023-01-24 DIAGNOSIS — R10812 Left upper quadrant abdominal tenderness: Secondary | ICD-10-CM | POA: Insufficient documentation

## 2023-01-24 NOTE — Telephone Encounter (Signed)
Follow up call to get an update on left upper abdomen tenderness. LVM for pt to call office back

## 2023-01-24 NOTE — Telephone Encounter (Signed)
[name] called back stating she is feeling better. The Ice/heat helps a lot, also taking Tylenol. She states the pain usually happens at night. She will watch it over the weekend. Notified her we will call her back on Monday to see how she is doing and we may order an ultrasound.  She states an understanding.   Warner Mccreedy APP notified

## 2023-01-28 NOTE — Telephone Encounter (Signed)
Spoke with [name] this morning she states, "I am feeling good today, and the pain is gone"  Pt states she is no longer needing tylenol at this time for pain. She is having regular bowel movements without straining and is passing gas. Pt reports that the heating pad worked well and denies pain when she coughs as well, so she is not sure what it was. Pt advised to call the office if the pain returns or with any concerns or questions. Pt verbalized understanding.

## 2023-03-24 ENCOUNTER — Other Ambulatory Visit: Payer: Self-pay

## 2023-03-24 DIAGNOSIS — I10 Essential (primary) hypertension: Secondary | ICD-10-CM

## 2023-03-25 MED ORDER — OLMESARTAN-AMLODIPINE-HCTZ 40-10-12.5 MG PO TABS
1.0000 | ORAL_TABLET | Freq: Every day | ORAL | 1 refills | Status: DC
Start: 2023-03-25 — End: 2023-06-06

## 2023-04-22 ENCOUNTER — Telehealth: Payer: Self-pay

## 2023-04-22 NOTE — Telephone Encounter (Signed)
Ms. Oeser called stating she was seen by Warner Mccreedy NP in August and was told to call if any concerns.   This morning she had a small amount of bleeding with a BM. It was just on toilet tissue and not in the toilet. Reports bleeding is not constant, no pain or cramping anywhere, and no constipation (had 2 normal BM's yesterday) Reports no straining, or lifting anything heavy prior to bleeding and no rectal pressure or pain. She does not notice any outside hemorrhoids, but does use Preparation H rectal wipes if she has more than 1 BM a day. Reports no fever/chills, eating fine.  She was concerned since this has not happened before.   Aware I will send Warner Mccreedy NP this message and will call her back with advice.

## 2023-04-23 NOTE — Telephone Encounter (Signed)
LVM for patient to call office for an update on message from yesterday.

## 2023-04-23 NOTE — Telephone Encounter (Signed)
[name] called stating she has not had anymore bleeding with bowel movements since the one time yesterday morning. She will monitor and call the office if she notices it again or has other concerns.   Warner Mccreedy NP notified.

## 2023-05-06 ENCOUNTER — Telehealth: Payer: Self-pay | Admitting: *Deleted

## 2023-05-06 NOTE — Telephone Encounter (Signed)
Per provider moved appt from 2/20 to 2/13, patient aware

## 2023-06-06 ENCOUNTER — Ambulatory Visit (INDEPENDENT_AMBULATORY_CARE_PROVIDER_SITE_OTHER): Payer: Medicare Other | Admitting: Student

## 2023-06-06 VITALS — BP 122/57 | HR 77 | Temp 98.1°F | Ht 65.0 in | Wt 295.2 lb

## 2023-06-06 DIAGNOSIS — I10 Essential (primary) hypertension: Secondary | ICD-10-CM

## 2023-06-06 DIAGNOSIS — E785 Hyperlipidemia, unspecified: Secondary | ICD-10-CM

## 2023-06-06 DIAGNOSIS — Z23 Encounter for immunization: Secondary | ICD-10-CM | POA: Diagnosis not present

## 2023-06-06 DIAGNOSIS — E7800 Pure hypercholesterolemia, unspecified: Secondary | ICD-10-CM

## 2023-06-06 DIAGNOSIS — Z0000 Encounter for general adult medical examination without abnormal findings: Secondary | ICD-10-CM

## 2023-06-06 DIAGNOSIS — R7303 Prediabetes: Secondary | ICD-10-CM

## 2023-06-06 DIAGNOSIS — Z6841 Body Mass Index (BMI) 40.0 and over, adult: Secondary | ICD-10-CM

## 2023-06-06 LAB — POCT GLYCOSYLATED HEMOGLOBIN (HGB A1C): Hemoglobin A1C: 6.2 % — AB (ref 4.0–5.6)

## 2023-06-06 LAB — GLUCOSE, CAPILLARY: Glucose-Capillary: 131 mg/dL — ABNORMAL HIGH (ref 70–99)

## 2023-06-06 MED ORDER — OLMESARTAN-AMLODIPINE-HCTZ 40-10-12.5 MG PO TABS
1.0000 | ORAL_TABLET | Freq: Every day | ORAL | 3 refills | Status: DC
Start: 2023-06-06 — End: 2023-12-29

## 2023-06-06 MED ORDER — ROSUVASTATIN CALCIUM 20 MG PO TABS: 20.0000 mg | ORAL_TABLET | Freq: Every day | ORAL | 3 refills | Status: DC

## 2023-06-06 NOTE — Assessment & Plan Note (Addendum)
 Received Flu vaccine today. Will consider Shingrix.

## 2023-06-06 NOTE — Progress Notes (Signed)
 CC: Follow-up  HPI:  [name] is a 73 y.o. female living with a history stated below and presents today for follow-up. Please see problem based assessment and plan for additional details.  Past Medical History:  Diagnosis Date   Acanthosis nigricans    Allergic rhinitis    Discomfort of right groin 03/14/2010   Qualifier: Diagnosis of   By: Waylan MD, Adine         Elevated LFTs    Resolved- etiology unclear hep B, Hep C serology neg 2005   Endometrial cancer (HCC) 07/2021   stage 1   Endometrial polyp    benign 12/06   Gastric ulcer 02/20/2013   Gastric ulcer with hemorrhage 02/20/2013   GERD (gastroesophageal reflux disease)    Hx of abnormal cervical Pap smear    1980 normal in 2006.    Hyperlipidemia    Hypertension    Iron deficiency anemia    postmenopausal bleeding followed by Dr. Rumalda.  Hx of benign endometrial polyp.    Lumbar back pain    Morbid (severe) obesity due to excess calories (HCC) 11/08/2022   bmi 47.36   Obesity    morbid obesity   Personal history of colonic polyps- serrated adenoma 05/07/2013   05/2013 - diminutive ssa repeat colon   11/11/2017 3 diminutive polyps removed 1 adenoma 2 benign mucosal   11/08/2022 1 diminutive polyp     [name], MD, [facility]      Pre-diabetes    Premenopausal menorrhagia    Right knee pain     Current Outpatient Medications on File Prior to Visit  Medication Sig Dispense Refill   acetaminophen  (TYLENOL ) 650 MG CR tablet Take 650 mg by mouth every 8 (eight) hours as needed for pain. Only when needed     Multiple Vitamins-Minerals (ALIVE WOMENS 50+ GUMMY) CHEW Chew 2 tablets by mouth daily.     Polyethyl Glycol-Propyl Glycol (SYSTANE OP) Place 1 drop into both eyes 2 (two) times daily as needed (dry eyes).     Current Facility-Administered Medications on File Prior to Visit  Medication Dose Route Frequency Provider Last Rate Last Admin   0.9 %  sodium chloride  infusion  500 mL Intravenous Continuous  Commander [name] BRAVO, MD        Family History  Problem Relation Age of Onset   Hypertension Mother    Breast cancer Mother 94   Alcohol abuse Father    Hypertension Father    Heart disease Father    Gout Father    Breast cancer Sister        10s   Hypertension Sister    Hypertension Sister    Colon cancer Neg Hx    Ovarian cancer Neg Hx    Endometrial cancer Neg Hx    Pancreatic cancer Neg Hx    Prostate cancer Neg Hx    Colon polyps Neg Hx    Esophageal cancer Neg Hx    Rectal cancer Neg Hx    Stomach cancer Neg Hx     Social History   Socioeconomic History   Marital status: Single    Spouse name: Not on file   Number of children: 2   Years of education: Not on file   Highest education level: Not on file  Occupational History   Occupation: paralegal   Occupation: SECRETARY/RECEPTIONIST    Employer: [employer]   Occupation: retired  Tobacco Use   Smoking status: Former  Current packs/day: 0.00    Types: Cigarettes    Start date: 07/19/1971    Quit date: 07/18/1981    Years since quitting: 41.9   Smokeless tobacco: Never  Vaping Use   Vaping status: Never Used  Substance and Sexual Activity   Alcohol use: No    Alcohol/week: 0.0 standard drinks of alcohol   Drug use: No   Sexual activity: Not Currently    Birth control/protection: Post-menopausal  Other Topics Concern   Not on file  Social History Narrative    The patient is a it consultant, she is divorced and widowed , 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   Unemployed as of summer 2014, job eliminated   1 son one daughter   No caffeine   Updated as of 03/30/2013   Social Drivers of Health   Financial Resource Strain: Low Risk  (02/28/2022)   Overall Financial Resource Strain (CARDIA)    Difficulty of Paying Living Expenses: Not hard at all  Food Insecurity: No Food Insecurity (02/28/2022)   Hunger Vital Sign    Worried About Running Out of Food in the Last Year: Never true    Ran Out  of Food in the Last Year: Never true  Transportation Needs: No Transportation Needs (02/28/2022)   PRAPARE - Administrator, Civil Service (Medical): No    Lack of Transportation (Non-Medical): No  Physical Activity: Inactive (02/28/2022)   Exercise Vital Sign    Days of Exercise per Week: 0 days    Minutes of Exercise per Session: 0 min  Stress: No Stress Concern Present (02/28/2022)   [employer] of Occupational Health - Occupational Stress Questionnaire    Feeling of Stress : Not at all  Social Connections: Moderately Isolated (02/28/2022)   Social Connection and Isolation Panel [NHANES]    Frequency of Communication with Friends and Family: Once a week    Frequency of Social Gatherings with Friends and Family: Never    Attends Religious Services: More than 4 times per year    Active Member of [employer] or Organizations: Yes    Attends Engineer, Structural: More than 4 times per year    Marital Status: Divorced  Intimate Partner Violence: Not At Risk (02/28/2022)   Humiliation, Afraid, Rape, and Kick questionnaire    Fear of Current or Ex-Partner: No    Emotionally Abused: No    Physically Abused: No    Sexually Abused: No    Review of Systems: ROS negative except for what is noted on the assessment and plan.  Vitals:   06/06/23 0835  BP: (!) 122/57  Pulse: 77  Temp: 98.1 F (36.7 C)  TempSrc: Oral  SpO2: 94%  Weight: 295 lb 3.2 oz (133.9 kg)  Height: 5' 5 (1.651 m)    Physical Exam: Constitutional: well-appearing, sitting in chair, in no acute distress Cardiovascular: regular rate and rhythm, no m/r/g, no LEE Pulmonary/Chest: normal work of breathing on room air, lungs clear to auscultation bilaterally Abdominal: soft, non-tender, non-distended MSK: normal bulk and tone Skin: warm and dry Psych: normal mood and behavior  Assessment & Plan:     Patient discussed with Dr. Forest  Essential hypertension BP today was 122/57. Home BP  readings consistently wnl. -Continue Olmesartan -Amlodipine -Hydrochlorothiazide  40-10-12.5 mg  Prediabetes A1c today is 6.2 unchanged from 12/2022. Patient instructed on importance of diet and exercise (see morbid obesity). -Repeat A1c at f/u  Healthcare maintenance Received Flu vaccine today. Will consider Shingrix.  Hyperlipidemia Refilled Crestory  Morbid  obesity (HCC) BMI = 49. Patient walks 1/week < 0.5 miles. Fast food 3/week mostly on weekends. Spoke with patient in length about lifestyle modifications. She is motivated to make both dietary and exercise improvements.     Norman Lobstein, D.O. [facility], PGY-1 Phone: [phone] Date 06/06/2023 Time 9:27 AM

## 2023-06-06 NOTE — Assessment & Plan Note (Addendum)
 Refilled Crestory

## 2023-06-06 NOTE — Assessment & Plan Note (Addendum)
 BP today was 122/57. Home BP readings consistently wnl. -Continue Olmesartan-Amlodipine-Hydrochlorothiazide 40-10-12.5 mg

## 2023-06-06 NOTE — Assessment & Plan Note (Addendum)
 BMI = 49. Patient walks 1/week < 0.5 miles. Fast food 3/week mostly on weekends. Spoke with patient in length about lifestyle modifications. She is motivated to make both dietary and exercise improvements.

## 2023-06-06 NOTE — Assessment & Plan Note (Addendum)
 A1c today is 6.2 unchanged from 12/2022. Patient instructed on importance of diet and exercise (see morbid obesity). -Repeat A1c at f/u

## 2023-06-09 NOTE — Addendum Note (Signed)
 Addended by: Reymundo Poll R on: 06/09/2023 11:00 AM   Modules accepted: Level of Service

## 2023-06-09 NOTE — Progress Notes (Signed)
 Internal Medicine Clinic Attending  Case discussed with the resident at the time of the visit.  We reviewed the resident's history and exam and pertinent patient test results.  I agree with the assessment, diagnosis, and plan of care documented in the resident's note.

## 2023-07-17 ENCOUNTER — Inpatient Hospital Stay: Payer: 59 | Attending: Gynecologic Oncology | Admitting: Gynecologic Oncology

## 2023-07-17 ENCOUNTER — Encounter: Payer: Self-pay | Admitting: Gynecologic Oncology

## 2023-07-17 VITALS — BP 133/71 | HR 86 | Temp 98.7°F | Resp 19 | Wt 288.0 lb

## 2023-07-17 DIAGNOSIS — N9489 Other specified conditions associated with female genital organs and menstrual cycle: Secondary | ICD-10-CM | POA: Diagnosis not present

## 2023-07-17 DIAGNOSIS — Z90722 Acquired absence of ovaries, bilateral: Secondary | ICD-10-CM | POA: Insufficient documentation

## 2023-07-17 DIAGNOSIS — Z8542 Personal history of malignant neoplasm of other parts of uterus: Secondary | ICD-10-CM | POA: Insufficient documentation

## 2023-07-17 DIAGNOSIS — Z9079 Acquired absence of other genital organ(s): Secondary | ICD-10-CM | POA: Insufficient documentation

## 2023-07-17 DIAGNOSIS — Z90710 Acquired absence of both cervix and uterus: Secondary | ICD-10-CM | POA: Diagnosis not present

## 2023-07-17 DIAGNOSIS — C541 Malignant neoplasm of endometrium: Secondary | ICD-10-CM

## 2023-07-17 NOTE — Patient Instructions (Signed)
It was good to see you today.  I do not see or feel any evidence of cancer recurrence on your exam.  We will see you back for follow-up in 6 months.  As always, if you develop any new and concerning symptoms before your next visit, please call to see me sooner.

## 2023-07-17 NOTE — Progress Notes (Signed)
Gynecologic Oncology Return Clinic Visit  07/17/23  Reason for Visit: surveillance  Treatment History: Oncology History  Endometrial cancer, grade I (HCC)  05/30/2021 Initial Biopsy   Endometrial biopsy shows FIGO grade 1 endometrioid adenocarcinoma   06/06/2021 Initial Diagnosis   Endometrial cancer, grade I (HCC)   07/10/2021 Surgery   TRH/BSO, bilateral SLN bx, reduction of umbilical hernia  Findings:  On EUA, 10cm mobile uterus. On intra-abdominal entry, normal upper abdominal survey. Small amount of omentum within the umbilical hernia, easily reduced. Normal small and large bowel. Normal omentum. 12 cm bulbous uterus. Normal bilateral adnexa. Mapping successful to right common and left external iliac SLNs. No intra-abdominal or pelvic evidence of disease.    07/10/2021 Pathology Results   Stage IA, grade 1 endometrioid adenocarcinoma MI: 25% No LVI SLNs negative    Interval History: Doing well.  Denies vaginal bleeding or discharge.  Reports baseline bowel bladder function.  Notes that the left upper abdominal/flank pain that she was feeling after surgery continues to improve.  Past Medical/Surgical History: Past Medical History:  Diagnosis Date   Acanthosis nigricans    Allergic rhinitis    Discomfort of right groin 03/14/2010   Qualifier: Diagnosis of   By: Cathey Endow MD, Elige Radon         Elevated LFTs    Resolved- etiology unclear hep B, Hep C serology neg 2005   Endometrial cancer (HCC) 07/2021   stage 1   Endometrial polyp    benign 12/06   Gastric ulcer 02/20/2013   Gastric ulcer with hemorrhage 02/20/2013   GERD (gastroesophageal reflux disease)    Hx of abnormal cervical Pap smear    1980 normal in 2006.    Hyperlipidemia    Hypertension    Iron deficiency anemia    postmenopausal bleeding followed by Dr. Okey Dupre.  Hx of benign endometrial polyp.    Lumbar back pain    Morbid (severe) obesity due to excess calories (HCC) 11/08/2022   bmi 47.36   Obesity    morbid  obesity   Personal history of colonic polyps- serrated adenoma 05/07/2013   05/2013 - diminutive ssa repeat colon   11/11/2017 3 diminutive polyps removed 1 adenoma 2 benign mucosal   11/08/2022 1 diminutive polyp     Iva Boop, MD, [facility]      Pre-diabetes    Premenopausal menorrhagia    Right knee pain     Past Surgical History:  Procedure Laterality Date   COLONOSCOPY     COLONOSCOPY N/A 05/05/2013   Procedure: COLONOSCOPY;  Surgeon: Iva Boop, MD;  Location: [facility] ENDOSCOPY;  Service: Endoscopy;  Laterality: N/A;   ESOPHAGOGASTRODUODENOSCOPY N/A 02/20/2013   Procedure: ESOPHAGOGASTRODUODENOSCOPY (EGD);  Surgeon: Rachael Fee, MD;  Location[facility]COPY;  Service: Endoscopy;  Laterality: N/A;   ESOPHAGOGASTRODUODENOSCOPY N/A 05/05/2013   Procedure: ESOPHAGOGASTRODUODENOSCOPY (EGD);  Surgeon: Iva Boop, MD;  Location: [facility];  Service: Endoscopy;  Laterality: N/A;   ROBOTIC ASSISTED TOTAL HYSTERECTOMY WITH BILATERAL SALPINGO OOPHERECTOMY Bilateral 07/10/2021   Procedure: XI ROBOTIC ASSISTED TOTAL HYSTERECTOMY WITH BILATERAL SALPINGO OOPHORECTOMY;  Surgeon: Carver Fila, MD;  Location: [facility] ORS;  Service: Gynecology;  Laterality: Bilateral;   SENTINEL NODE BIOPSY N/A 07/10/2021   Procedure: SENTINEL NODE BIOPSY;  Surgeon: Carver Fila, MD;  Location: [facility] ORS;  Service: Gynecology;  Laterality: N/A;   THERAPEUTIC ABORTION  1975    Family History  Problem Relation Age of Onset   Hypertension Mother    Breast cancer Mother  80   Alcohol abuse Father    Hypertension Father    Heart disease Father    Gout Father    Breast cancer Sister        26s   Hypertension Sister    Hypertension Sister    Colon cancer Neg Hx    Ovarian cancer Neg Hx    Endometrial cancer Neg Hx    Pancreatic cancer Neg Hx    Prostate cancer Neg Hx    Colon polyps Neg Hx    Esophageal cancer Neg Hx    Rectal cancer Neg Hx    Stomach cancer Neg Hx     Social History   Socioeconomic  History   Marital status: Single    Spouse name: Not on file   Number of children: 2   Years of education: Not on file   Highest education level: Not on file  Occupational History   Occupation: paralegal   Occupation: Oceanographer: [employer]   Occupation: retired  Tobacco Use   Smoking status: Former    Current packs/day: 0.00    Types: Cigarettes    Start date: 07/19/1971    Quit date: 07/18/1981    Years since quitting: 42.0   Smokeless tobacco: Never  Vaping Use   Vaping status: Never Used  Substance and Sexual Activity   Alcohol use: No    Alcohol/week: 0.0 standard drinks of alcohol   Drug use: No   Sexual activity: Not Currently    Birth control/protection: Post-menopausal  Other Topics Concern   Not on file  Social History Narrative    The patient is a IT consultant, she is divorced and widowed , 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   Unemployed as of summer 2014, job eliminated   1 son one daughter   No caffeine   Updated as of 03/30/2013   Social Drivers of Health   Financial Resource Strain: Low Risk  (02/28/2022)   Overall Financial Resource Strain (CARDIA)    Difficulty of Paying Living Expenses: Not hard at all  Food Insecurity: No Food Insecurity (02/28/2022)   Hunger Vital Sign    Worried About Running Out of Food in the Last Year: Never true    Ran Out of Food in the Last Year: Never true  Transportation Needs: No Transportation Needs (02/28/2022)   PRAPARE - Administrator, Civil Service (Medical): No    Lack of Transportation (Non-Medical): No  Physical Activity: Inactive (02/28/2022)   Exercise Vital Sign    Days of Exercise per Week: 0 days    Minutes of Exercise per Session: 0 min  Stress: No Stress Concern Present (02/28/2022)   [employer] of Occupational Health - Occupational Stress Questionnaire    Feeling of Stress : Not at all  Social Connections: Moderately Isolated (02/28/2022)    Social Connection and Isolation Panel [NHANES]    Frequency of Communication with Friends and Family: Once a week    Frequency of Social Gatherings with Friends and Family: Never    Attends Religious Services: More than 4 times per year    Active Member of [employer] or Organizations: Yes    Attends Engineer, structural: More than 4 times per year    Marital Status: Divorced    Current Medications:  Current Outpatient Medications:    acetaminophen (TYLENOL) 650 MG CR tablet, Take 650 mg by mouth every 8 (eight) hours as needed for pain. Only when needed,  Disp: , Rfl:    Multiple Vitamins-Minerals (ALIVE WOMENS 50+ GUMMY) CHEW, Chew 2 tablets by mouth daily., Disp: , Rfl:    Olmesartan-amLODIPine-HCTZ 40-10-12.5 MG TABS, Take 1 tablet by mouth daily., Disp: 90 tablet, Rfl: 3   Polyethyl Glycol-Propyl Glycol (SYSTANE OP), Place 1 drop into both eyes 2 (two) times daily as needed (dry eyes)., Disp: , Rfl:    rosuvastatin (CRESTOR) 20 MG tablet, Take 1 tablet (20 mg total) by mouth daily., Disp: 90 tablet, Rfl: 3  Current Facility-Administered Medications:    0.9 %  sodium chloride infusion, 500 mL, Intravenous, Continuous, Iva Boop, MD  Review of Systems: Denies appetite changes, fevers, chills, fatigue, unexplained weight changes. Denies hearing loss, neck lumps or masses, mouth sores, ringing in ears or voice changes. Denies cough or wheezing.  Denies shortness of breath. Denies chest pain or palpitations. Denies leg swelling. Denies abdominal distention, pain, blood in stools, constipation, diarrhea, nausea, vomiting, or early satiety. Denies pain with intercourse, dysuria, frequency, hematuria or incontinence. Denies hot flashes, pelvic pain, vaginal bleeding or vaginal discharge.   Denies joint pain, back pain or muscle pain/cramps. Denies itching, rash, or wounds. Denies dizziness, headaches, numbness or seizures. Denies swollen lymph nodes or glands, denies easy  bruising or bleeding. Denies anxiety, depression, confusion, or decreased concentration.  Physical Exam: BP 133/71 (BP Location: Right Arm, Patient Position: Sitting)   Pulse 86   Temp 98.7 F (37.1 C) (Oral)   Resp 19   Wt 288 lb (130.6 kg)   SpO2 98%   BMI 47.93 kg/m  General: Alert, oriented, no acute distress. HEENT: Normocephalic, atraumatic, sclera anicteric. Chest: Clear to auscultation bilaterally.  No wheezes or rhonchi. Cardiovascular: Regular rate and rhythm, no murmurs. Abdomen: Obese, soft, nontender.  Normoactive bowel sounds.  No masses or hepatosplenomegaly appreciated.  Well-healed incisions. No increased tenderness with palpation of the LUQ. No palpable masses but limited due to habitus. No skin changes in this area.  Extremities: Grossly normal range of motion.  Warm, well perfused.  No edema bilaterally. Skin: No rashes or lesions noted. Lymphatics: No cervical, supraclavicular, or inguinal adenopathy. GU: Normal appearing external genitalia without erythema, excoriation.  Small, less than 1 cm smooth, round lesion along the right inferior labia, nontender.  Speculum exam reveals well rugated vaginal mucosa.  Some redundancy of the vaginal walls.  No masses noted.  No bleeding or discharge.  Bimanual exam reveals cuff intact, no nodularity or masses.  Rectovaginal exam confirms these findings.  Laboratory & Radiologic Studies: None new  Assessment & Plan: [name] is a 73 y.o. woman with Stage IA2 grade 1 endometrioid endometrial adenocarcinoma who presents for surveillance. Surgery in 07/2021. MMR IHC intact.  MS-stable.   Patient is overall doing well.  NED on exam today.   Suspected sebaceous cyst along the right labia, smooth.   Offered support for multiple recent deaths of family and church members.    Per NCCN surveillance recommendations, we will continue surveillance visits every 6 months. Her next visit to be with Melissa in 6 months and myself  in 1 year.  We reviewed signs and symptoms that would be concerning for cancer recurrence and I stressed the importance of calling if she develops any of these between visits.  20 minutes of total time was spent for this patient encounter, including preparation, face-to-face counseling with the patient and coordination of care, and documentation of the encounter.  Eugene Garnet, MD  Division of Gynecologic Oncology  Department  of Obstetrics and Gynecology  [facility]

## 2023-07-24 ENCOUNTER — Ambulatory Visit: Payer: 59 | Admitting: Gynecologic Oncology

## 2023-08-12 ENCOUNTER — Telehealth: Payer: Self-pay

## 2023-08-12 NOTE — Telephone Encounter (Signed)
 Per Warner Mccreedy NP, appointment moved from 01/13/24 to 01/20/24 @ 1:00pm. Pt agreed to changed date/time

## 2023-09-23 DIAGNOSIS — H524 Presbyopia: Secondary | ICD-10-CM | POA: Diagnosis not present

## 2023-09-23 DIAGNOSIS — R7303 Prediabetes: Secondary | ICD-10-CM | POA: Diagnosis not present

## 2023-09-23 DIAGNOSIS — H2513 Age-related nuclear cataract, bilateral: Secondary | ICD-10-CM | POA: Diagnosis not present

## 2023-09-23 LAB — HM DIABETES EYE EXAM

## 2023-11-03 ENCOUNTER — Telehealth: Payer: Self-pay

## 2023-11-03 NOTE — Telephone Encounter (Signed)
 Unable to reach pt to sch appt. VM is not setup.

## 2023-11-03 NOTE — Telephone Encounter (Signed)
 Copied from CRM [id number]. Topic: Appointments - Scheduling Inquiry for Clinic >> Oct 31, 2023  9:22 AM Tisa Forester wrote: Reason for CRM: patient would like to know if [name] have any availablity in June for or July 1st  3 month followup ,  on 10/30/23 her BP 150/120 also rash is healing still itch a little on her stomach  The first available was showing end of July  call back number [phone]

## 2023-11-11 ENCOUNTER — Other Ambulatory Visit: Payer: Self-pay | Admitting: Student

## 2023-11-11 DIAGNOSIS — Z1231 Encounter for screening mammogram for malignant neoplasm of breast: Secondary | ICD-10-CM

## 2023-12-29 ENCOUNTER — Ambulatory Visit: Payer: Self-pay | Admitting: Student

## 2023-12-29 VITALS — BP 128/81 | HR 80 | Temp 98.4°F | Ht 65.0 in | Wt 303.4 lb

## 2023-12-29 DIAGNOSIS — D649 Anemia, unspecified: Secondary | ICD-10-CM

## 2023-12-29 DIAGNOSIS — Z862 Personal history of diseases of the blood and blood-forming organs and certain disorders involving the immune mechanism: Secondary | ICD-10-CM

## 2023-12-29 DIAGNOSIS — R7303 Prediabetes: Secondary | ICD-10-CM

## 2023-12-29 DIAGNOSIS — I10 Essential (primary) hypertension: Secondary | ICD-10-CM | POA: Diagnosis not present

## 2023-12-29 DIAGNOSIS — E785 Hyperlipidemia, unspecified: Secondary | ICD-10-CM

## 2023-12-29 DIAGNOSIS — Z6841 Body Mass Index (BMI) 40.0 and over, adult: Secondary | ICD-10-CM

## 2023-12-29 DIAGNOSIS — Z0000 Encounter for general adult medical examination without abnormal findings: Secondary | ICD-10-CM

## 2023-12-29 MED ORDER — OLMESARTAN-AMLODIPINE-HCTZ 40-10-12.5 MG PO TABS: 1.0000 | ORAL_TABLET | Freq: Every day | ORAL | 3 refills | Status: DC

## 2023-12-29 NOTE — Assessment & Plan Note (Signed)
 Check A1c today.

## 2023-12-29 NOTE — Progress Notes (Signed)
 CC: Follow-up  HPI:  [name] is a 73 y.o. female living with a history stated below and presents today for follow-up. Please see problem based assessment and plan for additional details.  Past Medical History:  Diagnosis Date   Acanthosis nigricans    Allergic rhinitis    Discomfort of right groin 03/14/2010   Qualifier: Diagnosis of   By: Waylan MD, Adine         Elevated LFTs    Resolved- etiology unclear hep B, Hep C serology neg 2005   Endometrial cancer (HCC) 07/2021   stage 1   Endometrial polyp    benign 12/06   Gastric ulcer 02/20/2013   Gastric ulcer with hemorrhage 02/20/2013   GERD (gastroesophageal reflux disease)    Hx of abnormal cervical Pap smear    1980 normal in 2006.    Hyperlipidemia    Hypertension    Iron deficiency anemia    postmenopausal bleeding followed by Dr. Rumalda.  Hx of benign endometrial polyp.    Lumbar back pain    Morbid (severe) obesity due to excess calories (HCC) 11/08/2022   bmi 47.36   Obesity    morbid obesity   Personal history of colonic polyps- serrated adenoma 05/07/2013   05/2013 - diminutive ssa repeat colon   11/11/2017 3 diminutive polyps removed 1 adenoma 2 benign mucosal   11/08/2022 1 diminutive polyp     [name], MD, [facility]      Pre-diabetes    Premenopausal menorrhagia    Right knee pain     Current Outpatient Medications on File Prior to Visit  Medication Sig Dispense Refill   acetaminophen  (TYLENOL ) 650 MG CR tablet Take 650 mg by mouth every 8 (eight) hours as needed for pain. Only when needed     Multiple Vitamins-Minerals (ALIVE WOMENS 50+ GUMMY) CHEW Chew 2 tablets by mouth daily.     Polyethyl Glycol-Propyl Glycol (SYSTANE OP) Place 1 drop into both eyes 2 (two) times daily as needed (dry eyes).     rosuvastatin  (CRESTOR ) 20 MG tablet Take 1 tablet (20 mg total) by mouth daily. 90 tablet 3   Current Facility-Administered Medications on File Prior to Visit  Medication Dose Route Frequency  Provider Last Rate Last Admin   0.9 %  sodium chloride  infusion  500 mL Intravenous Continuous Commander [name] BRAVO, MD        Family History  Problem Relation Age of Onset   Hypertension Mother    Breast cancer Mother 2   Alcohol abuse Father    Hypertension Father    Heart disease Father    Gout Father    Breast cancer Sister        37s   Hypertension Sister    Hypertension Sister    Colon cancer Neg Hx    Ovarian cancer Neg Hx    Endometrial cancer Neg Hx    Pancreatic cancer Neg Hx    Prostate cancer Neg Hx    Colon polyps Neg Hx    Esophageal cancer Neg Hx    Rectal cancer Neg Hx    Stomach cancer Neg Hx     Social History   Socioeconomic History   Marital status: Single    Spouse name: Not on file   Number of children: 2   Years of education: Not on file   Highest education level: Not on file  Occupational History   Occupation: paralegal   Occupation: Oceanographer:  [employer]   Occupation: retired  Tobacco Use   Smoking status: Former    Current packs/day: 0.00    Types: Cigarettes    Start date: 07/19/1971    Quit date: 07/18/1981    Years since quitting: 42.4   Smokeless tobacco: Never  Vaping Use   Vaping status: Never Used  Substance and Sexual Activity   Alcohol use: No    Alcohol/week: 0.0 standard drinks of alcohol   Drug use: No   Sexual activity: Not Currently    Birth control/protection: Post-menopausal  Other Topics Concern   Not on file  Social History Narrative    The patient is a IT consultant, she is divorced and widowed , 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   Unemployed as of summer 2014, job eliminated   1 son one daughter   No caffeine   Updated as of 03/30/2013   Social Drivers of Health   Financial Resource Strain: Low Risk  (12/29/2023)   Overall Financial Resource Strain (CARDIA)    Difficulty of Paying Living Expenses: Not hard at all  Food Insecurity: No Food Insecurity (12/29/2023)    Hunger Vital Sign    Worried About Running Out of Food in the Last Year: Never true    Ran Out of Food in the Last Year: Never true  Transportation Needs: No Transportation Needs (12/29/2023)   PRAPARE - Administrator, Civil Service (Medical): No    Lack of Transportation (Non-Medical): No  Physical Activity: Inactive (12/29/2023)   Exercise Vital Sign    Days of Exercise per Week: 0 days    Minutes of Exercise per Session: 0 min  Stress: No Stress Concern Present (02/28/2022)   [employer] of Occupational Health - Occupational Stress Questionnaire    Feeling of Stress : Not at all  Social Connections: Moderately Isolated (02/28/2022)   Social Connection and Isolation Panel    Frequency of Communication with Friends and Family: Once a week    Frequency of Social Gatherings with Friends and Family: Never    Attends Religious Services: More than 4 times per year    Active Member of [employer] or Organizations: Yes    Attends Engineer, structural: More than 4 times per year    Marital Status: Divorced  Intimate Partner Violence: Not At Risk (12/29/2023)   Humiliation, Afraid, Rape, and Kick questionnaire    Fear of Current or Ex-Partner: No    Emotionally Abused: No    Physically Abused: No    Sexually Abused: No    Review of Systems: ROS negative except for what is noted on the assessment and plan.  Vitals:   12/29/23 0928  BP: 128/81  Pulse: 80  Temp: 98.4 F (36.9 C)  TempSrc: Oral  SpO2: 96%  Weight: (!) 303 lb 6.4 oz (137.6 kg)  Height: 5' 5 (1.651 m)    Physical Exam: Constitutional: obese, sitting in chair, in no acute distress Cardiovascular: regular rate and rhythm, no m/r/g Pulmonary/Chest: normal work of breathing on room air, lungs clear to auscultation bilaterally Skin: warm and dry Psych: normal mood and behavior  Assessment & Plan:     Patient discussed with Dr. Jeanelle  Essential hypertension Normotensive.  Continue  Olmesartan -Amlodipine -Hydrochlorothiazide  40-10-12.5 mg.  Prediabetes Check A1c today.  Healthcare maintenance Will consider Shingrix.  Morbid obesity (HCC) Unfortunately has gained 15 pounds since being seen last.  She was not surprised by this as she endorsed some unhealthy lifestyle habits  over the past few months.  I extensively counseled on how to best approach weight loss in terms of dietary choices and increasing activity.  She has seen a nutritionist for this in the past and has a lot of information which she will review.  I set a goal for 15 pounds of weight loss between now and her annual wellness visit in approximately 2 months.  Will discuss GLP-1 if weight loss is not adequate.  History of iron deficiency anemia Will check CBC today.  Colonoscopy last year showed hyperplastic polyp but was negative for malignancy with plan to repeat in 5 years.   Norman Lobstein, D.O. [facility] Internal Medicine, PGY-2 Phone: [phone] Date 12/29/2023 Time 10:51 AM

## 2023-12-29 NOTE — Assessment & Plan Note (Signed)
 Normotensive.  Continue Olmesartan -Amlodipine -Hydrochlorothiazide  40-10-12.5 mg.

## 2023-12-29 NOTE — Progress Notes (Signed)
 Internal Medicine Clinic Attending  Case discussed with the resident at the time of the visit.  We reviewed the resident's history and exam and pertinent patient test results.  I agree with the assessment, diagnosis, and plan of care documented in the resident's note.

## 2023-12-29 NOTE — Assessment & Plan Note (Signed)
 Unfortunately has gained 15 pounds since being seen last.  She was not surprised by this as she endorsed some unhealthy lifestyle habits over the past few months.  I extensively counseled on how to best approach weight loss in terms of dietary choices and increasing activity.  She has seen a nutritionist for this in the past and has a lot of information which she will review.  I set a goal for 15 pounds of weight loss between now and her annual wellness visit in approximately 2 months.  Will discuss GLP-1 if weight loss is not adequate.

## 2023-12-29 NOTE — Assessment & Plan Note (Addendum)
 Will check CBC today.  Colonoscopy last year showed hyperplastic polyp but was negative for malignancy with plan to repeat in 5 years.

## 2023-12-29 NOTE — Assessment & Plan Note (Signed)
 Will consider Shingrix.

## 2023-12-30 LAB — BASIC METABOLIC PANEL WITH GFR
BUN/Creatinine Ratio: 13 (ref 12–28)
BUN: 13 mg/dL (ref 8–27)
CO2: 22 mmol/L (ref 20–29)
Calcium: 9.1 mg/dL (ref 8.7–10.3)
Chloride: 104 mmol/L (ref 96–106)
Creatinine, Ser: 1 mg/dL (ref 0.57–1.00)
Glucose: 104 mg/dL — ABNORMAL HIGH (ref 70–99)
Potassium: 4.1 mmol/L (ref 3.5–5.2)
Sodium: 141 mmol/L (ref 134–144)
eGFR: 60 mL/min/1.73 (ref >59)

## 2023-12-30 LAB — HEMOGLOBIN A1C
Est. average glucose Bld gHb Est-mCnc: 137 mg/dL
Hgb A1c MFr Bld: 6.4 % — ABNORMAL HIGH (ref 4.8–5.6)

## 2023-12-30 LAB — CBC
Hematocrit: 36.9 % (ref 34.0–46.6)
Hemoglobin: 11.9 g/dL (ref 11.1–15.9)
MCH: 28.5 pg (ref 26.6–33.0)
MCHC: 32.2 g/dL (ref 31.5–35.7)
MCV: 88 fL (ref 79–97)
Platelets: 292 x10E3/uL (ref 150–450)
RBC: 4.18 x10E6/uL (ref 3.77–5.28)
RDW: 14.4 % (ref 11.7–15.4)
WBC: 8.7 x10E3/uL (ref 3.4–10.8)

## 2023-12-31 ENCOUNTER — Ambulatory Visit: Payer: Self-pay | Admitting: Student

## 2023-12-31 ENCOUNTER — Telehealth: Payer: Self-pay | Admitting: *Deleted

## 2023-12-31 ENCOUNTER — Other Ambulatory Visit: Payer: Self-pay | Admitting: Student

## 2023-12-31 DIAGNOSIS — Z1231 Encounter for screening mammogram for malignant neoplasm of breast: Secondary | ICD-10-CM

## 2023-12-31 NOTE — Telephone Encounter (Signed)
 Call to patient stated unable to schedule her next Mammogram as she is unable to use Dr. Marylu as the physician to receive the results.   Call to [facility] unable to use Dr. Marylu as the ordering Physician.  Will use an Attending.  RTC to patient will have patient call the Breast Center and have the results sent to Dr. Dayton Eastern. Copied from CRM [id number]. Topic: Referral - Question >> Dec 31, 2023 11:11 AM [name] LABOR wrote: Reason for CRM: Patient is requesting call back from Dr. Marylu regarding mammogram screening. Patient would like call back asap. Please contact patient at [phone].

## 2024-01-06 ENCOUNTER — Other Ambulatory Visit: Payer: Self-pay | Admitting: Internal Medicine

## 2024-01-06 DIAGNOSIS — Z1231 Encounter for screening mammogram for malignant neoplasm of breast: Secondary | ICD-10-CM

## 2024-01-13 ENCOUNTER — Ambulatory Visit
Admission: RE | Admit: 2024-01-13 | Discharge: 2024-01-13 | Disposition: A | Discharge status: Home / Self Care | Source: Ambulatory Visit | Attending: Internal Medicine | Admitting: Internal Medicine

## 2024-01-13 ENCOUNTER — Ambulatory Visit: Payer: 59 | Admitting: Gynecologic Oncology

## 2024-01-13 DIAGNOSIS — Z1231 Encounter for screening mammogram for malignant neoplasm of breast: Secondary | ICD-10-CM

## 2024-01-14 NOTE — Progress Notes (Unsigned)
 Gynecologic Oncology Return Clinic Visit  01/20/24  Reason for Visit: surveillance for endometrial cancer  Treatment History: Oncology History  Endometrial cancer, grade I (HCC)  05/30/2021 Initial Biopsy   Endometrial biopsy shows FIGO grade 1 endometrioid adenocarcinoma   06/06/2021 Initial Diagnosis   Endometrial cancer, grade I (HCC)   07/10/2021 Surgery   TRH/BSO, bilateral SLN bx, reduction of umbilical hernia  Findings:  On EUA, 10cm mobile uterus. On intra-abdominal entry, normal upper abdominal survey. Small amount of omentum within the umbilical hernia, easily reduced. Normal small and large bowel. Normal omentum. 12 cm bulbous uterus. Normal bilateral adnexa. Mapping successful to right common and left external iliac SLNs. No intra-abdominal or pelvic evidence of disease.    07/10/2021 Pathology Results   Stage IA, grade 1 endometrioid adenocarcinoma MI: 25% No LVI SLNs negative    Interval History: Doing well.  Denies vaginal bleeding or discharge.  Reports baseline bowel and bladder function.  Voids frequently and relates this to drinking water  throughout the day. No dysuria. Notes that the left upper abdominal/flank pain that she was feeling after surgery has resolved. Had skin changes to the right abdomen that resolved after application of alcohol and vaseline after. Has intermittent blood when wiping after a bowel movement. She uses preparation H as needed. Appetite is up and down. She tries to take in a balanced diet with fruits and vegetables. She tries to be move active, including doing things around the house, walking to the mailbox. She is looking into starting a program through her insurance where she can become a member at at a gym. Abdomen feels heavy at times which she relates to her weight. No symptoms concerning for recurrence voiced.  Past Medical/Surgical History: Past Medical History:  Diagnosis Date   Acanthosis nigricans    Allergic rhinitis    Discomfort  of right groin 03/14/2010   Qualifier: Diagnosis of   By: Waylan MD, Adine         Elevated LFTs    Resolved- etiology unclear hep B, Hep C serology neg 2005   Endometrial cancer (HCC) 07/2021   stage 1   Endometrial polyp    benign 12/06   Gastric ulcer 02/20/2013   Gastric ulcer with hemorrhage 02/20/2013   GERD (gastroesophageal reflux disease)    Hx of abnormal cervical Pap smear    1980 normal in 2006.    Hyperlipidemia    Hypertension    Iron deficiency anemia    postmenopausal bleeding followed by Dr. Rumalda.  Hx of benign endometrial polyp.    Lumbar back pain    Morbid (severe) obesity due to excess calories (HCC) 11/08/2022   bmi 47.36   Obesity    morbid obesity   Personal history of colonic polyps- serrated adenoma 05/07/2013   05/2013 - diminutive ssa repeat colon   11/11/2017 3 diminutive polyps removed 1 adenoma 2 benign mucosal   11/08/2022 1 diminutive polyp     [name], MD, [facility]      Pre-diabetes    Premenopausal menorrhagia    Right knee pain     Past Surgical History:  Procedure Laterality Date   COLONOSCOPY     COLONOSCOPY N/A 05/05/2013   Procedure: COLONOSCOPY;  Surgeon: [name] FORBES Commander, MD;  Location: [facility] ENDOSCOPY;  Service: Endoscopy;  Laterality: N/A;   ESOPHAGOGASTRODUODENOSCOPY N/A 02/20/2013   Procedure: ESOPHAGOGASTRODUODENOSCOPY (EGD);  Surgeon: Toribio SHAUNNA Cedar, MD;  Location: [facility] ENDOSCOPY;  Service: Endoscopy;  Laterality: N/A;   ESOPHAGOGASTRODUODENOSCOPY N/A 05/05/2013  Procedure: ESOPHAGOGASTRODUODENOSCOPY (EGD);  Surgeon: [name] FORBES Commander, MD;  Location: [facility];  Service: Endoscopy;  Laterality: N/A;   ROBOTIC ASSISTED TOTAL HYSTERECTOMY WITH BILATERAL SALPINGO OOPHERECTOMY Bilateral 07/10/2021   Procedure: XI ROBOTIC ASSISTED TOTAL HYSTERECTOMY WITH BILATERAL SALPINGO OOPHORECTOMY;  Surgeon: Viktoria Comer SAUNDERS, MD;  Location: [facility] ORS;  Service: Gynecology;  Laterality: Bilateral;   SENTINEL NODE BIOPSY N/A 07/10/2021   Procedure: SENTINEL  NODE BIOPSY;  Surgeon: Viktoria Comer SAUNDERS, MD;  Location: [facility] ORS;  Service: Gynecology;  Laterality: N/A;   THERAPEUTIC ABORTION  1975    Family History  Problem Relation Age of Onset   Hypertension Mother    Breast cancer Mother 65   Alcohol abuse Father    Hypertension Father    Heart disease Father    Gout Father    Breast cancer Sister        35s   Hypertension Sister    Hypertension Sister    Colon cancer Neg Hx    Ovarian cancer Neg Hx    Endometrial cancer Neg Hx    Pancreatic cancer Neg Hx    Prostate cancer Neg Hx    Colon polyps Neg Hx    Esophageal cancer Neg Hx    Rectal cancer Neg Hx    Stomach cancer Neg Hx     Social History   Socioeconomic History   Marital status: Single    Spouse name: Not on file   Number of children: 2   Years of education: Not on file   Highest education level: Not on file  Occupational History   Occupation: paralegal   Occupation: Oceanographer: [employer]   Occupation: retired  Tobacco Use   Smoking status: Former    Current packs/day: 0.00    Types: Cigarettes    Start date: 07/19/1971    Quit date: 07/18/1981    Years since quitting: 42.5   Smokeless tobacco: Never  Vaping Use   Vaping status: Never Used  Substance and Sexual Activity   Alcohol use: No    Alcohol/week: 0.0 standard drinks of alcohol   Drug use: No   Sexual activity: Not Currently    Birth control/protection: Post-menopausal  Other Topics Concern   Not on file  Social History Narrative    The patient is a IT consultant, she is divorced and widowed , 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   Unemployed as of summer 2014, job eliminated   1 son one daughter   No caffeine   Updated as of 03/30/2013   Social Drivers of Health   Financial Resource Strain: Low Risk  (12/29/2023)   Overall Financial Resource Strain (CARDIA)    Difficulty of Paying Living Expenses: Not hard at all  Food Insecurity: No Food Insecurity  (12/29/2023)   Hunger Vital Sign    Worried About Running Out of Food in the Last Year: Never true    Ran Out of Food in the Last Year: Never true  Transportation Needs: No Transportation Needs (12/29/2023)   PRAPARE - Administrator, Civil Service (Medical): No    Lack of Transportation (Non-Medical): No  Physical Activity: Inactive (12/29/2023)   Exercise Vital Sign    Days of Exercise per Week: 0 days    Minutes of Exercise per Session: 0 min  Stress: No Stress Concern Present (02/28/2022)   [employer] of Occupational Health - Occupational Stress Questionnaire    Feeling of Stress : Not at all  Social Connections: Moderately Isolated (02/28/2022)   Social Connection and Isolation Panel    Frequency of Communication with Friends and Family: Once a week    Frequency of Social Gatherings with Friends and Family: Never    Attends Religious Services: More than 4 times per year    Active Member of [employer] or Organizations: Yes    Attends Engineer, structural: More than 4 times per year    Marital Status: Divorced    Current Medications:  Current Outpatient Medications:    acetaminophen  (TYLENOL ) 650 MG CR tablet, Take 650 mg by mouth every 8 (eight) hours as needed for pain. Only when needed, Disp: , Rfl:    Multiple Vitamins-Minerals (ALIVE WOMENS 50+ GUMMY) CHEW, Chew 2 tablets by mouth daily., Disp: , Rfl:    Olmesartan -amLODIPine -HCTZ 40-10-12.5 MG TABS, Take 1 tablet by mouth daily., Disp: 90 tablet, Rfl: 3   Polyethyl Glycol-Propyl Glycol (SYSTANE OP), Place 1 drop into both eyes 2 (two) times daily as needed (dry eyes)., Disp: , Rfl:    rosuvastatin  (CRESTOR ) 20 MG tablet, Take 1 tablet (20 mg total) by mouth daily., Disp: 90 tablet, Rfl: 3  Current Facility-Administered Medications:    0.9 %  sodium chloride  infusion, 500 mL, Intravenous, Continuous, Avram [name] BRAVO, MD  Review of Systems: No new symptoms reported on ROS intake form. Denies fevers,  chills, fatigue, unexplained weight changes. Appetite is up and down. Denies hearing loss, neck lumps or masses, mouth sores, ringing in ears or voice changes. Denies cough or wheezing.  Denies shortness of breath. Denies chest pain or palpitations. Denies leg swelling. Denies abdominal distention, pain, blood in stools, constipation, diarrhea, nausea, vomiting, or early satiety. Denies pain with intercourse, dysuria, frequency, hematuria or incontinence. Denies hot flashes, pelvic pain, vaginal bleeding or vaginal discharge.   Denies joint pain, back pain or muscle pain/cramps. Denies itching, rash, or wounds. Denies dizziness, headaches, numbness or seizures. Denies swollen lymph nodes or glands, denies easy bruising or bleeding. Denies anxiety, depression, confusion, or decreased concentration.  Physical Exam: BP (!) 129/54 (BP Location: Left Arm, Patient Position: Sitting)   Pulse 85   Temp 98.9 F (37.2 C) (Oral)   Resp 18   Ht 5' 5.5 (1.664 m)   Wt 297 lb (134.7 kg)   SpO2 98%   BMI 48.67 kg/m  General: Alert, oriented, no acute distress. HEENT: Normocephalic, atraumatic, sclera anicteric. Chest: Clear to auscultation bilaterally.  No wheezes or rhonchi. Cardiovascular: Regular rate and rhythm, no murmurs. Abdomen: Obese, soft, nontender.  Normoactive bowel sounds.  No masses or hepatosplenomegaly appreciated.  Well-healed incisions. No tenderness. No palpable masses but limited due to habitus. No skin changes.  Extremities: Grossly normal range of motion.  Warm, well perfused.  No edema bilaterally. Skin: No rashes or lesions noted. Lymphatics: No cervical, supraclavicular, or inguinal adenopathy. GU: Normal appearing external genitalia without erythema, excoriation.  Small, less than 1 cm smooth, round lesion along the right inferior labia, nontender.  Speculum exam reveals well rugated vaginal mucosa.  Some redundancy of the vaginal walls.  No masses noted.  No bleeding or  discharge.  Bimanual exam reveals cuff intact, no nodularity or masses.  Rectovaginal exam confirms these findings.  Laboratory & Radiologic Studies: Labs 12/29/23 with A1C 6.4 01/13/24: mammogram 11/08/2022: colonoscopy  Assessment & Plan: [name] is a 73 y.o. woman with Stage IA2 grade 1 endometrioid endometrial adenocarcinoma who presents for surveillance. Surgery in 07/2021. MMR IHC intact.  MS-stable.  Patient is overall doing well.  NED on exam today.   Suspected sebaceous cyst along the right labia, smooth, unchanged from previous exam.   Offered support for efforts to increase activity. Information given on the [facility] as an option as well. Advised to reach out to our office if she is interested in a referral. Advised to monitor minimal rectal bleeding when wiping and to contact GI or PCP if persists.     Per NCCN surveillance recommendations, we will continue surveillance visits every 6 months. Her next visit to be with Dr. Viktoria in 1 year.  We reviewed signs and symptoms that would be concerning for cancer recurrence and I stressed the importance of calling if she develops any of these between visits.  20 minutes of total time was spent for this patient encounter, including preparation, face-to-face counseling with the patient and coordination of care, and documentation of the encounter.  Eleanor Epps NP [facility] GYN Oncology

## 2024-01-14 NOTE — Patient Instructions (Addendum)
 It was good to see you today.  I do not see or feel any evidence of cancer recurrence on your exam.   We will see you back for follow-up in 6 months.   As always, if you develop any new and concerning symptoms before your next visit, please call to see me sooner.  Symptoms to report to your health care team include vaginal bleeding, rectal bleeding, bloating, weight loss without effort, new and persistent pain, new and  persistent fatigue, new leg swelling, new masses (i.e., bumps in your neck or groin), new and persistent cough, new and persistent nausea and vomiting, change in bowel or bladder habits, and any other concerns.   If you would like a referral to meet with a provider at the [facility] with [facility], we will be happy to place that referral for you.  I think giving [employer] a try with efforts to increase your physical activity is a good plan.

## 2024-01-16 ENCOUNTER — Encounter: Payer: Self-pay | Admitting: Gynecologic Oncology

## 2024-01-20 ENCOUNTER — Inpatient Hospital Stay: Attending: Gynecologic Oncology | Admitting: Gynecologic Oncology

## 2024-01-20 VITALS — BP 129/54 | HR 85 | Temp 98.9°F | Resp 18 | Ht 65.5 in | Wt 297.0 lb

## 2024-01-20 DIAGNOSIS — Z90722 Acquired absence of ovaries, bilateral: Secondary | ICD-10-CM | POA: Diagnosis not present

## 2024-01-20 DIAGNOSIS — Z90710 Acquired absence of both cervix and uterus: Secondary | ICD-10-CM | POA: Diagnosis not present

## 2024-01-20 DIAGNOSIS — K625 Hemorrhage of anus and rectum: Secondary | ICD-10-CM | POA: Diagnosis not present

## 2024-01-20 DIAGNOSIS — Z8542 Personal history of malignant neoplasm of other parts of uterus: Secondary | ICD-10-CM | POA: Insufficient documentation

## 2024-01-20 DIAGNOSIS — C541 Malignant neoplasm of endometrium: Secondary | ICD-10-CM

## 2024-01-20 DIAGNOSIS — Z9079 Acquired absence of other genital organ(s): Secondary | ICD-10-CM | POA: Insufficient documentation

## 2024-02-10 ENCOUNTER — Telehealth: Payer: Self-pay | Admitting: *Deleted

## 2024-02-10 NOTE — Telephone Encounter (Signed)
 Spoke with [name] who called the office stating Sunday night when she went to the bathroom and tried to have a bowel movement blood came out into the toilet and she was unable to pass stool.   Patient states she does have hemorrhoids. Pt denies pain, constipation, straining, cramping, no vaginal discharge or bleeding. Pt also denies fever and or chills.   Patient reports having a bowel movement on Monday evening with no bleeding. She states, I don't take stool softeners or laxatives and my bowel movements are not hard  Pt hasn't had anymore rectal bleeding since Sunday evening. Pt also states had her colonoscopy last year and they removed 2 polyps.   Advised patient to monitor for any future rectal bleeding and message would be relayed to providers and the office would call back with recommendations.

## 2024-02-10 NOTE — Telephone Encounter (Signed)
 Spoke with patient and relayed message from Eleanor Epps, NP to advise patient to follow up with her PCP and or gastroenterologist in regards to rectal bleeding. Pt verbalized understanding and thanked the office for calling.

## 2024-02-17 ENCOUNTER — Ambulatory Visit (INDEPENDENT_AMBULATORY_CARE_PROVIDER_SITE_OTHER): Payer: Self-pay | Admitting: Student

## 2024-02-17 VITALS — BP 126/76 | HR 81 | Temp 98.2°F | Ht 65.5 in | Wt 296.0 lb

## 2024-02-17 DIAGNOSIS — Z90710 Acquired absence of both cervix and uterus: Secondary | ICD-10-CM

## 2024-02-17 DIAGNOSIS — K5901 Slow transit constipation: Secondary | ICD-10-CM | POA: Diagnosis not present

## 2024-02-17 DIAGNOSIS — K625 Hemorrhage of anus and rectum: Secondary | ICD-10-CM | POA: Diagnosis not present

## 2024-02-17 DIAGNOSIS — Z87891 Personal history of nicotine dependence: Secondary | ICD-10-CM | POA: Diagnosis not present

## 2024-02-17 DIAGNOSIS — Z8542 Personal history of malignant neoplasm of other parts of uterus: Secondary | ICD-10-CM | POA: Diagnosis not present

## 2024-02-17 DIAGNOSIS — Z90722 Acquired absence of ovaries, bilateral: Secondary | ICD-10-CM

## 2024-02-17 DIAGNOSIS — Z9079 Acquired absence of other genital organ(s): Secondary | ICD-10-CM

## 2024-02-17 MED ORDER — POLYETHYLENE GLYCOL 3350 17 G PO PACK
17.0000 g | PACK | Freq: Every day | ORAL | 0 refills | Status: AC
Start: 2024-02-17 — End: ?

## 2024-02-17 NOTE — Progress Notes (Unsigned)
 Subjective:  CC: Bright red blood in stools  HPI:  [name] is a 73 y.o. female with a past medical history stated below and presents today for conversation about bright blood in her stiool . Please see problem based assessment and plan for additional details.  Past Medical History:  Diagnosis Date   Acanthosis nigricans    Allergic rhinitis    Discomfort of right groin 03/14/2010   Qualifier: Diagnosis of   By: Waylan MD, Adine         Elevated LFTs    Resolved- etiology unclear hep B, Hep C serology neg 2005   Endometrial cancer (HCC) 07/2021   stage 1   Endometrial polyp    benign 12/06   Gastric ulcer 02/20/2013   Gastric ulcer with hemorrhage 02/20/2013   GERD (gastroesophageal reflux disease)    Hx of abnormal cervical Pap smear    1980 normal in 2006.    Hyperlipidemia    Hypertension    Iron deficiency anemia    postmenopausal bleeding followed by Dr. Rumalda.  Hx of benign endometrial polyp.    Lumbar back pain    Morbid (severe) obesity due to excess calories (HCC) 11/08/2022   bmi 47.36   Obesity    morbid obesity   Personal history of colonic polyps- serrated adenoma 05/07/2013   05/2013 - diminutive ssa repeat colon   11/11/2017 3 diminutive polyps removed 1 adenoma 2 benign mucosal   11/08/2022 1 diminutive polyp     [name], MD, [facility]      Pre-diabetes    Premenopausal menorrhagia    Right knee pain     Current Outpatient Medications on File Prior to Visit  Medication Sig Dispense Refill   acetaminophen  (TYLENOL ) 650 MG CR tablet Take 650 mg by mouth every 8 (eight) hours as needed for pain. Only when needed     Multiple Vitamins-Minerals (ALIVE WOMENS 50+ GUMMY) CHEW Chew 2 tablets by mouth daily.     Olmesartan -amLODIPine -HCTZ 40-10-12.5 MG TABS Take 1 tablet by mouth daily. 90 tablet 3   Polyethyl Glycol-Propyl Glycol (SYSTANE OP) Place 1 drop into both eyes 2 (two) times daily as needed (dry eyes).     rosuvastatin  (CRESTOR ) 20 MG  tablet Take 1 tablet (20 mg total) by mouth daily. 90 tablet 3   Current Facility-Administered Medications on File Prior to Visit  Medication Dose Route Frequency Provider Last Rate Last Admin   0.9 %  sodium chloride  infusion  500 mL Intravenous Continuous Commander [name] BRAVO, MD        Family History  Problem Relation Age of Onset   Hypertension Mother    Breast cancer Mother 77   Alcohol abuse Father    Hypertension Father    Heart disease Father    Gout Father    Breast cancer Sister        81s   Hypertension Sister    Hypertension Sister    Colon cancer Neg Hx    Ovarian cancer Neg Hx    Endometrial cancer Neg Hx    Pancreatic cancer Neg Hx    Prostate cancer Neg Hx    Colon polyps Neg Hx    Esophageal cancer Neg Hx    Rectal cancer Neg Hx    Stomach cancer Neg Hx     Social History   Socioeconomic History   Marital status: Single    Spouse name: Not on file   Number of children: 2  Years of education: Not on file   Highest education level: Not on file  Occupational History   Occupation: paralegal   Occupation: SECRETARY/RECEPTIONIST    Employer: [employer]   Occupation: retired  Tobacco Use   Smoking status: Former    Current packs/day: 0.00    Types: Cigarettes    Start date: 07/19/1971    Quit date: 07/18/1981    Years since quitting: 42.6   Smokeless tobacco: Never  Vaping Use   Vaping status: Never Used  Substance and Sexual Activity   Alcohol use: No    Alcohol/week: 0.0 standard drinks of alcohol   Drug use: No   Sexual activity: Not Currently    Birth control/protection: Post-menopausal  Other Topics Concern   Not on file  Social History Narrative    The patient is a IT consultant, she is divorced and widowed , 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   Unemployed as of summer 2014, job eliminated   1 son one daughter   No caffeine   Updated as of 03/30/2013   Social Drivers of Health   Financial Resource Strain: Low Risk   (12/29/2023)   Overall Financial Resource Strain (CARDIA)    Difficulty of Paying Living Expenses: Not hard at all  Food Insecurity: No Food Insecurity (12/29/2023)   Hunger Vital Sign    Worried About Running Out of Food in the Last Year: Never true    Ran Out of Food in the Last Year: Never true  Transportation Needs: No Transportation Needs (12/29/2023)   PRAPARE - Administrator, Civil Service (Medical): No    Lack of Transportation (Non-Medical): No  Physical Activity: Inactive (12/29/2023)   Exercise Vital Sign    Days of Exercise per Week: 0 days    Minutes of Exercise per Session: 0 min  Stress: No Stress Concern Present (02/28/2022)   [employer] of Occupational Health - Occupational Stress Questionnaire    Feeling of Stress : Not at all  Social Connections: Moderately Isolated (02/28/2022)   Social Connection and Isolation Panel    Frequency of Communication with Friends and Family: Once a week    Frequency of Social Gatherings with Friends and Family: Never    Attends Religious Services: More than 4 times per year    Active Member of [employer] or Organizations: Yes    Attends Engineer, structural: More than 4 times per year    Marital Status: Divorced  Intimate Partner Violence: Not At Risk (12/29/2023)   Humiliation, Afraid, Rape, and Kick questionnaire    Fear of Current or Ex-Partner: No    Emotionally Abused: No    Physically Abused: No    Sexually Abused: No    Review of Systems: ROS negative except for what is noted on the assessment and plan.  Objective:   Vitals:   02/17/24 0914 02/17/24 0945  BP: (!) 140/81 126/76  Pulse: 94 81  Temp: 98.2 F (36.8 C)   TempSrc: Oral   SpO2: 97%   Weight: 296 lb (134.3 kg)   Height: 5' 5.5 (1.664 m)     Physical Exam: Constitutional: well-appearing woman sitting in chair, in no acute distress HENT: normocephalic atraumatic, mucous membranes moist Eyes: conjunctiva  non-erythematous Cardiovascular: regular rate and rhythm, no m/r/g Pulmonary/Chest: normal work of breathing on room air, lungs clear to auscultation bilaterally Abdominal: soft, non-tender Neurological: alert & oriented x 3, normal gait Skin: warm and dry Psych: pleasant mood and affect  Assessment & Plan:   Bright red blood per rectum Reports two isolated incidents of BRBPR without pain melanotic stools. Less than a teaspoon. Her BM are every other day, brown, and regular. No orthostatic symptoms. Recent colonoscopy in 2024 without major abnormalities with a diminutive polyp. Last CBC grossly normal.   Patient disclosed that she has been having heightened awareness of her body and any abnormality to her is potentially associated with her prior history endometrial carcinoma s/p robotic total hysterectomy with bl salpingo-oophrectomy. She is afraid it may recur.  Reassured about the isolated incidents. No lab work today. Reviewed red flag symptoms. Follow up as need    Return in about 3 months (around 05/18/2024), or if symptoms worsen or fail to improve, for CHronic condition follow up.  Patient discussed with Dr. Shawn Hadassah Kristy Rosario, MD [facility]

## 2024-02-17 NOTE — Patient Instructions (Signed)
 Thank you, [name] for allowing us  to provide your care today. Today we discussed   Hematochezia - this is what it is called when you have bright red blood per rectum.   I added some information to help you watch out for signs and symptoms to call us  back. - I also added Miralax  daily - Continue making the nutritious meals you told me about. Focus on fiber and water !  Stress and anxiety - Thank you for sharing with me about your worries. I would like to get you some more support after your cancer diagnosis and treatment. I will send a referral and you should be contracted in the next week or two.   Please follow-up in: you should return in about 3 months or earlier if the symptoms worsen    We look forward to seeing you next time. Please call our clinic at [phone] if you have any questions or concerns. The best time to call is Monday-Friday from 9am-4pm, but there is someone available 24/7. If after hours or the weekend, call the main hospital number and ask for the Internal Medicine Resident On-Call. If you need medication refills, please notify your pharmacy one week in advance and they will send us  a request.   Thank you for letting us  take part in your care. Wishing you the best!  Elnora Ip, MD 02/17/2024, 10:18 AM [name]

## 2024-02-19 NOTE — Assessment & Plan Note (Signed)
 Reports two isolated incidents of BRBPR without pain melanotic stools. Less than a teaspoon. Her BM are every other day, brown, and regular. No orthostatic symptoms. Recent colonoscopy in 2024 without major abnormalities with a diminutive polyp. Last CBC grossly normal.   Patient disclosed that she has been having heightened awareness of her body and any abnormality to her is potentially associated with her prior history endometrial carcinoma s/p robotic total hysterectomy with bl salpingo-oophrectomy. She is afraid it may recur.  Reassured about the isolated incidents. No lab work today. Reviewed red flag symptoms. Follow up as need

## 2024-02-20 NOTE — Progress Notes (Signed)
 Internal Medicine Clinic Attending  Case discussed with the resident at the time of the visit.  We reviewed the resident's history and exam and pertinent patient test results.  I agree with the assessment, diagnosis, and plan of care documented in the resident's note.

## 2024-04-06 ENCOUNTER — Telehealth: Payer: Self-pay | Admitting: *Deleted

## 2024-04-06 NOTE — Telephone Encounter (Signed)
 Spoke with [name] who called the office stating she started with a sharp pain on the right side of her ribs two hours ago. Pt reports the pain is constant and rates it 10/10. Pt is able to speak clearly and unlabored and denies shortness of breath, fever, no nausea or vomiting. States she has not had any changes to bladder or bowel habits. Pt denies vaginal bleeding, pelvic pain,fever and or chills. Pt states she was going to try and take some tylenol  for the pain. Pt also denies any history of GERD or angina.    Advised patient to call her Primary Care Provider and if the pain persist to go to urgent care to be evaluated.  Also advised patient her message will be relayed to provider and the office would call back if any new recommendations.

## 2024-04-07 ENCOUNTER — Encounter (HOSPITAL_COMMUNITY): Payer: Self-pay | Admitting: *Deleted

## 2024-04-07 ENCOUNTER — Other Ambulatory Visit: Payer: Self-pay

## 2024-04-07 ENCOUNTER — Ambulatory Visit (INDEPENDENT_AMBULATORY_CARE_PROVIDER_SITE_OTHER)

## 2024-04-07 ENCOUNTER — Ambulatory Visit (HOSPITAL_COMMUNITY)
Admission: EM | Admit: 2024-04-07 | Discharge: 2024-04-07 | Disposition: A | Discharge status: Home / Self Care | Attending: Family Medicine | Admitting: Family Medicine

## 2024-04-07 DIAGNOSIS — M546 Pain in thoracic spine: Secondary | ICD-10-CM | POA: Diagnosis not present

## 2024-04-07 DIAGNOSIS — R0789 Other chest pain: Secondary | ICD-10-CM

## 2024-04-07 LAB — POCT URINALYSIS DIP (MANUAL ENTRY)
Bilirubin, UA: NEGATIVE
Blood, UA: NEGATIVE
Glucose, UA: NEGATIVE mg/dL
Ketones, POC UA: NEGATIVE mg/dL
Leukocytes, UA: NEGATIVE
Nitrite, UA: NEGATIVE
Protein Ur, POC: 30 mg/dL — AB
Spec Grav, UA: 1.025 (ref 1.010–1.025)
Urobilinogen, UA: 1 U/dL
pH, UA: 6 (ref 5.0–8.0)

## 2024-04-07 MED ORDER — BACLOFEN 5 MG PO TABS: 5.0000 mg | ORAL_TABLET | Freq: Every evening | ORAL | 0 refills | Status: AC | PRN

## 2024-04-07 MED ORDER — LIDOCAINE 5 % EX PTCH: 1.0000 | MEDICATED_PATCH | CUTANEOUS | 0 refills | Status: AC

## 2024-04-07 NOTE — ED Triage Notes (Addendum)
 PT reports she has RT sided rib pain that started yesterday. Pt denies any injury . Pt does sleep on the Rt side. PT took tylenol  1000 mg  once with little relief. Pain is worse with movement.

## 2024-04-07 NOTE — ED Provider Notes (Signed)
 [facility]    CSN: [id number] Arrival date & time: 04/07/24  9195      History   Chief Complaint No chief complaint on file.   HPI [name] is a 73 y.o. female.   Patient presents today with a 24-hour history of right thoracic back/rib pain.  She reports that her symptoms began after she got up out of the bed.  She does not remember injuring herself or falling.  She reports that yesterday pain was rated 10 on a 0-10 pain scale but improved overnight and is currently rated 5, described as sharp, worse with movement including twisting her trunk, no alleviating factors identified.  She did try Tylenol  which provided improvement of symptoms.  She denies any illness including cough, congestion, fever, nausea/vomiting interfering with oral intake.  She denies any urinary symptoms or history of nephrolithiasis.  She has not noticed any rash.  She is having difficulty with daily activities as moving when she is driving is difficult because of the pain.    Past Medical History:  Diagnosis Date   Acanthosis nigricans    Allergic rhinitis    Discomfort of right groin 03/14/2010   Qualifier: Diagnosis of   By: Waylan MD, Adine         Elevated LFTs    Resolved- etiology unclear hep B, Hep C serology neg 2005   Endometrial cancer (HCC) 07/2021   stage 1   Endometrial polyp    benign 12/06   Gastric ulcer 02/20/2013   Gastric ulcer with hemorrhage 02/20/2013   GERD (gastroesophageal reflux disease)    Hx of abnormal cervical Pap smear    1980 normal in 2006.    Hyperlipidemia    Hypertension    Iron deficiency anemia    postmenopausal bleeding followed by Dr. Rumalda.  Hx of benign endometrial polyp.    Lumbar back pain    Morbid (severe) obesity due to excess calories (HCC) 11/08/2022   bmi 47.36   Obesity    morbid obesity   Personal history of colonic polyps- serrated adenoma 05/07/2013   05/2013 - diminutive ssa repeat colon   11/11/2017 3 diminutive polyps  removed 1 adenoma 2 benign mucosal   11/08/2022 1 diminutive polyp     [name], MD, [facility]      Pre-diabetes    Premenopausal menorrhagia    Right knee pain     Patient Active Problem List   Diagnosis Date Noted   Morbid obesity (HCC) 06/06/2023   Left upper quadrant abdominal tenderness 01/24/2023   Gout 12/30/2022   Breast asymmetry 11/15/2021   Endometrial cancer, grade I (HCC) 06/06/2021   PMB (postmenopausal bleeding) 02/21/2021   Gait abnormality 01/20/2020   Gastroesophageal reflux disease without esophagitis 07/15/2018   Need for immunization against influenza 01/28/2018   Prediabetes 01/19/2017   Lipoma 09/01/2013   History of colonic polyps 05/07/2013   History of gastric ulcer 02/20/2013   Bright red blood per rectum 02/19/2013   Healthcare maintenance 07/18/2010   Hyperlipidemia 05/22/2006   Body mass index (BMI) of 45.0 to 49.9 in adult (HCC) 05/22/2006   History of iron deficiency anemia 05/22/2006   Essential hypertension 05/22/2006    Past Surgical History:  Procedure Laterality Date   COLONOSCOPY     COLONOSCOPY N/A 05/05/2013   Procedure: COLONOSCOPY;  Surgeon: [name] FORBES Commander, MD;  Location: [facility];  Service: Endoscopy;  Laterality: N/A;   ESOPHAGOGASTRODUODENOSCOPY N/A 02/20/2013   Procedure: ESOPHAGOGASTRODUODENOSCOPY (EGD);  Surgeon:  Toribio SHAUNNA Cedar, MD;  Location: [facility] ENDOSCOPY;  Service: Endoscopy;  Laterality: N/A;   ESOPHAGOGASTRODUODENOSCOPY N/A 05/05/2013   Procedure: ESOPHAGOGASTRODUODENOSCOPY (EGD);  Surgeon: [name] FORBES Commander, MD;  Location: [facility];  Service: Endoscopy;  Laterality: N/A;   ROBOTIC ASSISTED TOTAL HYSTERECTOMY WITH BILATERAL SALPINGO OOPHERECTOMY Bilateral 07/10/2021   Procedure: XI ROBOTIC ASSISTED TOTAL HYSTERECTOMY WITH BILATERAL SALPINGO OOPHORECTOMY;  Surgeon: Viktoria Comer SAUNDERS, MD;  Location: [facility];  Service: Gynecology;  Laterality: Bilateral;   SENTINEL NODE BIOPSY N/A 07/10/2021   Procedure: SENTINEL NODE BIOPSY;   Surgeon: Viktoria Comer SAUNDERS, MD;  Location: [facility];  Service: Gynecology;  Laterality: N/A;   THERAPEUTIC ABORTION  1975    OB History     Gravida  3   Para  2   Term  2   Preterm      AB  1   Living         SAB      IAB  1   Ectopic      Multiple      Live Births  2            Home Medications    Prior to Admission medications   Medication Sig Start Date End Date Taking? Authorizing Provider  Baclofen 5 MG TABS Take 1 tablet (5 mg total) by mouth at bedtime as needed. 04/07/24  Yes Leylah Tarnow K, PA-C  lidocaine  (LIDODERM ) 5 % Place 1 patch onto the skin daily. Remove & Discard patch within 12 hours or as directed by MD 04/07/24  Yes Gracin Soohoo K, PA-C  acetaminophen  (TYLENOL ) 650 MG CR tablet Take 650 mg by mouth every 8 (eight) hours as needed for pain. Only when needed    [provider]  Multiple Vitamins-Minerals (ALIVE WOMENS 50+ GUMMY) CHEW Chew 2 tablets by mouth daily.    [provider]  Olmesartan -amLODIPine -HCTZ 40-10-12.5 MG TABS Take 1 tablet by mouth daily. 12/29/23   Marylu Gee, DO  Polyethyl Glycol-Propyl Glycol (SYSTANE OP) Place 1 drop into both eyes 2 (two) times daily as needed (dry eyes).    [provider]  polyethylene glycol (MIRALAX ) 17 g packet Take 17 g by mouth daily. 02/17/24   Elnora Ip, MD  rosuvastatin  (CRESTOR ) 20 MG tablet Take 1 tablet (20 mg total) by mouth daily. 06/06/23 01/16/24  Marylu Gee, DO    Family History Family History  Problem Relation Age of Onset   Hypertension Mother    Breast cancer Mother 35   Alcohol abuse Father    Hypertension Father    Heart disease Father    Gout Father    Breast cancer Sister        69s   Hypertension Sister    Hypertension Sister    Colon cancer Neg Hx    Ovarian cancer Neg Hx    Endometrial cancer Neg Hx    Pancreatic cancer Neg Hx    Prostate cancer Neg Hx    Colon polyps Neg Hx    Esophageal cancer Neg Hx    Rectal cancer  Neg Hx    Stomach cancer Neg Hx     Social History Social History   Tobacco Use   Smoking status: Former    Current packs/day: 0.00    Types: Cigarettes    Start date: 07/19/1971    Quit date: 07/18/1981    Years since quitting: 42.7   Smokeless tobacco: Never  Vaping Use   Vaping status: Never Used  Substance  Use Topics   Alcohol use: No    Alcohol/week: 0.0 standard drinks of alcohol   Drug use: No     Allergies   Patient has no known allergies.   Review of Systems Review of Systems  Constitutional:  Positive for activity change. Negative for appetite change, fatigue and fever.  Respiratory:  Negative for shortness of breath.   Cardiovascular:  Negative for chest pain.  Gastrointestinal:  Negative for nausea and vomiting.  Genitourinary:  Negative for dysuria, flank pain, frequency and urgency.  Musculoskeletal:  Positive for back pain (thoracic). Negative for arthralgias and myalgias.  Skin:  Negative for rash.     Physical Exam Triage Vital Signs ED Triage Vitals  Encounter Vitals Group     BP 04/07/24 0845 118/70     Girls Systolic BP Percentile --      Girls Diastolic BP Percentile --      Boys Systolic BP Percentile --      Boys Diastolic BP Percentile --      Pulse Rate 04/07/24 0845 75     Resp 04/07/24 0845 20     Temp 04/07/24 0845 98.1 F (36.7 C)     Temp src --      SpO2 04/07/24 0845 98 %     Weight --      Height --      Head Circumference --      Peak Flow --      Pain Score 04/07/24 0843 6     Pain Loc --      Pain Education --      Exclude from Growth Chart --    No data found.  Updated Vital Signs BP 118/70   Pulse 75   Temp 98.1 F (36.7 C)   Resp 20   SpO2 98%   Visual Acuity Right Eye Distance:   Left Eye Distance:   Bilateral Distance:    Right Eye Near:   Left Eye Near:    Bilateral Near:     Physical Exam Vitals reviewed.  Constitutional:      General: She is awake. She is not in acute distress.     Appearance: Normal appearance. She is well-developed. She is not ill-appearing.     Comments: Very pleasant female appears stated age in no acute distress sitting comfortably in exam room  HENT:     Head: Normocephalic and atraumatic.  Cardiovascular:     Rate and Rhythm: Normal rate and regular rhythm.     Heart sounds: Normal heart sounds, S1 normal and S2 normal. No murmur heard. Pulmonary:     Effort: Pulmonary effort is normal.     Breath sounds: Normal breath sounds. No wheezing, rhonchi or rales.     Comments: Clear to auscultation bilaterally Chest:     Chest wall: Tenderness present. No deformity or swelling.     Comments: Tender to palpation along right posterior/lateral ribs without deformity. Abdominal:     General: Bowel sounds are normal.     Palpations: Abdomen is soft.     Tenderness: There is no abdominal tenderness. There is no right CVA tenderness, left CVA tenderness, guarding or rebound.     Comments: No CVA tenderness.  No tenderness palpation on exam.  Musculoskeletal:     Cervical back: No tenderness or bony tenderness.     Thoracic back: Tenderness present. No bony tenderness.     Lumbar back: No tenderness or bony tenderness.  Back:     Comments: Back: No pain percussion of vertebrae.  No deformity or step-off noted.  Tenderness palpation noted along right thoracic paraspinal muscles with reproduction of pain.  No spasm noted.  Increased pain with rotation but normal flexion and extension.  Skin:    Findings: No rash.     Comments: No rash on exam  Psychiatric:        Behavior: Behavior is cooperative.      UC Treatments / Results  Labs (all labs ordered are listed, but only abnormal results are displayed) Labs Reviewed  POCT URINALYSIS DIP (MANUAL ENTRY) - Abnormal; Notable for the following components:      Result Value   Protein Ur, POC =30 (*)    All other components within normal limits    EKG   Radiology DG Ribs Unilateral W/Chest  Right Result Date: 04/07/2024 EXAM: XR Ribs and AP Chest 04/07/2024 09:39:14 AM COMPARISON: None available. CLINICAL HISTORY: Right rib pain. FINDINGS: BONES: No acute displaced rib fracture. LUNGS AND PLEURA: No consolidation or pulmonary edema. No pleural effusion or pneumothorax. HEART AND MEDIASTINUM: No acute abnormality of the cardiac and mediastinal silhouettes. IMPRESSION: 1. No acute rib fracture. 2. No acute process in the lungs. Electronically signed by: Lynwood Seip MD 04/07/2024 09:48 AM EST RP Workstation: [id number]    Procedures Procedures (including critical care time)  Medications Ordered in UC Medications - No data to display  Initial Impression / Assessment and Plan / UC Course  I have reviewed the triage vital signs and the nursing notes.  Pertinent labs & imaging results that were available during my care of the patient were reviewed by me and considered in my medical decision making (see chart for details).     Patient is well-appearing, afebrile, nontoxic, nontachycardic.  UA was obtained by nursing staff during triage that showed trace protein but no evidence of hematuria patient has no CVA tenderness so low suspicion for nephrolithiasis.  She has reproducible pain with palpation of her right thoracic back and ribs so I am concerned for musculoskeletal etiology.  Rib/chest x-ray was obtained that showed no acute osseous abnormality or cardiopulmonary disease.  We discussed concern from muscle strain as etiology of symptoms.  She was encouraged to continue Tylenol  will start lidocaine  patches for pain during the day.  We discussed that she should apply the patch during the day, remove this at night, use only 1 patch for 24 hours.  Will also start low-dose baclofen at night to help with her symptoms.  No indication for dose adjustment based on metabolic panel from 12/29/2023 with creatinine of 1.00 and calculated creatinine clearance of 109 mL/min.  Recommended rest, heat,  gentle stretch.  We discussed that if her symptoms are not improving within a few days she should return for reevaluation.  We also discussed that if she develops a rash she should be seen immediately as we would need to treat for shingles as she does not have a rash today.  Strict return precautions were given including if she develops any kind of fever, rash, chest pain, shortness of breath, worsening pain, urinary symptoms.  Patient expressed understanding and agreement with treatment plan.  Final Clinical Impressions(s) / UC Diagnoses   Final diagnoses:  Rib pain on right side  Acute right-sided thoracic back pain     Discharge Instructions      Your x-ray did not show any broken ribs or anything going on in your lungs which is great news.  I believe that you have strained the muscles in your rib cage and back.  Apply lidocaine  patch during the day to help with pain relief.  Take this off at night and use only 1 patch for 24 hours.  You can take baclofen at night to help with pain.  This will make you sleepy so do not drive or drink alcohol while taking it.  Continue over-the-counter medications such as Tylenol  for pain relief.  I also recommend gentle stretch and heat.  If your symptoms are not improving quickly please follow-up with sports medicine; call to schedule an appointment.  If anything worsens and you have rash, fever, increasing pain, nausea/vomiting, chest pain, shortness of breath you need to be seen immediately.     ED Prescriptions     Medication Sig Dispense Auth. Provider   lidocaine  (LIDODERM ) 5 % Place 1 patch onto the skin daily. Remove & Discard patch within 12 hours or as directed by MD 14 patch Achille Xiang K, PA-C   Baclofen 5 MG TABS Take 1 tablet (5 mg total) by mouth at bedtime as needed. 7 tablet Carsynn Bethune K, PA-C      PDMP not reviewed this encounter.   Sherrell Rocky POUR, PA-C 04/07/24 1011

## 2024-04-07 NOTE — Discharge Instructions (Signed)
 Your x-ray did not show any broken ribs or anything going on in your lungs which is great news.  I believe that you have strained the muscles in your rib cage and back.  Apply lidocaine  patch during the day to help with pain relief.  Take this off at night and use only 1 patch for 24 hours.  You can take baclofen at night to help with pain.  This will make you sleepy so do not drive or drink alcohol while taking it.  Continue over-the-counter medications such as Tylenol  for pain relief.  I also recommend gentle stretch and heat.  If your symptoms are not improving quickly please follow-up with sports medicine; call to schedule an appointment.  If anything worsens and you have rash, fever, increasing pain, nausea/vomiting, chest pain, shortness of breath you need to be seen immediately.

## 2024-05-12 ENCOUNTER — Telehealth: Payer: Self-pay

## 2024-05-12 ENCOUNTER — Ambulatory Visit

## 2024-05-12 VITALS — Ht 65.5 in | Wt 293.0 lb

## 2024-05-12 DIAGNOSIS — Z0000 Encounter for general adult medical examination without abnormal findings: Secondary | ICD-10-CM

## 2024-05-12 NOTE — Patient Instructions (Signed)
 [name],  Thank you for taking the time for your Medicare Wellness Visit. I appreciate your continued commitment to your health goals. Please review the care plan we discussed, and feel free to reach out if I can assist you further.  Please note that Annual Wellness Visits do not include a physical exam. Some assessments may be limited, especially if the visit was conducted virtually. If needed, we may recommend an in-person follow-up with your provider.  Ongoing Care Seeing your primary care provider every 3 to 6 months helps us  monitor your health and provide consistent, personalized care.   Referrals If a referral was made during today's visit and you haven't received any updates within two weeks, please contact the referred provider directly to check on the status.  Recommended Screenings:  Health Maintenance  Topic Date Due   Zoster (Shingles) Vaccine (1 of 2) Never done   COVID-19 Vaccine (3 - Moderna risk series) 02/23/2020   Medicare Annual Wellness Visit  03/01/2023   Flu Shot  01/02/2024   Breast Cancer Screening  01/12/2026   Colon Cancer Screening  11/08/2027   DTaP/Tdap/Td vaccine (4 - Td or Tdap) 02/02/2032   Pneumococcal Vaccine for age over 49  Completed   Osteoporosis screening with Bone Density Scan  Completed   Hepatitis C Screening  Completed   Meningitis B Vaccine  Aged Out       05/12/2024    2:25 PM  Advanced Directives  Does Patient Have a Medical Advance Directive? No  Would patient like information on creating a medical advance directive? No - Patient declined    Vision: Annual vision screenings are recommended for early detection of glaucoma, cataracts, and diabetic retinopathy. These exams can also reveal signs of chronic conditions such as diabetes and high blood pressure.  Dental: Annual dental screenings help detect early signs of oral cancer, gum disease, and other conditions linked to overall health, including heart disease and  diabetes.  Please see the attached documents for additional preventive care recommendations.

## 2024-05-12 NOTE — Progress Notes (Signed)
 Chief Complaint  Patient presents with   Medicare Wellness    SUBSEQUENT     Subjective:   [name] is a 73 y.o. female who presents for a Medicare Annual Wellness Visit.  Visit info / Clinical Intake: Medicare Wellness Visit Type:: Subsequent Annual Wellness Visit Medicare Wellness Visit Mode:: Telephone If telephone:: video declined Since this visit was completed virtually, some vitals may be partially provided or unavailable. Missing vitals are due to the limitations of the virtual format.: Documented vitals are patient reported If Telephone or Video please confirm:: I connected with patient using audio/video enable telemedicine. I verified patient identity with two identifiers, discussed telehealth limitations, and patient agreed to proceed. Patient Location:: HOME Provider Location:: OFFICE Interpreter Needed?: No Pre-visit prep was completed: yes AWV questionnaire completed by patient prior to visit?: no Living arrangements:: with family/others Patient's Overall Health Status Rating: (!) fair Typical amount of pain: none Does pain affect daily life?: no Are you currently prescribed opioids?: no  Dietary Habits and Nutritional Risks How many meals a day?: 3 Eats fruit and vegetables daily?: yes Most meals are obtained by: preparing own meals In the last 2 weeks, have you had any of the following?: none Diabetic:: no  Functional Status Activities of Daily Living (to include ambulation/medication): Independent Ambulation: Independent with device- listed below Home Assistive Devices/Equipment: Eyeglasses Medication Administration: Independent Home Management (perform basic housework or laundry): Independent Manage your own finances?: yes Primary transportation is: driving Concerns about vision?: no *vision screening is required for WTM* Concerns about hearing?: no  Fall Screening Falls in the past year?: 0 Number of falls in past year: 0 Was there an  injury with Fall?: 0 Fall Risk Category Calculator: 0 Patient Fall Risk Level: Low Fall Risk  Fall Risk Patient at Risk for Falls Due to: No Fall Risks Fall risk Follow up: Falls evaluation completed; Education provided  Home and Transportation Safety: All rugs have non-skid backing?: yes All stairs or steps have railings?: N/A, no stairs Grab bars in the bathtub or shower?: (!) no Have non-skid surface in bathtub or shower?: (!) no Good home lighting?: yes Regular seat belt use?: yes Hospital stays in the last year:: no  Cognitive Assessment Difficulty concentrating, remembering, or making decisions? : no Will 6CIT or Mini Cog be Completed: yes  Advance Directives (For Healthcare) Does Patient Have a Medical Advance Directive?: No Would patient like information on creating a medical advance directive?: No - Patient declined (PENDING COMPLETION)  Reviewed/Updated  Reviewed/Updated: Reviewed All (Medical, Surgical, Family, Medications, Allergies, Care Teams, Patient Goals)    Allergies (verified) Patient has no known allergies.   Current Medications (verified) Outpatient Encounter Medications as of 05/12/2024  Medication Sig   acetaminophen  (TYLENOL ) 650 MG CR tablet Take 650 mg by mouth every 8 (eight) hours as needed for pain. Only when needed   Baclofen  5 MG TABS Take 1 tablet (5 mg total) by mouth at bedtime as needed.   lidocaine  (LIDODERM ) 5 % Place 1 patch onto the skin daily. Remove & Discard patch within 12 hours or as directed by MD   Multiple Vitamins-Minerals (ALIVE WOMENS 50+ GUMMY) CHEW Chew 2 tablets by mouth daily.   Olmesartan -amLODIPine -HCTZ 40-10-12.5 MG TABS Take 1 tablet by mouth daily.   Polyethyl Glycol-Propyl Glycol (SYSTANE OP) Place 1 drop into both eyes 2 (two) times daily as needed (dry eyes).   polyethylene glycol (MIRALAX ) 17 g packet Take 17 g by mouth daily.   rosuvastatin  (CRESTOR ) 20  MG tablet Take 1 tablet (20 mg total) by mouth daily.    Facility-Administered Encounter Medications as of 05/12/2024  Medication   0.9 %  sodium chloride  infusion    History: Past Medical History:  Diagnosis Date   Acanthosis nigricans    Allergic rhinitis    Discomfort of right groin 03/14/2010   Qualifier: Diagnosis of   By: Waylan MD, Adine         Elevated LFTs    Resolved- etiology unclear hep B, Hep C serology neg 2005   Endometrial cancer (HCC) 07/2021   stage 1   Endometrial polyp    benign 12/06   Gastric ulcer 02/20/2013   Gastric ulcer with hemorrhage 02/20/2013   GERD (gastroesophageal reflux disease)    Hx of abnormal cervical Pap smear    1980 normal in 2006.    Hyperlipidemia    Hypertension    Iron deficiency anemia    postmenopausal bleeding followed by Dr. Rumalda.  Hx of benign endometrial polyp.    Lumbar back pain    Morbid (severe) obesity due to excess calories (HCC) 11/08/2022   bmi 47.36   Obesity    morbid obesity   Personal history of colonic polyps- serrated adenoma 05/07/2013   05/2013 - diminutive ssa repeat colon   11/11/2017 3 diminutive polyps removed 1 adenoma 2 benign mucosal   11/08/2022 1 diminutive polyp     [name], MD, [facility]      Pre-diabetes    Premenopausal menorrhagia    Right knee pain    Past Surgical History:  Procedure Laterality Date   COLONOSCOPY     COLONOSCOPY N/A 05/05/2013   Procedure: COLONOSCOPY;  Surgeon: [name] FORBES Commander, MD;  Location: [facility];  Service: Endoscopy;  Laterality: N/A;   ESOPHAGOGASTRODUODENOSCOPY N/A 02/20/2013   Procedure: ESOPHAGOGASTRODUODENOSCOPY (EGD);  Surgeon: Toribio SHAUNNA Cedar, MD;  Location: [facility] ENDOSCOPY;  Service: Endoscopy;  Laterality: N/A;   ESOPHAGOGASTRODUODENOSCOPY N/A 05/05/2013   Procedure: ESOPHAGOGASTRODUODENOSCOPY (EGD);  Surgeon: [name] FORBES Commander, MD;  Location: [facility];  Service: Endoscopy;  Laterality: N/A;   ROBOTIC ASSISTED TOTAL HYSTERECTOMY WITH BILATERAL SALPINGO OOPHERECTOMY Bilateral 07/10/2021   Procedure: XI  ROBOTIC ASSISTED TOTAL HYSTERECTOMY WITH BILATERAL SALPINGO OOPHORECTOMY;  Surgeon: Viktoria Comer SAUNDERS, MD;  Location: [facility];  Service: Gynecology;  Laterality: Bilateral;   SENTINEL NODE BIOPSY N/A 07/10/2021   Procedure: SENTINEL NODE BIOPSY;  Surgeon: Viktoria Comer SAUNDERS, MD;  Location: [facility];  Service: Gynecology;  Laterality: N/A;   THERAPEUTIC ABORTION  1975   Family History  Problem Relation Age of Onset   Hypertension Mother    Breast cancer Mother 28   Alcohol abuse Father    Hypertension Father    Heart disease Father    Gout Father    Breast cancer Sister        63s   Hypertension Sister    Hypertension Sister    Colon cancer Neg Hx    Ovarian cancer Neg Hx    Endometrial cancer Neg Hx    Pancreatic cancer Neg Hx    Prostate cancer Neg Hx    Colon polyps Neg Hx    Esophageal cancer Neg Hx    Rectal cancer Neg Hx    Stomach cancer Neg Hx    Social History   Occupational History   Occupation: it consultant   Occupation: Oceanographer: [employer]   Occupation: retired  Tobacco Use   Smoking status: Former  Current packs/day: 0.00    Types: Cigarettes    Start date: 07/19/1971    Quit date: 07/18/1981    Years since quitting: 42.8   Smokeless tobacco: Never  Vaping Use   Vaping status: Never Used  Substance and Sexual Activity   Alcohol use: No    Alcohol/week: 0.0 standard drinks of alcohol   Drug use: No   Sexual activity: Not Currently    Birth control/protection: Post-menopausal   Tobacco Counseling Counseling given: Not Answered  SDOH Screenings   Food Insecurity: No Food Insecurity (05/12/2024)  Housing: Low Risk  (05/12/2024)  Transportation Needs: No Transportation Needs (05/12/2024)  Utilities: Not At Risk (05/12/2024)  Alcohol Screen: Low Risk  (05/12/2024)  Depression (PHQ2-9): Low Risk  (05/12/2024)  Financial Resource Strain: Low Risk  (05/12/2024)  Physical Activity: Sufficiently Active  (05/12/2024)  Social Connections: Moderately Isolated (05/12/2024)  Stress: No Stress Concern Present (05/12/2024)  Tobacco Use: Medium Risk (05/12/2024)  Health Literacy: Adequate Health Literacy (05/12/2024)   See flowsheets for full screening details  Depression Screen PHQ 2 & 9 Depression Scale- Over the past 2 weeks, how often have you been bothered by any of the following problems? Little interest or pleasure in doing things: 0 Feeling down, depressed, or hopeless (PHQ Adolescent also includes...irritable): 0 PHQ-2 Total Score: 0 Trouble falling or staying asleep, or sleeping too much: 0 Feeling tired or having little energy: 0 Poor appetite or overeating (PHQ Adolescent also includes...weight loss): 0 Feeling bad about yourself - or that you are a failure or have let yourself or your family down: 0 Trouble concentrating on things, such as reading the newspaper or watching television (PHQ Adolescent also includes...like school work): 0 Moving or speaking so slowly that other people could have noticed. Or the opposite - being so fidgety or restless that you have been moving around a lot more than usual: 0 Thoughts that you would be better off dead, or of hurting yourself in some way: 0 PHQ-9 Total Score: 0 If you checked off any problems, how difficult have these problems made it for you to do your work, take care of things at home, or get along with other people?: Not difficult at all  Depression Treatment Depression Interventions/Treatment : EYV7-0 Score <4 Follow-up Not Indicated     Goals Addressed             This Visit's Progress    05/12/2024: My goal for 2026 is to stay healthy and maintain.               Objective:    Today's Vitals   05/12/24 1422  Weight: 293 lb (132.9 kg)  Height: 5' 5.5 (1.664 m)  PainSc: 0-No pain   Body mass index is 48.02 kg/m.  Hearing/Vision screen Hearing Screening - Comments:: Adequate hearing. Vision Screening -  Comments:: Adequate vision, with the use of eyeglasses. Immunizations and Health Maintenance Health Maintenance  Topic Date Due   Zoster Vaccines- Shingrix (1 of 2) Never done   COVID-19 Vaccine (3 - Moderna risk series) 02/23/2020   Medicare Annual Wellness (AWV)  05/12/2025   Mammogram  01/12/2026   Colonoscopy  11/08/2027   DTaP/Tdap/Td (4 - Td or Tdap) 02/08/2032   Pneumococcal Vaccine: 50+ Years  Completed   Influenza Vaccine  Completed   Bone Density Scan  Completed   Hepatitis C Screening  Completed   Meningococcal B Vaccine  Aged Out        Assessment/Plan:  This is a  routine wellness examination for Luara.  Patient Care Team: Marylu Gee, DO as PCP - General Waylan Cain, MD as Consulting Physician (Ophthalmology) Viktoria Comer SAUNDERS, MD as Consulting Physician (Gynecologic Oncology) Micheline Eleanor BIRCH, NP as Nurse Practitioner (Gynecologic Oncology)  I have personally reviewed and noted the following in the patients chart:   Medical and social history Use of alcohol, tobacco or illicit drugs  Current medications and supplements including opioid prescriptions. Functional ability and status Nutritional status Physical activity Advanced directives List of other physicians Hospitalizations, surgeries, and ER visits in previous 12 months Vitals Screenings to include cognitive, depression, and falls Referrals and appointments  No orders of the defined types were placed in this encounter.  In addition, I have reviewed and discussed with patient certain preventive protocols, quality metrics, and best practice recommendations. A written personalized care plan for preventive services as well as general preventive health recommendations were provided to patient.   Roz LOISE Fuller, LPN   [id number]/7974   Return in 1 year (on 05/12/2025).  After Visit Summary: (MyChart) Due to this being a telephonic visit, the after visit summary with patients personalized plan  was offered to patient via MyChart   Nurse Notes: Patient aware of current care gaps.  Patient is due for Covid-19 and Shingrix vaccines.

## 2024-05-12 NOTE — Telephone Encounter (Signed)
 Patient is requesting a recommendation for a dentist or dental school. Please advise.  Pookela Sellin N. Tomie, LPN [facility] Annual Wellness Team Direct Dial: [phone]

## 2024-05-19 NOTE — Progress Notes (Signed)
 Internal Medicine Clinic Attending Patient was precepted with Dr. Karna who has since gone out on medical leave.  I have reviewed the residents history and exam and pertinent patient test results.  I agree with the assessment, diagnosis, and plan of care documented in the residents note.

## 2024-05-31 ENCOUNTER — Other Ambulatory Visit: Payer: Self-pay | Admitting: Student

## 2024-05-31 DIAGNOSIS — E7800 Pure hypercholesterolemia, unspecified: Secondary | ICD-10-CM

## 2024-05-31 DIAGNOSIS — E785 Hyperlipidemia, unspecified: Secondary | ICD-10-CM

## 2024-05-31 DIAGNOSIS — I10 Essential (primary) hypertension: Secondary | ICD-10-CM

## 2024-05-31 MED ORDER — OLMESARTAN-AMLODIPINE-HCTZ 40-10-12.5 MG PO TABS: 1.0000 | ORAL_TABLET | Freq: Every day | ORAL | 0 refills | Status: DC

## 2024-05-31 MED ORDER — ROSUVASTATIN CALCIUM 20 MG PO TABS: 20.0000 mg | ORAL_TABLET | Freq: Every day | ORAL | 0 refills | Status: DC

## 2024-05-31 NOTE — Telephone Encounter (Unsigned)
 Copied from CRM #[id number]. Topic: Clinical - Medication Refill >> May 31, 2024  9:58 AM DeAngela L wrote: Medication: Olmesartan -amLODIPine -HCTZ 40-10-12.5 MG TABS rosuvastatin  (CRESTOR ) 20 MG tablet  Has the patient contacted their pharmacy? NO, she is on the last 30 days with no refill on the bottle  (Agent: If no, request that the patient contact the pharmacy for the refill. If patient does not wish to contact the pharmacy document the reason why and proceed with request.) (Agent: If yes, when and what did the pharmacy advise?)  This is the patient's preferred pharmacy:  [facility] [id number] - [place], [place] - [address] [place] [address] Phone: [phone] Fax: [phone]  Is this the correct pharmacy for this prescription? Yes  If no, delete pharmacy and type the correct one.   Has the prescription been filled recently? Yes   Is the patient out of the medication? No   Has the patient been seen for an appointment in the last year OR does the patient have an upcoming appointment? Yes   Can we respond through MyChart? Yes   Agent: Please be advised that Rx refills may take up to 3 business days. We ask that you follow-up with your pharmacy.

## 2024-06-03 ENCOUNTER — Other Ambulatory Visit: Payer: Self-pay | Admitting: Student

## 2024-06-03 DIAGNOSIS — E7800 Pure hypercholesterolemia, unspecified: Secondary | ICD-10-CM

## 2024-06-03 DIAGNOSIS — E785 Hyperlipidemia, unspecified: Secondary | ICD-10-CM

## 2024-06-04 NOTE — Telephone Encounter (Signed)
 Medication sent to pharmacy

## 2024-06-23 ENCOUNTER — Encounter: Payer: Self-pay | Admitting: Student

## 2024-06-23 ENCOUNTER — Ambulatory Visit: Payer: Self-pay | Admitting: Student

## 2024-06-23 ENCOUNTER — Other Ambulatory Visit: Payer: Self-pay

## 2024-06-23 VITALS — BP 124/80 | HR 95 | Temp 98.3°F | Ht 65.5 in | Wt 295.2 lb

## 2024-06-23 DIAGNOSIS — E7800 Pure hypercholesterolemia, unspecified: Secondary | ICD-10-CM | POA: Diagnosis not present

## 2024-06-23 DIAGNOSIS — K5900 Constipation, unspecified: Secondary | ICD-10-CM

## 2024-06-23 DIAGNOSIS — E785 Hyperlipidemia, unspecified: Secondary | ICD-10-CM

## 2024-06-23 DIAGNOSIS — K5901 Slow transit constipation: Secondary | ICD-10-CM

## 2024-06-23 DIAGNOSIS — R7303 Prediabetes: Secondary | ICD-10-CM

## 2024-06-23 DIAGNOSIS — Z6841 Body Mass Index (BMI) 40.0 and over, adult: Secondary | ICD-10-CM | POA: Diagnosis not present

## 2024-06-23 DIAGNOSIS — I10 Essential (primary) hypertension: Secondary | ICD-10-CM | POA: Diagnosis not present

## 2024-06-23 MED ORDER — ROSUVASTATIN CALCIUM 20 MG PO TABS: 20.0000 mg | ORAL_TABLET | Freq: Every day | ORAL | 3 refills | Status: AC

## 2024-06-23 MED ORDER — OLMESARTAN-AMLODIPINE-HCTZ 40-10-12.5 MG PO TABS: 1.0000 | ORAL_TABLET | Freq: Every day | ORAL | 3 refills | Status: AC

## 2024-06-23 NOTE — Assessment & Plan Note (Signed)
 Refilled Crestor  and will check Lipid panel for monitoring given weight changes

## 2024-06-23 NOTE — Assessment & Plan Note (Signed)
 Better managed with fiber rich foods and PRN miralax . Extensive discussion about this today. Prioritizing water  intake, movement, fiber rich foods and daily Miralax  with PRN senna.  - Will need monitoring at follow up

## 2024-06-23 NOTE — Assessment & Plan Note (Signed)
 Well controlled. Continued Continue Olmesartan -Amlodipine -Hydrochlorothiazide  40-10-12.5 mg. Refilled for 90 day supply.  - Check CMP

## 2024-06-23 NOTE — Assessment & Plan Note (Signed)
 Has lost some weight in the past few months and has now plateau'd . She is committed and wanting to make changes at home. Discussed home exercise program. Would like to postpone thinking about additional medications; agreed to inquire if with her [employer] Medicare GLP1 meds would be covered. Discuss at follow up appt.

## 2024-06-23 NOTE — Patient Instructions (Addendum)
 Thank you, [name] for allowing us  to provide your care today. Today we discussed : Constipation: Water . 3 bottles every day 2. Food rich in fiber: egetable Fiber content Serving size  Artichokes (cooked) 9.6 g 1 cup  Pumpkin (canned) 7.1 g 1 cup  Taro root (cooked) 6.7 g 1 cup  Brussels sprouts (cooked) 6.4 g 1 cup  Sweet potato (cooked) 6.3 g 1 cup  Parsnips (cooked) 6.2 g 1 cup  Jicama (raw) 5.9 g 1 cup  Broccoli (cooked) 5.2 g 1 cup  Fruits Like vegetables, most fruits are a great source of fiber. Here are some high-fiber fruits and their fiber content. Fruit Fiber content  Serving size  Sapodilla or sapote 9.5 g 1 cup  Durian 9.2 g 1 cup  Guava 8.9 g 1 cup  Raspberries 8 g 1 cup  Blackberries 7.6 g 1 cup  Asian pears 6.5 g 1 medium fruit  Blueberries 6.2 g 1 cup  Passion fruit 6.1 g  cup  Persimmon 6 g 1 fruit  Kiwifruit 5.4 g 1 cup  Grapefruit 5 g  1 fruit  Avocado 5 g  cup  Legumes  Type of legume Fiber content  Serving size  Navy beans 9.6 g  cup  Yellow beans 9.2 in   cup  Lima beans 9.2  1 cup  Green peas 8.8  1 cup  Split peas 8.2 g   cup  Lentils 7.8 g   cup  Mung beans 7.7 g  cup  Black beans 7.5 g  cup  Chickpeas 6.3 g  cup  Kidney beans 5.7 g  cup  White beans 5.7 g  cup  Edamame 4.1 g  cup  Whole grains Whole grains are another type of plant-based food that packs in fiber. Here are a few different types of whole grains and the amount of fiber youll get from each. Grain Fiber content Serving size  Popcorn 5.8 g  3 cups  Quinoa (cooked) 5 g  1 cup  Oats (uncooked rolled oats) 4 g   cup  Teff (cooked) 3.6 g   cup  Barley (cooked pearled barley) 3 g   cup   3. Exercise: 20-30 mins every day Check the CHAIR EXERCISES pages I have added here. 4. Positioning in the bathroom 5. Medications - Miralax  everyday - If you didn't go one day, then add Senna - over the counter twice daily until you have a loose bowel movement. Then  stop and use when needed.   I would like to check your prediabetes levels, your cholesterol, and your kidney and liver function.  Lab Orders         Lipid Profile         Comprehensive metabolic panel with GFR         Hemoglobin A1c       I will call if any are abnormal. All of your labs can be accessed through My Chart.   My Chart Access: [email or web address]  Please follow-up in: 3 months to check on your weight,  your mobility, and your chronic medical conditions.     We look forward to seeing you next time. Please call our clinic at [phone] if you have any questions or concerns. The best time to call is Monday-Friday from 9am-4pm, but there is someone available 24/7. If after hours or the weekend, call the main hospital number and ask for the Internal Medicine Resident On-Call. If you need medication refills, please notify your pharmacy  one week in advance and they will send us  a request.   Thank you for letting us  take part in your care. Wishing you the best!  Elnora Ip, MD 06/23/2024, 10:12 AM [facility]

## 2024-06-23 NOTE — Progress Notes (Signed)
 "   Subjective:  CC: routine follow up  HPI:  [name] is a 74 y.o. female with a past medical history stated below and presents today for chronic condition follow up.   She tells me that since her last visit in [facility] she has not had bleeding per rectum, her frequency of BM has increased from 4-5 days per week to 6-7. Has increased water  intake and fiber containing foods. Has not been able to exercise much, but inquires about home exercises ideas. Her weight at home has been stable, no significant increase.  Her BP home readings have been <130/80. No falls, dizziness, or orthostatic symptoms. No general fatigue or joint pains. No incontinence or changes in urine output.  No bleeding per vagina or urine. Has been followed up by Gyn onc, upcoming appointment.   Has been adherent to blood pressure and cholesterol medications.   Overall feels safe and content. Had a great Holiday season with family in town. She is appreciative of the work with [facility] providers.   Sleeping well and enjoying her everyday life. Her great grand child is the source of much joy.   She is independent living, without mobility aids. Currently uses reading glasses, sporting them today. No difficulty with hearing. Did not inquire about driving today.   Needs dentist. Discussed speaking with Medicare provider for options in the area.  Past Medical History:  Diagnosis Date   Acanthosis nigricans    Allergic rhinitis    Discomfort of right groin 03/14/2010   Qualifier: Diagnosis of   By: Waylan MD, Adine         Elevated LFTs    Resolved- etiology unclear hep B, Hep C serology neg 2005   Endometrial cancer (HCC) 07/2021   stage 1   Endometrial polyp    benign 12/06   Gastric ulcer 02/20/2013   Gastric ulcer with hemorrhage 02/20/2013   GERD (gastroesophageal reflux disease)    Hx of abnormal cervical Pap smear    1980 normal in 2006.    Hyperlipidemia    Hypertension    Iron deficiency anemia     postmenopausal bleeding followed by Dr. Rumalda.  Hx of benign endometrial polyp.    Lumbar back pain    Morbid (severe) obesity due to excess calories (HCC) 11/08/2022   bmi 47.36   Obesity    morbid obesity   Personal history of colonic polyps- serrated adenoma 05/07/2013   05/2013 - diminutive ssa repeat colon   11/11/2017 3 diminutive polyps removed 1 adenoma 2 benign mucosal   11/08/2022 1 diminutive polyp     Sandra CHARLENA Commander, MD, [name]      Pre-diabetes    Premenopausal menorrhagia    Right knee pain     Medications Ordered Prior to Encounter[1]  Family History  Problem Relation Age of Onset   Hypertension Mother    Breast cancer Mother 32   Alcohol abuse Father    Hypertension Father    Heart disease Father    Gout Father    Breast cancer Sister        22s   Hypertension Sister    Hypertension Sister    Colon cancer Neg Hx    Ovarian cancer Neg Hx    Endometrial cancer Neg Hx    Pancreatic cancer Neg Hx    Prostate cancer Neg Hx    Colon polyps Neg Hx    Esophageal cancer Neg Hx    Rectal cancer Neg Hx  Stomach cancer Neg Hx     Social History   Socioeconomic History   Marital status: Single    Spouse name: Not on file   Number of children: 2   Years of education: Not on file   Highest education level: Not on file  Occupational History   Occupation: paralegal   Occupation: SECRETARY/RECEPTIONIST    Employer: [employer]   Occupation: retired  Tobacco Use   Smoking status: Former    Current packs/day: 0.00    Types: Cigarettes    Start date: 07/19/1971    Quit date: 07/18/1981    Years since quitting: 42.9   Smokeless tobacco: Never  Vaping Use   Vaping status: Never Used  Substance and Sexual Activity   Alcohol use: No    Alcohol/week: 0.0 standard drinks of alcohol   Drug use: No   Sexual activity: Not Currently    Birth control/protection: Post-menopausal  Other Topics Concern   Not on file  Social History Narrative    The  patient is a it consultant, she is divorced and widowed , 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   Unemployed as of summer 2014, job eliminated   1 son one daughter   No caffeine   Updated as of 03/30/2013   Social Drivers of Health   Tobacco Use: Medium Risk (06/23/2024)   Patient History    Smoking Tobacco Use: Former    Smokeless Tobacco Use: Never    Passive Exposure: Not on file  Financial Resource Strain: Low Risk (05/12/2024)   Overall Financial Resource Strain (CARDIA)    Difficulty of Paying Living Expenses: Not hard at all  Food Insecurity: No Food Insecurity (05/12/2024)   Epic    Worried About Programme Researcher, Broadcasting/film/video in the Last Year: Never true    Ran Out of Food in the Last Year: Never true  Transportation Needs: No Transportation Needs (05/12/2024)   Epic    Lack of Transportation (Medical): No    Lack of Transportation (Non-Medical): No  Physical Activity: Sufficiently Active (05/12/2024)   Exercise Vital Sign    Days of Exercise per Week: 5 days    Minutes of Exercise per Session: 30 min  Stress: No Stress Concern Present (05/12/2024)   [employer] of Occupational Health - Occupational Stress Questionnaire    Feeling of Stress: Not at all  Social Connections: Moderately Isolated (05/12/2024)   Social Connection and Isolation Panel    Frequency of Communication with Friends and Family: Once a week    Frequency of Social Gatherings with Friends and Family: Never    Attends Religious Services: More than 4 times per year    Active Member of Clubs or Organizations: Yes    Attends Banker Meetings: More than 4 times per year    Marital Status: Divorced  Intimate Partner Violence: Not At Risk (05/12/2024)   Epic    Fear of Current or Ex-Partner: No    Emotionally Abused: No    Physically Abused: No    Sexually Abused: No  Depression (PHQ2-9): Low Risk (06/23/2024)   Depression (PHQ2-9)    PHQ-2 Score: 0  Alcohol Screen: Low Risk (05/12/2024)   Alcohol  Screen    Last Alcohol Screening Score (AUDIT): 0  Housing: Low Risk (05/12/2024)   Epic    Unable to Pay for Housing in the Last Year: No    Number of Times Moved in the Last Year: 0    Homeless in the  Last Year: No  Utilities: Not At Risk (05/12/2024)   Epic    Threatened with loss of utilities: No  Health Literacy: Adequate Health Literacy (05/12/2024)   B1300 Health Literacy    Frequency of need for help with medical instructions: Never    Review of Systems: ROS negative except for what is noted on the assessment and plan.  Objective:   Vitals:   06/23/24 0929  BP: 124/80  Pulse: 95  Temp: 98.3 F (36.8 C)  TempSrc: Oral  SpO2: 100%  Weight: 295 lb 3.2 oz (133.9 kg)  Height: 5' 5.5 (1.664 m)    Physical Exam: Constitutional: well-appearing elderly sitting in chair, in no acute distress HENT: normocephalic atraumatic, mucous membranes moist Eyes: conjunctiva non-erythematous Neck: supple, non enlarged. No acanthosis nigricans Cardiovascular: regular rate and rhythm, no m/r/g Pulmonary/Chest: normal work of breathing on room air, lungs clear to auscultation bilaterally Abdominal: soft, non-tender, non-distended MSK: normal bulk and tone. Able to sit from chair without arm support. Neurological: alert & oriented x 3. Normal gait as she exited the room Skin: warm and dry Psych: pleasant, engaged, coherent, and with good recall.        06/23/2024    9:37 AM  Depression screen PHQ 2/9  Decreased Interest 0  Down, Depressed, Hopeless 0  PHQ - 2 Score 0        No data to display           Assessment & Plan:   Essential hypertension Well controlled. Continued Continue Olmesartan -Amlodipine -Hydrochlorothiazide  40-10-12.5 mg. Refilled for 90 day supply.  - Check CMP  Constipation Better managed with fiber rich foods and PRN miralax . Extensive discussion about this today. Prioritizing water  intake, movement, fiber rich foods and daily Miralax  with PRN senna.   - Will need monitoring at follow up  Hyperlipidemia Refilled Crestor  and will check Lipid panel for monitoring given weight changes  Morbid obesity (HCC) Has lost some weight in the past few months and has now plateau'd . She is committed and wanting to make changes at home. Discussed home exercise program. Would like to postpone thinking about additional medications; agreed to inquire if with her [employer] Medicare GLP1 meds would be covered. Discuss at follow up appt.    Return in about 3 months (around 09/21/2024) for Obesity, HTN, and .  Patient discussed with Dr. Jeanelle Hadassah Kristy Rosario, MD [facility]  06/23/2024, 10:31 AM         [1]  Current Outpatient Medications on File Prior to Visit  Medication Sig Dispense Refill   acetaminophen  (TYLENOL ) 650 MG CR tablet Take 650 mg by mouth every 8 (eight) hours as needed for pain. Only when needed     Baclofen  5 MG TABS Take 1 tablet (5 mg total) by mouth at bedtime as needed. 7 tablet 0   lidocaine  (LIDODERM ) 5 % Place 1 patch onto the skin daily. Remove & Discard patch within 12 hours or as directed by MD 14 patch 0   Multiple Vitamins-Minerals (ALIVE WOMENS 50+ GUMMY) CHEW Chew 2 tablets by mouth daily.     Polyethyl Glycol-Propyl Glycol (SYSTANE OP) Place 1 drop into both eyes 2 (two) times daily as needed (dry eyes).     polyethylene glycol (MIRALAX ) 17 g packet Take 17 g by mouth daily. 14 each 0   Current Facility-Administered Medications on File Prior to Visit  Medication Dose Route Frequency Provider Last Rate Last Admin   0.9 %  sodium chloride  infusion  500 mL Intravenous Continuous Avram Sandra BRAVO, MD       "

## 2024-06-24 NOTE — Progress Notes (Signed)
 Internal Medicine Clinic Attending  Case discussed with the resident at the time of the visit.  We reviewed the resident's history and exam and pertinent patient test results.  I agree with the assessment, diagnosis, and plan of care documented in the resident's note.

## 2024-06-25 LAB — COMPREHENSIVE METABOLIC PANEL WITH GFR
ALT: 30 IU/L (ref 0–32)
AST: 38 IU/L (ref 0–40)
Albumin: 3.9 g/dL (ref 3.8–4.8)
Alkaline Phosphatase: 94 IU/L (ref 49–135)
BUN/Creatinine Ratio: 11 — ABNORMAL LOW (ref 12–28)
BUN: 12 mg/dL (ref 8–27)
Bilirubin Total: 0.4 mg/dL (ref 0.0–1.2)
CO2: 25 mmol/L (ref 20–29)
Calcium: 9.2 mg/dL (ref 8.7–10.3)
Chloride: 101 mmol/L (ref 96–106)
Creatinine, Ser: 1.1 mg/dL — ABNORMAL HIGH (ref 0.57–1.00)
Globulin, Total: 3 g/dL (ref 1.5–4.5)
Glucose: 109 mg/dL — ABNORMAL HIGH (ref 70–99)
Potassium: 4 mmol/L (ref 3.5–5.2)
Sodium: 141 mmol/L (ref 134–144)
Total Protein: 6.9 g/dL (ref 6.0–8.5)
eGFR: 54 mL/min/1.73 — ABNORMAL LOW

## 2024-06-25 LAB — LIPID PANEL
Chol/HDL Ratio: 2.9 ratio (ref 0.0–4.4)
Cholesterol, Total: 157 mg/dL (ref 100–199)
HDL: 54 mg/dL
LDL Chol Calc (NIH): 85 mg/dL (ref 0–99)
Triglycerides: 96 mg/dL (ref 0–149)
VLDL Cholesterol Cal: 18 mg/dL (ref 5–40)

## 2024-06-25 LAB — HEMOGLOBIN A1C
Est. average glucose Bld gHb Est-mCnc: 134 mg/dL
Hgb A1c MFr Bld: 6.3 % — ABNORMAL HIGH (ref 4.8–5.6)

## 2024-07-16 ENCOUNTER — Inpatient Hospital Stay: Admitting: Gynecologic Oncology
